# Patient Record
Sex: Female | Born: 1935
Health system: Southern US, Community
[De-identification: ages and names within clinical notes are randomized; demographics above are authoritative.]

## PROBLEM LIST (undated history)

## (undated) DIAGNOSIS — T8859XA Other complications of anesthesia, initial encounter: Secondary | ICD-10-CM

## (undated) DIAGNOSIS — Z9889 Other specified postprocedural states: Secondary | ICD-10-CM

## (undated) DIAGNOSIS — R112 Nausea with vomiting, unspecified: Secondary | ICD-10-CM

## (undated) DIAGNOSIS — M1990 Unspecified osteoarthritis, unspecified site: Secondary | ICD-10-CM

## (undated) DIAGNOSIS — N390 Urinary tract infection, site not specified: Secondary | ICD-10-CM

## (undated) DIAGNOSIS — K219 Gastro-esophageal reflux disease without esophagitis: Secondary | ICD-10-CM

## (undated) DIAGNOSIS — I10 Essential (primary) hypertension: Secondary | ICD-10-CM

## (undated) DIAGNOSIS — T4145XA Adverse effect of unspecified anesthetic, initial encounter: Secondary | ICD-10-CM

## (undated) DIAGNOSIS — C801 Malignant (primary) neoplasm, unspecified: Secondary | ICD-10-CM

## (undated) DIAGNOSIS — F419 Anxiety disorder, unspecified: Secondary | ICD-10-CM

## (undated) HISTORY — PX: APPENDECTOMY: SHX54

## (undated) HISTORY — PX: FOOT SURGERY: SHX648

## (undated) HISTORY — PX: ANKLE FRACTURE SURGERY: SHX122

---
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
## 1998-03-21 ENCOUNTER — Other Ambulatory Visit: Admission: RE | Admit: 1998-03-21 | Discharge: 1998-03-21 | Payer: Self-pay | Admitting: Gynecology

## 1998-08-13 HISTORY — PX: KNEE ARTHROSCOPY: SUR90

## 1999-03-28 ENCOUNTER — Other Ambulatory Visit: Admission: RE | Admit: 1999-03-28 | Discharge: 1999-03-28 | Payer: Self-pay | Admitting: Gynecology

## 2000-03-14 ENCOUNTER — Other Ambulatory Visit: Admission: RE | Admit: 2000-03-14 | Discharge: 2000-03-14 | Payer: Self-pay | Admitting: Gynecology

## 2000-04-09 ENCOUNTER — Encounter: Admission: RE | Admit: 2000-04-09 | Discharge: 2000-04-09 | Payer: Self-pay | Admitting: Gynecology

## 2000-04-09 ENCOUNTER — Encounter: Payer: Self-pay | Admitting: Gynecology

## 2001-04-10 ENCOUNTER — Other Ambulatory Visit: Admission: RE | Admit: 2001-04-10 | Discharge: 2001-04-10 | Payer: Self-pay | Admitting: Gynecology

## 2001-04-10 ENCOUNTER — Encounter: Admission: RE | Admit: 2001-04-10 | Discharge: 2001-04-10 | Payer: Self-pay | Admitting: Gynecology

## 2001-04-10 ENCOUNTER — Encounter: Payer: Self-pay | Admitting: Gynecology

## 2002-04-22 ENCOUNTER — Other Ambulatory Visit: Admission: RE | Admit: 2002-04-22 | Discharge: 2002-04-22 | Payer: Self-pay | Admitting: Gynecology

## 2002-04-22 ENCOUNTER — Encounter: Admission: RE | Admit: 2002-04-22 | Discharge: 2002-04-22 | Payer: Self-pay | Admitting: Gynecology

## 2002-04-22 ENCOUNTER — Encounter: Payer: Self-pay | Admitting: Gynecology

## 2002-12-06 ENCOUNTER — Inpatient Hospital Stay (HOSPITAL_COMMUNITY): Admission: EM | Admit: 2002-12-06 | Discharge: 2002-12-11 | Payer: Self-pay | Admitting: Emergency Medicine

## 2002-12-06 ENCOUNTER — Encounter: Payer: Self-pay | Admitting: Emergency Medicine

## 2002-12-07 ENCOUNTER — Encounter: Payer: Self-pay | Admitting: Thoracic Surgery (Cardiothoracic Vascular Surgery)

## 2002-12-07 ENCOUNTER — Encounter: Payer: Self-pay | Admitting: Surgery

## 2002-12-08 ENCOUNTER — Encounter: Payer: Self-pay | Admitting: Thoracic Surgery (Cardiothoracic Vascular Surgery)

## 2002-12-08 ENCOUNTER — Encounter: Payer: Self-pay | Admitting: Surgery

## 2002-12-09 ENCOUNTER — Encounter: Payer: Self-pay | Admitting: Surgery

## 2002-12-10 ENCOUNTER — Encounter: Payer: Self-pay | Admitting: Surgery

## 2002-12-29 ENCOUNTER — Encounter: Payer: Self-pay | Admitting: Surgery

## 2002-12-29 ENCOUNTER — Encounter: Admission: RE | Admit: 2002-12-29 | Discharge: 2002-12-29 | Payer: Self-pay | Admitting: Surgery

## 2003-04-26 ENCOUNTER — Encounter: Payer: Self-pay | Admitting: Gynecology

## 2003-04-26 ENCOUNTER — Encounter: Admission: RE | Admit: 2003-04-26 | Discharge: 2003-04-26 | Payer: Self-pay | Admitting: Gynecology

## 2004-04-26 ENCOUNTER — Encounter: Admission: RE | Admit: 2004-04-26 | Discharge: 2004-04-26 | Payer: Self-pay | Admitting: Gynecology

## 2004-05-18 ENCOUNTER — Other Ambulatory Visit: Admission: RE | Admit: 2004-05-18 | Discharge: 2004-05-18 | Payer: Self-pay | Admitting: Gynecology

## 2005-06-07 ENCOUNTER — Encounter: Admission: RE | Admit: 2005-06-07 | Discharge: 2005-06-07 | Payer: Self-pay | Admitting: Gynecology

## 2006-06-11 ENCOUNTER — Encounter: Admission: RE | Admit: 2006-06-11 | Discharge: 2006-06-11 | Payer: Self-pay | Admitting: Gynecology

## 2006-12-17 ENCOUNTER — Emergency Department (HOSPITAL_COMMUNITY): Admission: EM | Admit: 2006-12-17 | Discharge: 2006-12-17 | Payer: Self-pay | Admitting: Emergency Medicine

## 2007-06-16 ENCOUNTER — Encounter: Admission: RE | Admit: 2007-06-16 | Discharge: 2007-06-16 | Payer: Self-pay | Admitting: Gynecology

## 2007-06-23 ENCOUNTER — Other Ambulatory Visit: Admission: RE | Admit: 2007-06-23 | Discharge: 2007-06-23 | Payer: Self-pay | Admitting: Gynecology

## 2007-12-05 ENCOUNTER — Encounter: Admission: RE | Admit: 2007-12-05 | Discharge: 2007-12-05 | Payer: Self-pay | Admitting: Orthopedic Surgery

## 2007-12-09 ENCOUNTER — Ambulatory Visit (HOSPITAL_BASED_OUTPATIENT_CLINIC_OR_DEPARTMENT_OTHER): Admission: RE | Admit: 2007-12-09 | Discharge: 2007-12-09 | Payer: Self-pay | Admitting: Orthopedic Surgery

## 2008-10-20 ENCOUNTER — Encounter: Admission: RE | Admit: 2008-10-20 | Discharge: 2008-10-20 | Payer: Self-pay | Admitting: Gynecology

## 2008-12-13 ENCOUNTER — Encounter: Admission: RE | Admit: 2008-12-13 | Discharge: 2008-12-13 | Payer: Self-pay | Admitting: Orthopedic Surgery

## 2009-08-13 HISTORY — PX: BREAST SURGERY: SHX581

## 2009-10-28 ENCOUNTER — Encounter: Admission: RE | Admit: 2009-10-28 | Discharge: 2009-10-28 | Payer: Self-pay | Admitting: Internal Medicine

## 2009-11-02 ENCOUNTER — Encounter: Admission: RE | Admit: 2009-11-02 | Discharge: 2009-11-02 | Payer: Self-pay | Admitting: Internal Medicine

## 2010-04-20 ENCOUNTER — Encounter: Admission: RE | Admit: 2010-04-20 | Discharge: 2010-04-20 | Payer: Self-pay | Admitting: Internal Medicine

## 2010-05-11 ENCOUNTER — Encounter: Admission: RE | Admit: 2010-05-11 | Discharge: 2010-05-11 | Payer: Self-pay | Admitting: General Surgery

## 2010-05-15 ENCOUNTER — Ambulatory Visit (HOSPITAL_BASED_OUTPATIENT_CLINIC_OR_DEPARTMENT_OTHER): Admission: RE | Admit: 2010-05-15 | Discharge: 2010-05-15 | Payer: Self-pay | Admitting: General Surgery

## 2010-05-15 ENCOUNTER — Encounter: Admission: RE | Admit: 2010-05-15 | Discharge: 2010-05-15 | Payer: Self-pay | Admitting: General Surgery

## 2010-06-03 ENCOUNTER — Emergency Department (HOSPITAL_COMMUNITY): Admission: EM | Admit: 2010-06-03 | Discharge: 2010-06-03 | Payer: Self-pay | Admitting: Emergency Medicine

## 2010-10-25 LAB — URINALYSIS, ROUTINE W REFLEX MICROSCOPIC
Bilirubin Urine: NEGATIVE
Ketones, ur: NEGATIVE mg/dL
Nitrite: NEGATIVE
Urobilinogen, UA: 0.2 mg/dL (ref 0.0–1.0)

## 2010-10-25 LAB — CBC
HCT: 39 % (ref 36.0–46.0)
MCHC: 33.8 g/dL (ref 30.0–36.0)
MCV: 90.1 fL (ref 78.0–100.0)
RDW: 12.8 % (ref 11.5–15.5)

## 2010-10-25 LAB — POCT I-STAT, CHEM 8
BUN: 13 mg/dL (ref 6–23)
Calcium, Ion: 1.15 mmol/L (ref 1.12–1.32)
Chloride: 100 mEq/L (ref 96–112)
Glucose, Bld: 115 mg/dL — ABNORMAL HIGH (ref 70–99)

## 2010-10-25 LAB — DIFFERENTIAL
Basophils Absolute: 0 10*3/uL (ref 0.0–0.1)
Basophils Relative: 0 % (ref 0–1)
Eosinophils Relative: 1 % (ref 0–5)
Monocytes Absolute: 0.7 10*3/uL (ref 0.1–1.0)

## 2010-10-25 LAB — URINE MICROSCOPIC-ADD ON

## 2010-10-26 LAB — COMPREHENSIVE METABOLIC PANEL
ALT: 16 U/L (ref 0–35)
Alkaline Phosphatase: 38 U/L — ABNORMAL LOW (ref 39–117)
CO2: 25 mEq/L (ref 19–32)
Chloride: 108 mEq/L (ref 96–112)
GFR calc non Af Amer: 45 mL/min — ABNORMAL LOW (ref >60)
Glucose, Bld: 76 mg/dL (ref 70–99)
Potassium: 4.1 mEq/L (ref 3.5–5.1)
Sodium: 139 mEq/L (ref 135–145)
Total Bilirubin: 0.8 mg/dL (ref 0.3–1.2)

## 2010-10-26 LAB — URINALYSIS, ROUTINE W REFLEX MICROSCOPIC
Glucose, UA: NEGATIVE mg/dL
Ketones, ur: NEGATIVE mg/dL
pH: 6 (ref 5.0–8.0)

## 2010-10-26 LAB — CBC
HCT: 37.2 % (ref 36.0–46.0)
Hemoglobin: 12.6 g/dL (ref 12.0–15.0)
MCHC: 33.9 g/dL (ref 30.0–36.0)
RBC: 4.06 MIL/uL (ref 3.87–5.11)

## 2010-10-26 LAB — DIFFERENTIAL
Basophils Relative: 1 % (ref 0–1)
Eosinophils Absolute: 0.2 10*3/uL (ref 0.0–0.7)
Eosinophils Relative: 3 % (ref 0–5)
Neutrophils Relative %: 60 % (ref 43–77)

## 2010-11-06 ENCOUNTER — Other Ambulatory Visit: Payer: Self-pay | Admitting: Internal Medicine

## 2010-11-06 DIAGNOSIS — Z9889 Other specified postprocedural states: Secondary | ICD-10-CM

## 2010-11-06 DIAGNOSIS — Z1231 Encounter for screening mammogram for malignant neoplasm of breast: Secondary | ICD-10-CM

## 2010-11-21 ENCOUNTER — Ambulatory Visit
Admission: RE | Admit: 2010-11-21 | Discharge: 2010-11-21 | Disposition: A | Discharge status: Home / Self Care | Payer: Medicare Other | Source: Ambulatory Visit | Attending: Internal Medicine | Admitting: Internal Medicine

## 2010-11-21 DIAGNOSIS — Z1231 Encounter for screening mammogram for malignant neoplasm of breast: Secondary | ICD-10-CM

## 2010-12-26 NOTE — Op Note (Signed)
NAMESELENE, PELTZER                 ACCOUNT NO.:  [id number]   MEDICAL RECORD NO.:  [id number]          PATIENT TYPE:  AMB   LOCATION:  DSC                          FACILITY:  [facility]   PHYSICIAN:  Leonides Grills, M.D.     DATE OF BIRTH:  1933-03-13   DATE OF PROCEDURE:  12/09/2007  DATE OF DISCHARGE:                               OPERATIVE REPORT   PREOPERATIVE DIAGNOSIS:  Right hallux valgus.   POSTOPERATIVE DIAGNOSIS:  Right hallux valgus.   OPERATION:  1. Right Chevron bunionectomy.  2. Right great toe digital nerve neurolysis.  3. Stress x-rays, right foot.   ANESTHESIA:  General.   SURGEON:  Leonides Grills, MD   ASSISTANT:  Richardean Canal, PA-C   ESTIMATED BLOOD LOSS:  Minimal.   TOURNIQUET TIME:  Approximately half hour.   COMPLICATIONS:  None.   DISPOSITION:  Stable to PR.   INDICATIONS:  This is a 77 year old female who has had longstanding  right bunion pain that was interfering with her life to the point that  she cannot do what she wants to do.  She was discussed the above  procedure.  All risks of infection, neurovascular injury, nonunion,  malunion, hardware reduction or failure, stiffness, arthritis,  persistent pain, worsening pain, prolonged recovery, recurrence of  hallux valgus, and development of hallux varus were all explained.  Questions were encouraged and answered.   OPERATION:  The patient was brought to the operating room and placed in  the supine position.  After adequate general endotracheal tube  anesthesia was administered as well as Ancef 1 g IV piggyback.  Right  lower extremity was prepped and draped in sterile manner.  Over  proximally placed a thigh tourniquet, limbus gravity was exsanguinated,  it was elevated to 90 mmHg.  A longitudinal incision was made over the  medial aspect midline over the right great toe and MTP joint was then  made.  Dissection was carried down through the skin.  Hemostasis was  obtained.  The dorsomedial digital  nerve was carefully dissected out and  a formal digital nerve neurolysis was then performed.  Once this was  mobilized out of harm's way, a L-shaped capsulotomy was then made.  A  simple bunionectomy was then performed with a sagittal saw.  __________  Johnson's ridge was then rounded off with a rongeur.  Lateral capsule  was then released with curved Beaver blade.  This had excellent release  of tight capsule.  Soft tissue was then elevated and protected both  superiorly and inferiorly maintaining the dorsal blood supply to the  metatarsal head.  The center of the head was then identified and a  Chevron osteotomy was created with sagittal saw.  The head was then  translated approximately 3 mm laterally and fixed with a 2.0-mm fully  threaded cortical set screw and a 1.5-mm hole respectively.  This had  excellent purchase and maintenance of the correction.  This was  countersunk.  The redundant bone medially was then trimmed off with the  sagittal saw.  The joint area was copiously irrigated with normal  saline.  Capsule was reconstructed and advanced both superiorly and  proximally using 2-0 Vicryl stitch.  This had an Conservation officer, historic buildings.  The area was copiously irrigated with normal saline.  Tourniquet was  inflated.  Final stress x-ray obtained in AP and lateral planes showed  no gross motion, fixation of proper position with excellent alignment as  well.  Sesamoid was located and alignment was excellent.  The area was  copiously irrigated with normal saline.  Skin was closed with 4-0 nylon  stitch.  Sterile dressing was applied.  Roger-Mann dressing was applied.  Hard sole shoe was applied.  The patient was stable to the PR.      Leonides Grills, M.D.  Electronically Signed     PB/MEDQ  D:  12/09/2007  T:  12/09/2007  Job:  [id number]

## 2011-01-01 IMAGING — CR DG CHEST 2V
2 series · 2 of 2 positions shown · non-contrast
Comparison: 12/05/2007

CLINICAL DATA: Left breast mass, preop.  Hypertension.

CHEST - 2 VIEW

[view not recorded (1 of 2)]
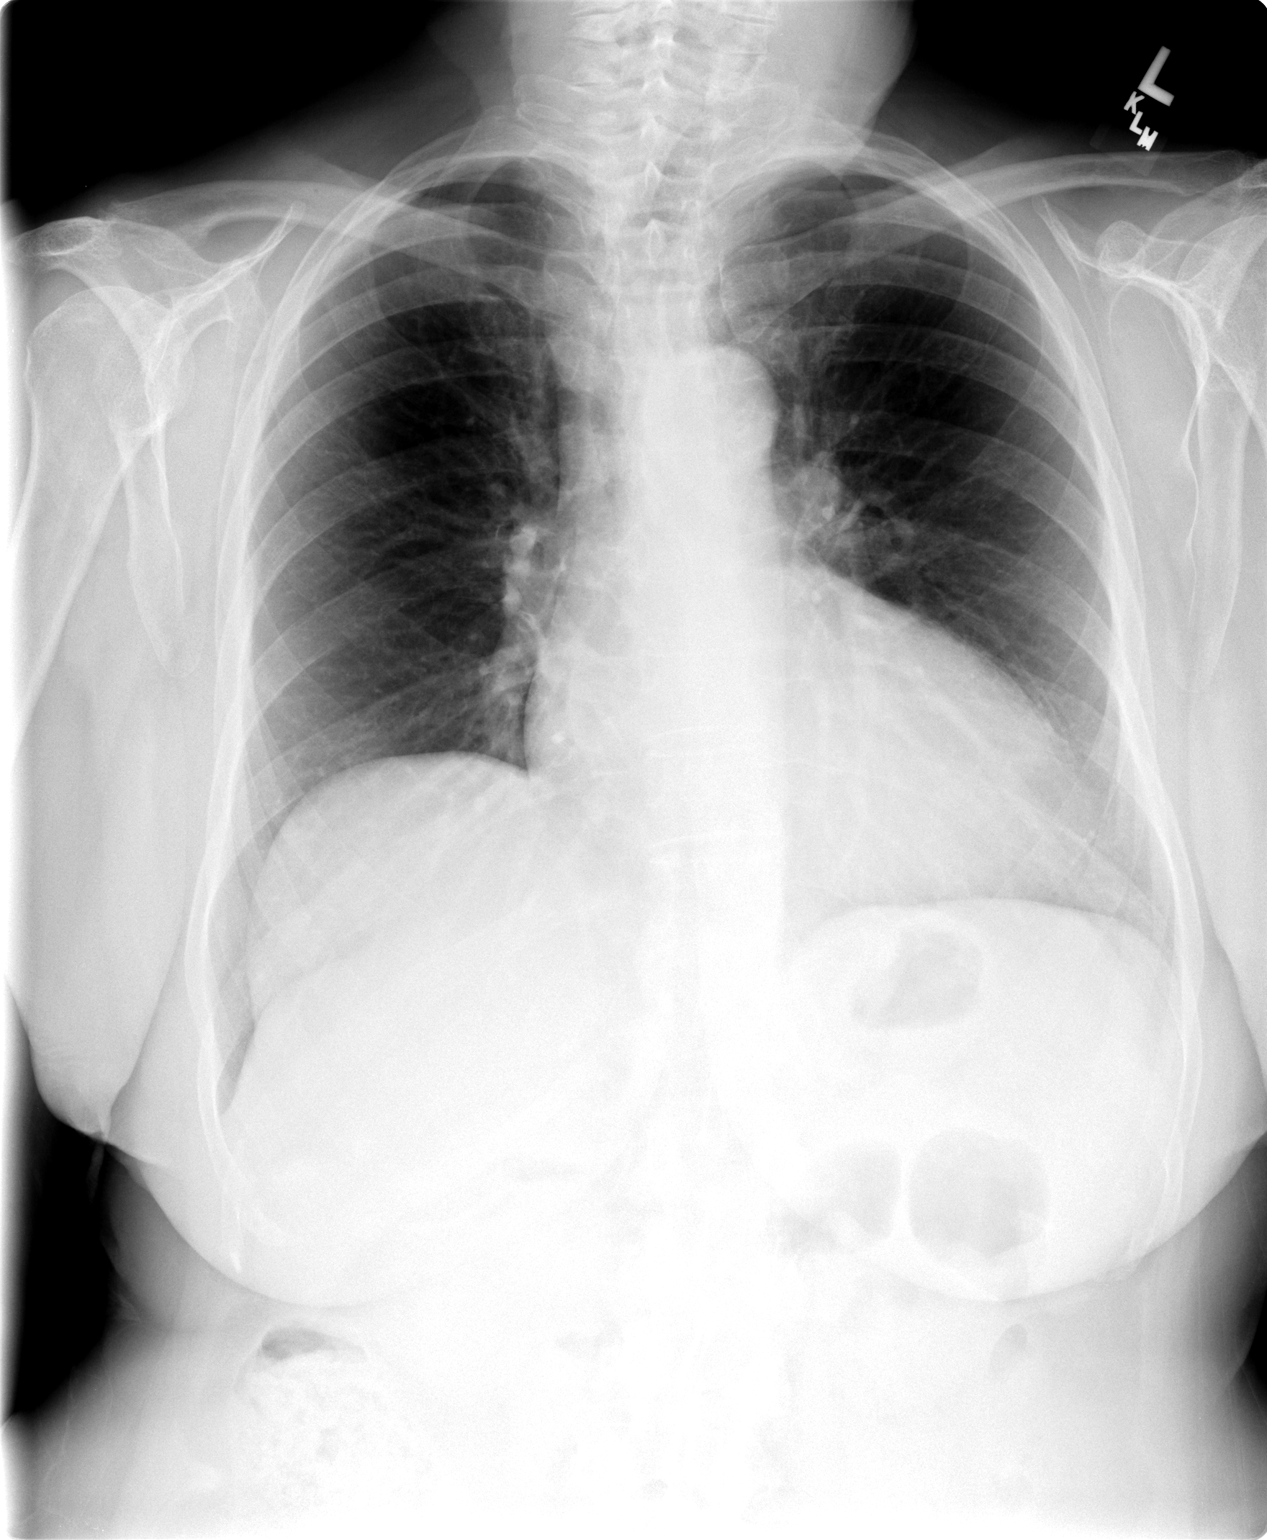

[view not recorded (2 of 2)]
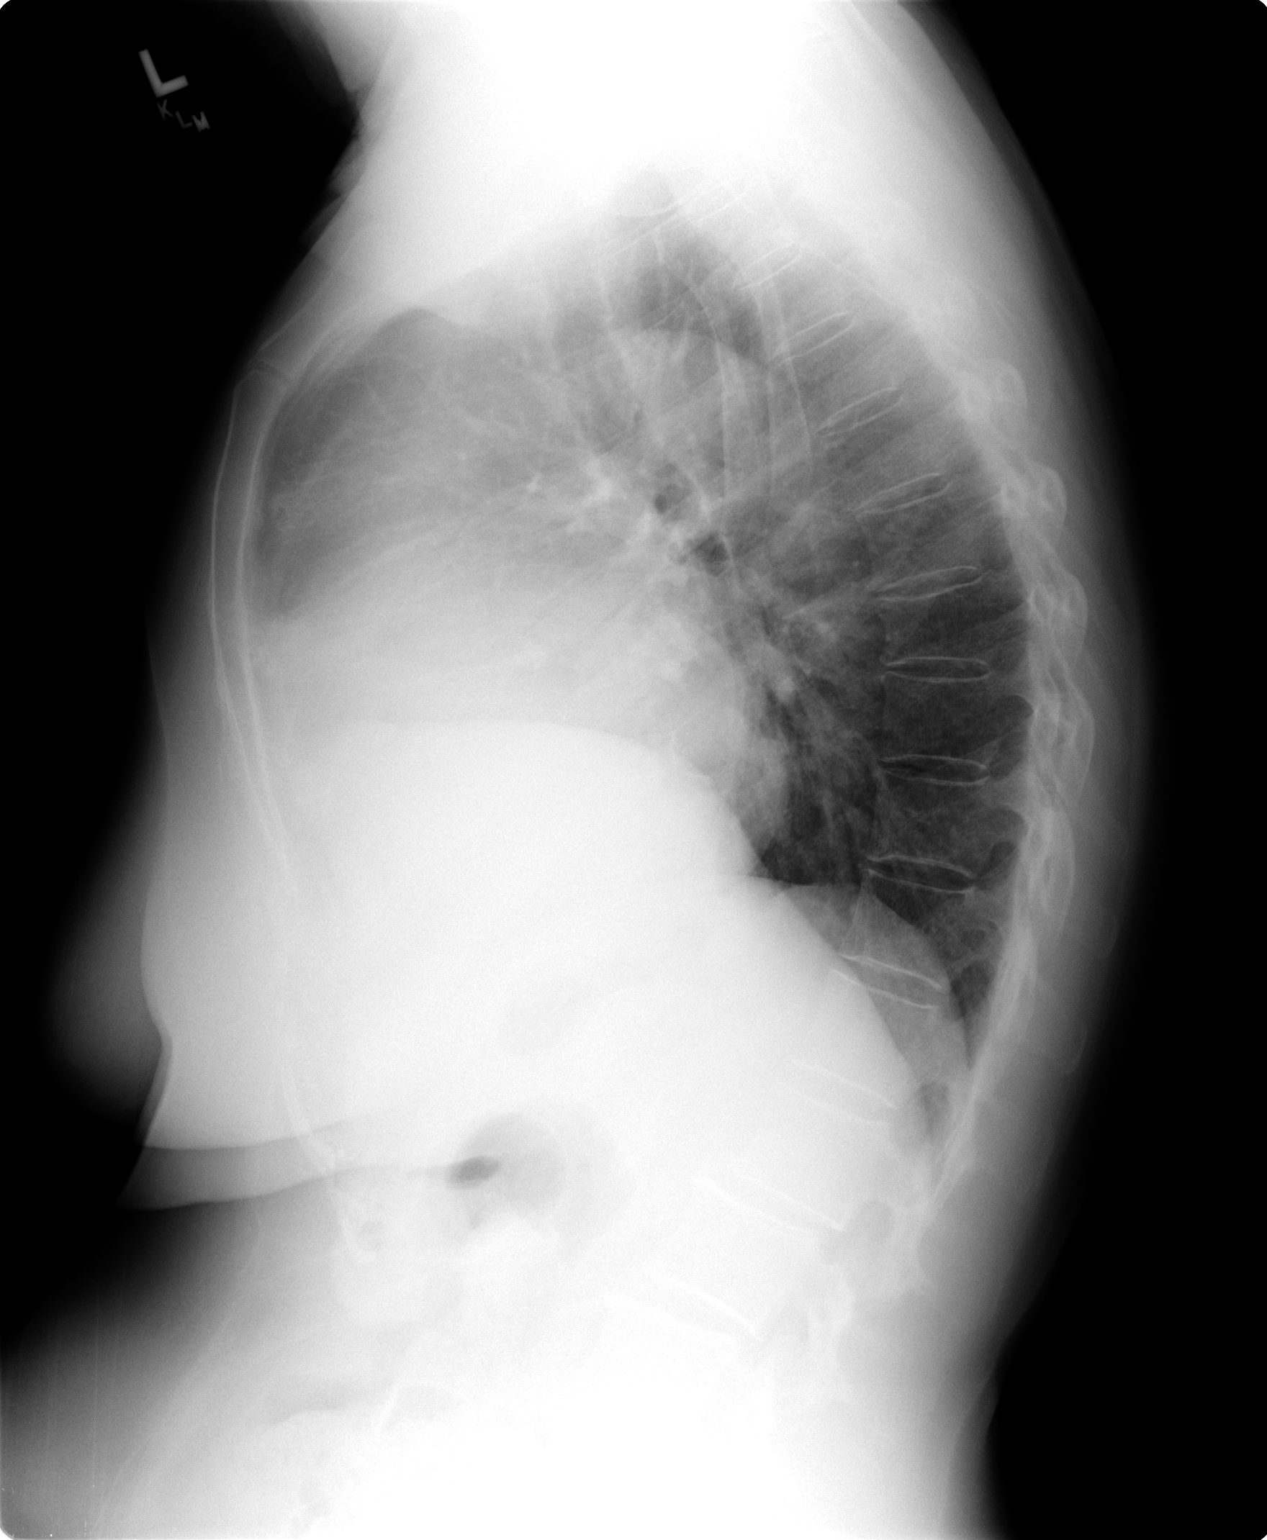

[2 of 2 positions shown; findings below may reference images not displayed]

FINDINGS: There is mild cardiomegaly.  Stable elevation of the
right hemidiaphragm.  Lungs are clear.  No effusions or acute bony
abnormality.
IMPRESSION: No acute cardiopulmonary disease.  Mild cardiomegaly.

## 2011-05-08 LAB — POCT I-STAT, CHEM 8
Calcium, Ion: 1 — ABNORMAL LOW
Chloride: 108
HCT: 41
Hemoglobin: 13.9

## 2011-05-08 LAB — POCT HEMOGLOBIN-HEMACUE: Hemoglobin: 13.6

## 2012-01-18 ENCOUNTER — Other Ambulatory Visit: Payer: Self-pay | Admitting: Internal Medicine

## 2012-01-18 DIAGNOSIS — Z1231 Encounter for screening mammogram for malignant neoplasm of breast: Secondary | ICD-10-CM

## 2012-01-30 ENCOUNTER — Ambulatory Visit
Admission: RE | Admit: 2012-01-30 | Discharge: 2012-01-30 | Disposition: A | Discharge status: Home / Self Care | Payer: Medicare Other | Source: Ambulatory Visit | Attending: Internal Medicine | Admitting: Internal Medicine

## 2012-01-30 DIAGNOSIS — Z1231 Encounter for screening mammogram for malignant neoplasm of breast: Secondary | ICD-10-CM

## 2013-01-14 ENCOUNTER — Other Ambulatory Visit: Payer: Self-pay

## 2013-01-14 DIAGNOSIS — Z1231 Encounter for screening mammogram for malignant neoplasm of breast: Secondary | ICD-10-CM

## 2013-02-12 ENCOUNTER — Ambulatory Visit
Admission: RE | Admit: 2013-02-12 | Discharge: 2013-02-12 | Disposition: A | Discharge status: Home / Self Care | Payer: Medicare Other | Source: Ambulatory Visit

## 2013-02-12 DIAGNOSIS — Z1231 Encounter for screening mammogram for malignant neoplasm of breast: Secondary | ICD-10-CM

## 2013-02-17 ENCOUNTER — Other Ambulatory Visit: Payer: Self-pay | Admitting: *Deleted

## 2013-02-17 ENCOUNTER — Other Ambulatory Visit: Payer: Self-pay | Admitting: Internal Medicine

## 2013-02-17 DIAGNOSIS — R928 Other abnormal and inconclusive findings on diagnostic imaging of breast: Secondary | ICD-10-CM

## 2013-02-25 ENCOUNTER — Ambulatory Visit
Admission: RE | Admit: 2013-02-25 | Discharge: 2013-02-25 | Disposition: A | Discharge status: Home / Self Care | Payer: Medicare Other | Source: Ambulatory Visit | Attending: Internal Medicine | Admitting: Internal Medicine

## 2013-02-25 DIAGNOSIS — R928 Other abnormal and inconclusive findings on diagnostic imaging of breast: Secondary | ICD-10-CM

## 2013-04-21 DIAGNOSIS — I10 Essential (primary) hypertension: Secondary | ICD-10-CM | POA: Insufficient documentation

## 2013-04-21 HISTORY — DX: Essential (primary) hypertension: I10

## 2013-05-18 ENCOUNTER — Other Ambulatory Visit: Payer: Self-pay | Admitting: Neurological Surgery

## 2013-05-25 ENCOUNTER — Encounter (HOSPITAL_COMMUNITY): Payer: Self-pay | Admitting: Pharmacy Technician

## 2013-05-27 ENCOUNTER — Ambulatory Visit (HOSPITAL_COMMUNITY)
Admission: RE | Admit: 2013-05-27 | Discharge: 2013-05-27 | Disposition: A | Discharge status: Home / Self Care | Payer: Medicare Other | Source: Ambulatory Visit | Attending: Neurological Surgery | Admitting: Neurological Surgery

## 2013-05-27 ENCOUNTER — Encounter (HOSPITAL_COMMUNITY)
Admission: RE | Admit: 2013-05-27 | Discharge: 2013-05-27 | Disposition: A | Discharge status: Home / Self Care | Payer: Medicare Other | Source: Ambulatory Visit | Attending: Neurological Surgery | Admitting: Neurological Surgery

## 2013-05-27 ENCOUNTER — Ambulatory Visit (HOSPITAL_COMMUNITY): Admission: RE | Admit: 2013-05-27 | Payer: Medicare Other | Source: Ambulatory Visit

## 2013-05-27 ENCOUNTER — Encounter (HOSPITAL_COMMUNITY): Payer: Self-pay

## 2013-05-27 DIAGNOSIS — I10 Essential (primary) hypertension: Secondary | ICD-10-CM | POA: Insufficient documentation

## 2013-05-27 DIAGNOSIS — Z01812 Encounter for preprocedural laboratory examination: Secondary | ICD-10-CM | POA: Insufficient documentation

## 2013-05-27 DIAGNOSIS — Z01818 Encounter for other preprocedural examination: Secondary | ICD-10-CM | POA: Insufficient documentation

## 2013-05-27 HISTORY — DX: Anxiety disorder, unspecified: F41.9

## 2013-05-27 HISTORY — DX: Essential (primary) hypertension: I10

## 2013-05-27 HISTORY — DX: Unspecified osteoarthritis, unspecified site: M19.90

## 2013-05-27 HISTORY — DX: Urinary tract infection, site not specified: N39.0

## 2013-05-27 HISTORY — DX: Malignant (primary) neoplasm, unspecified: C80.1

## 2013-05-27 LAB — CBC WITH DIFFERENTIAL/PLATELET
Basophils Absolute: 0 10*3/uL (ref 0.0–0.1)
Basophils Relative: 0 % (ref 0–1)
Hemoglobin: 13.2 g/dL (ref 12.0–15.0)
MCHC: 36 g/dL (ref 30.0–36.0)
Monocytes Relative: 8 % (ref 3–12)
Neutro Abs: 4.3 10*3/uL (ref 1.7–7.7)
Neutrophils Relative %: 58 % (ref 43–77)
Platelets: 171 10*3/uL (ref 150–400)
RDW: 12.2 % (ref 11.5–15.5)

## 2013-05-27 LAB — COMPREHENSIVE METABOLIC PANEL
ALT: 18 U/L (ref 0–35)
AST: 28 U/L (ref 0–37)
Albumin: 3.7 g/dL (ref 3.5–5.2)
Alkaline Phosphatase: 51 U/L (ref 39–117)
Chloride: 102 mEq/L (ref 96–112)
Potassium: 4.1 mEq/L (ref 3.5–5.1)
Sodium: 140 mEq/L (ref 135–145)
Total Bilirubin: 0.7 mg/dL (ref 0.3–1.2)
Total Protein: 7 g/dL (ref 6.0–8.3)

## 2013-05-27 NOTE — Pre-Procedure Instructions (Addendum)
TYLAH MANCILLAS  05/27/2013   Your procedure is scheduled on:  06/04/13  Report to [place] short stay admitting at530 AM.  Call this number if you have problems the morning of surgery: [phone]   Remember:   Do not eat food or drink liquids after midnight.   Take these medicines the morning of surgery with A SIP OF WATER:atenolol, pain med , xanax if needed                 STOP celebrex 05/30/13   Do not wear jewelry, make-up or nail polish.  Do not wear lotions, powders, or perfumes. You may wear deodorant.  Do not shave 48 hours prior to surgery. Men may shave face and neck.  Do not bring valuables to the hospital.  [facility] is not responsible                  for any belongings or valuables.               Contacts, dentures or bridgework may not be worn into surgery.  Leave suitcase in the car. After surgery it may be brought to your room.  For patients admitted to the hospital, discharge time is determined by your                treatment team.               Patients discharged the day of surgery will not be allowed to drive  home.  Name and phone number of your driver:   Special Instructions: Incentive Spirometry - Practice and bring it with you on the day of surgery. Shower using CHG 2 nights before surgery and the night before surgery.  If you shower the day of surgery use CHG.  Use special wash - you have one bottle of CHG for all showers.  You should use approximately 1/3 of the bottle for each shower.   Please read over the following fact sheets that you were given: Pain Booklet, Coughing and Deep Breathing, Blood Transfusion Information, MRSA Information and Surgical Site Infection Prevention

## 2013-05-28 NOTE — Progress Notes (Signed)
Anesthesia Chart Review:  Patient is a 79 year old female scheduled for L4-5 laminectomy on 06/04/13 by Dr. Yetta Barre.  History includes non-smoker, HTN, anxiety, arthritis, skin cancer, UTI. PCP is listed as Dr. Lindwood Qua.  EKG on 05/27/13 showed NSR, LAD, right BBB.  Right BBB is new since 05/11/10. No CV symptoms were documented at her PAT visit. She has no known MI, CHF, or DM history.  Preoperative CXR and labs noted.  She will be evaluated by her assigned anesthesiologist on the day of surgery, but if she remains asymptomatic from a CV standpoint then I would anticipate that she could proceed as planned.  Velna Ochs [facility] Short Stay Center/Anesthesiology Phone [phone] 05/28/2013 10:35 AM

## 2013-06-03 MED ORDER — VANCOMYCIN HCL IN DEXTROSE 1-5 GM/200ML-% IV SOLN
1000.0000 mg | INTRAVENOUS | Status: AC
  Administered 2013-06-04: 1000 mg via INTRAVENOUS

## 2013-06-03 MED ORDER — DEXAMETHASONE SODIUM PHOSPHATE 10 MG/ML IJ SOLN
10.0000 mg | INTRAMUSCULAR | Status: AC
  Administered 2013-06-04: 10 mg via INTRAVENOUS
  Filled 2013-06-03: qty 1

## 2013-06-04 ENCOUNTER — Inpatient Hospital Stay (HOSPITAL_COMMUNITY): Payer: Medicare Other

## 2013-06-04 ENCOUNTER — Inpatient Hospital Stay (HOSPITAL_COMMUNITY)
Admission: RE | Admit: 2013-06-04 | Discharge: 2013-06-05 | DRG: 520 | Disposition: A | Discharge status: Home / Self Care | Payer: Medicare Other | Source: Ambulatory Visit | Attending: Neurological Surgery | Admitting: Neurological Surgery

## 2013-06-04 ENCOUNTER — Encounter (HOSPITAL_COMMUNITY): Payer: Medicare Other | Admitting: Vascular Surgery

## 2013-06-04 ENCOUNTER — Encounter (HOSPITAL_COMMUNITY)
Admission: RE | Disposition: A | Discharge status: Home / Self Care | Payer: Self-pay | Source: Ambulatory Visit | Attending: Neurological Surgery

## 2013-06-04 ENCOUNTER — Inpatient Hospital Stay (HOSPITAL_COMMUNITY): Payer: Medicare Other | Admitting: Certified Registered Nurse Anesthetist

## 2013-06-04 ENCOUNTER — Encounter (HOSPITAL_COMMUNITY): Payer: Self-pay | Admitting: *Deleted

## 2013-06-04 DIAGNOSIS — I10 Essential (primary) hypertension: Secondary | ICD-10-CM | POA: Diagnosis present

## 2013-06-04 DIAGNOSIS — F411 Generalized anxiety disorder: Secondary | ICD-10-CM | POA: Diagnosis present

## 2013-06-04 DIAGNOSIS — I440 Atrioventricular block, first degree: Secondary | ICD-10-CM | POA: Diagnosis present

## 2013-06-04 DIAGNOSIS — Z9889 Other specified postprocedural states: Secondary | ICD-10-CM

## 2013-06-04 DIAGNOSIS — Z87440 Personal history of urinary (tract) infections: Secondary | ICD-10-CM

## 2013-06-04 DIAGNOSIS — Z880 Allergy status to penicillin: Secondary | ICD-10-CM

## 2013-06-04 DIAGNOSIS — G8929 Other chronic pain: Secondary | ICD-10-CM | POA: Diagnosis present

## 2013-06-04 DIAGNOSIS — Z79899 Other long term (current) drug therapy: Secondary | ICD-10-CM

## 2013-06-04 DIAGNOSIS — Z9089 Acquired absence of other organs: Secondary | ICD-10-CM

## 2013-06-04 DIAGNOSIS — M48061 Spinal stenosis, lumbar region without neurogenic claudication: Principal | ICD-10-CM | POA: Diagnosis present

## 2013-06-04 HISTORY — PX: LUMBAR LAMINECTOMY/DECOMPRESSION MICRODISCECTOMY: SHX5026

## 2013-06-04 SURGERY — LUMBAR LAMINECTOMY/DECOMPRESSION MICRODISCECTOMY 1 LEVEL
Anesthesia: General | Site: Back | Wound class: Clean

## 2013-06-04 MED ORDER — LACTATED RINGERS IV SOLN
INTRAVENOUS | Status: DC | PRN
  Administered 2013-06-04 (×2): via INTRAVENOUS

## 2013-06-04 MED ORDER — OXYCODONE HCL 5 MG PO TABS
5.0000 mg | ORAL_TABLET | Freq: Once | ORAL | Status: AC | PRN
  Administered 2013-06-04: 5 mg via ORAL

## 2013-06-04 MED ORDER — SCOPOLAMINE 1 MG/3DAYS TD PT72
MEDICATED_PATCH | TRANSDERMAL | Status: DC | PRN
  Administered 2013-06-04: 1 via TRANSDERMAL

## 2013-06-04 MED ORDER — DEXAMETHASONE SODIUM PHOSPHATE 4 MG/ML IJ SOLN
4.0000 mg | Freq: Four times a day (QID) | INTRAMUSCULAR | Status: DC
  Filled 2013-06-04 (×4): qty 1

## 2013-06-04 MED ORDER — PHENYLEPHRINE HCL 10 MG/ML IJ SOLN
INTRAMUSCULAR | Status: DC | PRN
  Administered 2013-06-04 (×2): 80 ug via INTRAVENOUS
  Administered 2013-06-04: 120 ug via INTRAVENOUS
  Administered 2013-06-04: 80 ug via INTRAVENOUS

## 2013-06-04 MED ORDER — MENTHOL 3 MG MT LOZG
1.0000 | LOZENGE | OROMUCOSAL | Status: DC | PRN
  Administered 2013-06-04: 3 mg via ORAL
  Filled 2013-06-04 (×2): qty 9

## 2013-06-04 MED ORDER — PHENYLEPHRINE HCL 10 MG/ML IJ SOLN
10.0000 mg | INTRAVENOUS | Status: DC | PRN
  Administered 2013-06-04: 10 ug/min via INTRAVENOUS

## 2013-06-04 MED ORDER — SODIUM CHLORIDE 0.9 % IV SOLN
INTRAVENOUS | Status: DC | PRN
  Administered 2013-06-04: 08:00:00 via INTRAVENOUS

## 2013-06-04 MED ORDER — ARTIFICIAL TEARS OP OINT
TOPICAL_OINTMENT | OPHTHALMIC | Status: DC | PRN
  Administered 2013-06-04: 1 via OPHTHALMIC

## 2013-06-04 MED ORDER — DEXAMETHASONE 4 MG PO TABS
4.0000 mg | ORAL_TABLET | Freq: Four times a day (QID) | ORAL | Status: DC
  Administered 2013-06-04 – 2013-06-05 (×4): 4 mg via ORAL
  Filled 2013-06-04 (×6): qty 1

## 2013-06-04 MED ORDER — OXYCODONE HCL 5 MG PO TABS
ORAL_TABLET | ORAL | Status: AC
  Filled 2013-06-04: qty 1

## 2013-06-04 MED ORDER — HYDROXYZINE HCL 10 MG PO TABS
10.0000 mg | ORAL_TABLET | Freq: Every day | ORAL | Status: DC | PRN
  Filled 2013-06-04: qty 1

## 2013-06-04 MED ORDER — HYDROMORPHONE HCL PF 1 MG/ML IJ SOLN
0.2500 mg | INTRAMUSCULAR | Status: DC | PRN
  Administered 2013-06-04 (×2): 0.5 mg via INTRAVENOUS

## 2013-06-04 MED ORDER — POTASSIUM CHLORIDE IN NACL 20-0.9 MEQ/L-% IV SOLN
INTRAVENOUS | Status: DC
  Filled 2013-06-04 (×3): qty 1000

## 2013-06-04 MED ORDER — NEOSTIGMINE METHYLSULFATE 1 MG/ML IJ SOLN
INTRAMUSCULAR | Status: DC | PRN
  Administered 2013-06-04: 3 mg via INTRAVENOUS

## 2013-06-04 MED ORDER — PROMETHAZINE HCL 25 MG/ML IJ SOLN
INTRAMUSCULAR | Status: AC
  Filled 2013-06-04: qty 1

## 2013-06-04 MED ORDER — MIDAZOLAM HCL 5 MG/5ML IJ SOLN
INTRAMUSCULAR | Status: DC | PRN
  Administered 2013-06-04: 2 mg via INTRAVENOUS

## 2013-06-04 MED ORDER — ALPRAZOLAM 0.25 MG PO TABS: 0.2500 mg | ORAL_TABLET | Freq: Every day | ORAL | Status: DC | PRN

## 2013-06-04 MED ORDER — PROMETHAZINE HCL 25 MG/ML IJ SOLN
6.2500 mg | INTRAMUSCULAR | Status: DC | PRN
  Administered 2013-06-04: 6.25 mg via INTRAVENOUS

## 2013-06-04 MED ORDER — HYDROCODONE-ACETAMINOPHEN 5-325 MG PO TABS
1.0000 | ORAL_TABLET | ORAL | Status: DC | PRN
  Administered 2013-06-04 – 2013-06-05 (×4): 1 via ORAL
  Filled 2013-06-04 (×4): qty 1

## 2013-06-04 MED ORDER — ACETAMINOPHEN 325 MG PO TABS: 650.0000 mg | ORAL_TABLET | ORAL | Status: DC | PRN

## 2013-06-04 MED ORDER — SODIUM CHLORIDE 0.9 % IJ SOLN: 3.0000 mL | INTRAMUSCULAR | Status: DC | PRN

## 2013-06-04 MED ORDER — PHENOL 1.4 % MT LIQD: 1.0000 | OROMUCOSAL | Status: DC | PRN

## 2013-06-04 MED ORDER — ONDANSETRON HCL 4 MG/2ML IJ SOLN: 4.0000 mg | INTRAMUSCULAR | Status: DC | PRN

## 2013-06-04 MED ORDER — ONDANSETRON HCL 4 MG/2ML IJ SOLN
INTRAMUSCULAR | Status: DC | PRN
  Administered 2013-06-04: 4 mg via INTRAVENOUS

## 2013-06-04 MED ORDER — ACETAMINOPHEN 650 MG RE SUPP: 650.0000 mg | RECTAL | Status: DC | PRN

## 2013-06-04 MED ORDER — SODIUM CHLORIDE 0.9 % IJ SOLN
3.0000 mL | Freq: Two times a day (BID) | INTRAMUSCULAR | Status: DC
  Administered 2013-06-04 (×2): 3 mL via INTRAVENOUS

## 2013-06-04 MED ORDER — GLYCOPYRROLATE 0.2 MG/ML IJ SOLN
INTRAMUSCULAR | Status: DC | PRN
  Administered 2013-06-04: .6 mg via INTRAVENOUS

## 2013-06-04 MED ORDER — HEMOSTATIC AGENTS (NO CHARGE) OPTIME
TOPICAL | Status: DC | PRN
  Administered 2013-06-04: 1 via TOPICAL

## 2013-06-04 MED ORDER — CELECOXIB 200 MG PO CAPS
200.0000 mg | ORAL_CAPSULE | Freq: Every day | ORAL | Status: DC
  Administered 2013-06-05: 200 mg via ORAL
  Filled 2013-06-04: qty 1

## 2013-06-04 MED ORDER — SENNA 8.6 MG PO TABS
1.0000 | ORAL_TABLET | Freq: Two times a day (BID) | ORAL | Status: DC
  Administered 2013-06-04 – 2013-06-05 (×2): 8.6 mg via ORAL
  Filled 2013-06-04 (×3): qty 1

## 2013-06-04 MED ORDER — TRIAMTERENE-HCTZ 37.5-25 MG PO TABS
1.0000 | ORAL_TABLET | Freq: Every day | ORAL | Status: DC
  Administered 2013-06-05: 1 via ORAL
  Filled 2013-06-04: qty 1

## 2013-06-04 MED ORDER — LIDOCAINE HCL (CARDIAC) 20 MG/ML IV SOLN
INTRAVENOUS | Status: DC | PRN
  Administered 2013-06-04: 40 mg via INTRAVENOUS

## 2013-06-04 MED ORDER — MIDAZOLAM HCL 2 MG/2ML IJ SOLN: 0.5000 mg | Freq: Once | INTRAMUSCULAR | Status: DC | PRN

## 2013-06-04 MED ORDER — SODIUM CHLORIDE 0.9 % IR SOLN
Status: DC | PRN
  Administered 2013-06-04: 08:00:00

## 2013-06-04 MED ORDER — ROCURONIUM BROMIDE 100 MG/10ML IV SOLN
INTRAVENOUS | Status: DC | PRN
  Administered 2013-06-04: 50 mg via INTRAVENOUS

## 2013-06-04 MED ORDER — MEPERIDINE HCL 25 MG/ML IJ SOLN: 6.2500 mg | INTRAMUSCULAR | Status: DC | PRN

## 2013-06-04 MED ORDER — ATENOLOL 25 MG PO TABS
25.0000 mg | ORAL_TABLET | Freq: Every day | ORAL | Status: DC
  Administered 2013-06-05: 25 mg via ORAL
  Filled 2013-06-04: qty 1

## 2013-06-04 MED ORDER — FENTANYL CITRATE 0.05 MG/ML IJ SOLN
INTRAMUSCULAR | Status: DC | PRN
  Administered 2013-06-04: 250 ug via INTRAVENOUS

## 2013-06-04 MED ORDER — THROMBIN 5000 UNITS EX SOLR
CUTANEOUS | Status: DC | PRN
  Administered 2013-06-04 (×2): 5000 [IU] via TOPICAL

## 2013-06-04 MED ORDER — 0.9 % SODIUM CHLORIDE (POUR BTL) OPTIME
TOPICAL | Status: DC | PRN
  Administered 2013-06-04: 1000 mL

## 2013-06-04 MED ORDER — PROPOFOL 10 MG/ML IV BOLUS
INTRAVENOUS | Status: DC | PRN
  Administered 2013-06-04: 10 mg via INTRAVENOUS
  Administered 2013-06-04: 5 mg via INTRAVENOUS

## 2013-06-04 MED ORDER — BUPIVACAINE HCL (PF) 0.25 % IJ SOLN
INTRAMUSCULAR | Status: DC | PRN
  Administered 2013-06-04: 4 mL

## 2013-06-04 MED ORDER — OXYCODONE HCL 5 MG/5ML PO SOLN: 5.0000 mg | Freq: Once | ORAL | Status: AC | PRN

## 2013-06-04 MED ORDER — HYDROMORPHONE HCL PF 1 MG/ML IJ SOLN
INTRAMUSCULAR | Status: AC
  Filled 2013-06-04: qty 1

## 2013-06-04 MED ORDER — SCOPOLAMINE 1 MG/3DAYS TD PT72
MEDICATED_PATCH | TRANSDERMAL | Status: AC
  Filled 2013-06-04: qty 1

## 2013-06-04 SURGICAL SUPPLY — 46 items
APL SKNCLS STERI-STRIP NONHPOA (GAUZE/BANDAGES/DRESSINGS) ×1
BAG DECANTER FOR FLEXI CONT (MISCELLANEOUS) ×2 IMPLANT
BENZOIN TINCTURE PRP APPL 2/3 (GAUZE/BANDAGES/DRESSINGS) ×2 IMPLANT
BUR MATCHSTICK NEURO 3.0 LAGG (BURR) ×2 IMPLANT
CANISTER SUCT 3000ML (MISCELLANEOUS) ×2 IMPLANT
CONT SPEC 4OZ CLIKSEAL STRL BL (MISCELLANEOUS) ×2 IMPLANT
DRAPE LAPAROTOMY 100X72X124 (DRAPES) ×2 IMPLANT
DRAPE MICROSCOPE ZEISS OPMI (DRAPES) ×2 IMPLANT
DRAPE POUCH INSTRU U-SHP 10X18 (DRAPES) ×2 IMPLANT
DRAPE SURG 17X23 STRL (DRAPES) ×2 IMPLANT
DRESSING TELFA 8X3 (GAUZE/BANDAGES/DRESSINGS) ×2 IMPLANT
DRSG OPSITE 4X5.5 SM (GAUZE/BANDAGES/DRESSINGS) ×2 IMPLANT
DRSG OPSITE POSTOP 4X6 (GAUZE/BANDAGES/DRESSINGS) ×1 IMPLANT
DURAPREP 26ML APPLICATOR (WOUND CARE) ×2 IMPLANT
ELECT REM PT RETURN 9FT ADLT (ELECTROSURGICAL) ×2
ELECTRODE REM PT RTRN 9FT ADLT (ELECTROSURGICAL) ×1 IMPLANT
GAUZE SPONGE 4X4 16PLY XRAY LF (GAUZE/BANDAGES/DRESSINGS) IMPLANT
GLOVE BIO SURGEON STRL SZ8 (GLOVE) ×2 IMPLANT
GLOVE BIOGEL PI IND STRL 7.5 (GLOVE) IMPLANT
GLOVE BIOGEL PI INDICATOR 7.5 (GLOVE) ×4
GLOVE SURG SS PI 7.0 STRL IVOR (GLOVE) ×4 IMPLANT
GOWN BRE IMP SLV AUR LG STRL (GOWN DISPOSABLE) IMPLANT
GOWN BRE IMP SLV AUR XL STRL (GOWN DISPOSABLE) ×4 IMPLANT
GOWN STRL REIN 2XL LVL4 (GOWN DISPOSABLE) IMPLANT
HEMOSTAT POWDER KIT SURGIFOAM (HEMOSTASIS) IMPLANT
KIT BASIN OR (CUSTOM PROCEDURE TRAY) ×2 IMPLANT
KIT ROOM TURNOVER OR (KITS) ×2 IMPLANT
NDL HYPO 25X1 1.5 SAFETY (NEEDLE) ×1 IMPLANT
NDL SPNL 20GX3.5 QUINCKE YW (NEEDLE) IMPLANT
NEEDLE HYPO 25X1 1.5 SAFETY (NEEDLE) ×2 IMPLANT
NEEDLE SPNL 20GX3.5 QUINCKE YW (NEEDLE) IMPLANT
NS IRRIG 1000ML POUR BTL (IV SOLUTION) ×2 IMPLANT
PACK LAMINECTOMY NEURO (CUSTOM PROCEDURE TRAY) ×2 IMPLANT
PAD ARMBOARD 7.5X6 YLW CONV (MISCELLANEOUS) ×6 IMPLANT
RUBBERBAND STERILE (MISCELLANEOUS) ×4 IMPLANT
SPONGE SURGIFOAM ABS GEL SZ50 (HEMOSTASIS) ×2 IMPLANT
STRIP CLOSURE SKIN 1/2X4 (GAUZE/BANDAGES/DRESSINGS) ×2 IMPLANT
SUT VIC AB 0 CT1 18XCR BRD8 (SUTURE) ×1 IMPLANT
SUT VIC AB 0 CT1 8-18 (SUTURE) ×2
SUT VIC AB 2-0 CP2 18 (SUTURE) ×2 IMPLANT
SUT VIC AB 3-0 SH 8-18 (SUTURE) ×2 IMPLANT
SYR 20ML ECCENTRIC (SYRINGE) ×2 IMPLANT
TAPE STRIPS DRAPE STRL (GAUZE/BANDAGES/DRESSINGS) ×1 IMPLANT
TOWEL OR 17X24 6PK STRL BLUE (TOWEL DISPOSABLE) ×2 IMPLANT
TOWEL OR 17X26 10 PK STRL BLUE (TOWEL DISPOSABLE) ×2 IMPLANT
WATER STERILE IRR 1000ML POUR (IV SOLUTION) ×2 IMPLANT

## 2013-06-04 NOTE — Anesthesia Postprocedure Evaluation (Signed)
  Anesthesia Post-op Note  Patient: Colleen Watkins  Procedure(s) Performed: Procedure(s) with comments: LUMBAR LAMINECTOMY/DECOMPRESSION MICRODISCECTOMY 1 LEVEL (N/A) - LUMBAR LAMINECTOMY/DECOMPRESSION MICRODISCECTOMY 1 LEVEL  Patient Location: PACU  Anesthesia Type:General  Level of Consciousness: oriented, sedated and responds to stimulation and voice  Airway and Oxygen Therapy: Patient Spontanous Breathing and Patient connected to nasal cannula oxygen  Post-op Pain: none  Post-op Assessment: Post-op Vital signs reviewed, Patient's Cardiovascular Status Stable, Respiratory Function Stable, Patent Airway and Pain level controlled, nausea improved  Post-op Vital Signs: Reviewed and stable  Complications: No apparent anesthesia complications

## 2013-06-04 NOTE — Progress Notes (Signed)
ANTIBIOTIC CONSULT NOTE - INITIAL  Pharmacy Consult for Vancomycin Indication: Post-op px x 1 dose  Allergies  Allergen Reactions  . Penicillins     Lip/ face tingling  . Codeine Rash    Patient Measurements: Height: 5\' 5"  (165.1 cm) Weight: 160 lb 2 oz (72.632 kg) IBW/kg (Calculated) : 57 Adjusted Body Weight:   Vital Signs: Temp: 97.6 F (36.4 C) (10/23 1250) Temp src: Oral (10/23 0605) BP: 145/78 mmHg (10/23 1250) Pulse Rate: 55 (10/23 1250) Intake/Output from previous day:   Intake/Output from this shift: Total I/O In: 1890 [P.O.:240; I.V.:1650] Out: 265 [Urine:250; Blood:15]  Labs: No results found for this basename: WBC, HGB, PLT, LABCREA, CREATININE,  in the last 72 hours Estimated Creatinine Clearance: 54.4 ml/min (by C-G formula based on Cr of 0.85). No results found for this basename: VANCOTROUGH, Leodis Binet, VANCORANDOM, GENTTROUGH, GENTPEAK, GENTRANDOM, TOBRATROUGH, TOBRAPEAK, TOBRARND, AMIKACINPEAK, AMIKACINTROU, AMIKACIN,  in the last 72 hours   Microbiology: Recent Results (from the past 720 hour(s))  SURGICAL PCR SCREEN     Status: None   Collection Time    05/27/13  2:46 PM      Result Value Range Status   MRSA, PCR NEGATIVE  NEGATIVE Final   Staphylococcus aureus NEGATIVE  NEGATIVE Final   Comment:            The Xpert SA Assay (FDA     approved for NASAL specimens     in patients over 77 years of age),     is one component of     a comprehensive surveillance     program.  Test performance has     been validated by [employer] for patients greater     than or equal to 29 year old.     It is not intended     to diagnose infection nor to     guide or monitor treatment.    Medical History: Past Medical History  Diagnosis Date  . Hypertension   . Anxiety   . UTI (lower urinary tract infection)   . Cancer     skin  . Arthritis     Medications:  Scheduled:  . [START ON 06/05/2013] atenolol  25 mg Oral Daily  . [START ON  06/05/2013] celecoxib  200 mg Oral Daily  . dexamethasone  4 mg Intravenous Q6H   Or  . dexamethasone  4 mg Oral Q6H  . HYDROmorphone      . oxyCODONE      . promethazine      . scopolamine      . senna  1 tablet Oral BID  . sodium chloride  3 mL Intravenous Q12H  . [START ON 06/05/2013] triamterene-hydrochlorothiazide  1 tablet Oral Daily   Assessment: 79yo female s/p Admit Complaint: S/P Decompressive lumbar laminectomy, medial facetectomy, and foraminotomies L4-5 bilaterally, to receive 1 dose of Vancomycin post-op.  She received Vancomycin 1000mg  IV x 1 this AM at 0745.  Given her CrCl ~81ml/min, this dose should cover her as she would not require another dose until the AM.    1.  No further Vancomycin dosing required  Marisue Humble, PharmD Clinical Pharmacist [facility]

## 2013-06-04 NOTE — Plan of Care (Signed)
Problem: Consults Goal: Diagnosis - Spinal Surgery Outcome: Completed/Met Date Met:  06/04/13 Lumbar Laminectomy (Complex)     

## 2013-06-04 NOTE — Op Note (Signed)
06/04/2013  8:50 AM  PATIENT:  Colleen Watkins  79 y.o. female  PRE-OPERATIVE DIAGNOSIS: Lumbar spinal stenosis L4-5  POST-OPERATIVE DIAGNOSIS:  Same  PROCEDURE:  Decompressive lumbar laminectomy, medial facetectomy, and foraminotomies L4-5 bilaterally  SURGEON:  Marikay Alar, MD  ASSISTANTS: none  ANESTHESIA:   General  EBL: Less than 25 ml  Total I/O In: 1200 [I.V.:1200] Out: 15 [Blood:15]  BLOOD ADMINISTERED:none  DRAINS: None   SPECIMEN:  No Specimen  INDICATION FOR PROCEDURE: Patient presented with a long history of back and bilateral leg pain. She had an MRI which showed moderate spinal stenosis at L4-5 left greater than right. He tried medical management without relief. Recommended a simple decompressive laminectomy at L4-5. Patient understood the risks, benefits, and alternatives and potential outcomes and wished to proceed.  PROCEDURE DETAILS: The patient was taken to the operating room and after induction of adequate generalized endotracheal anesthesia, the patient was rolled into the prone position on the Wilson frame and all pressure points were padded. The lumbar region was cleaned and then prepped with DuraPrep and draped in the usual sterile fashion. 5 cc of local anesthesia was injected and then a dorsal midline incision was made and carried down to the lumbo sacral fascia. The fascia was opened and the paraspinous musculature was taken down in a subperiosteal fashion to expose L4-5 on the left. Intraoperative x-ray confirmed my level, and then I used a combination of the high-speed drill and the Kerrison punches to perform a hemilaminectomy, medial facetectomy, and foraminotomy at L4-5 on the left. The underlying yellow ligament was opened and removed in a piecemeal fashion to expose the underlying dura and exiting nerve root. I undercut the lateral recess and dissected down until I was medial to and distal to the pedicle. The nerve root was well decompressed. I drilled  up under the spinous process and performed a sublaminar decompression to decompress the right lateral recess. I could visualize both L5 nerve root easily.  I then palpated with a coronary dilator along the nerve root bilaterally and into the foramen to assure adequate decompression. I felt no more compression of the nerve root on either side. I irrigated with saline solution containing bacitracin. Achieved hemostasis with bipolar cautery, lined the dura with Gelfoam, and then closed the fascia with 0 Vicryl. I closed the subcutaneous tissues with 2-0 Vicryl and the subcuticular tissues with 3-0 Vicryl. The skin was then closed with benzoin and Steri-Strips. The drapes were removed, a sterile dressing was applied. The patient was awakened from general anesthesia and transferred to the recovery room in stable condition. At the end of the procedure all sponge, needle and instrument counts were correct.   PLAN OF CARE: Admit to inpatient   PATIENT DISPOSITION:  PACU - hemodynamically stable.   Delay start of Pharmacological VTE agent (>24hrs) due to surgical blood loss or risk of bleeding:  yes

## 2013-06-04 NOTE — Preoperative (Signed)
Beta Blockers  Tenormin 25mg s taken @ 4:10 06-04-13

## 2013-06-04 NOTE — Anesthesia Preprocedure Evaluation (Addendum)
Anesthesia Evaluation  Patient identified by MRN, date of birth, ID band Patient awake    Reviewed: Allergy & Precautions, H&P , NPO status , Patient's Chart, lab work & pertinent test results  History of Anesthesia Complications (+) PONV and history of anesthetic complications  Airway Mallampati: II TM Distance: >3 FB Neck ROM: Full    Dental  (+) Teeth Intact and Dental Advisory Given   Pulmonary neg pulmonary ROS,  05-27-13 Chest x-ray  FINDINGS: The heart size and mediastinal contours are within normal limits. Both lungs are clear. The visualized skeletal structures are unremarkable breath sounds clear to auscultation  Pulmonary exam normal       Cardiovascular hypertension, Pt. on medications and Pt. on home beta blockers - anginaRhythm:Regular Rate:Normal  27-May-2013 14:53:12 [facility] ROUTINE RECORD Sinus rhythm with 1st degree A-V block Left axis deviation Right bundle branch block Abnormal ECG RBBB new since 05/11/10   Neuro/Psych Anxiety Chronic back pain: narcotics    GI/Hepatic negative GI ROS, Neg liver ROS,   Endo/Other  negative endocrine ROS  Renal/GU negative Renal ROS     Musculoskeletal   Abdominal   Peds  Hematology negative hematology ROS (+)   Anesthesia Other Findings   Reproductive/Obstetrics                      Anesthesia Physical Anesthesia Plan  ASA: II  Anesthesia Plan: General   Post-op Pain Management:    Induction: Intravenous  Airway Management Planned: Oral ETT  Additional Equipment:   Intra-op Plan:   Post-operative Plan: Extubation in OR  Informed Consent: I have reviewed the patients History and Physical, chart, labs and discussed the procedure including the risks, benefits and alternatives for the proposed anesthesia with the patient or authorized representative who has indicated his/her understanding and acceptance.    Dental advisory given  Plan Discussed with: CRNA and Surgeon  Anesthesia Plan Comments: (Plan routine monitors, GETA)        Anesthesia Quick Evaluation

## 2013-06-04 NOTE — Anesthesia Procedure Notes (Signed)
Procedure Name: Intubation Date/Time: 06/04/2013 7:57 AM Performed by: Tyrone Nine Pre-anesthesia Checklist: Suction available, Patient being monitored, Emergency Drugs available, Patient identified and Timeout performed Patient Re-evaluated:Patient Re-evaluated prior to inductionOxygen Delivery Method: Circle system utilized Preoxygenation: Pre-oxygenation with 100% oxygen Intubation Type: IV induction Ventilation: Mask ventilation without difficulty and Oral airway inserted - appropriate to patient size Laryngoscope Size: Mac and 3 Grade View: Grade III Tube type: Oral Tube size: 7.5 mm Number of attempts: 2 Airway Equipment and Method: Stylet Placement Confirmation: ETT inserted through vocal cords under direct vision,  breath sounds checked- equal and bilateral,  positive ETCO2 and CO2 detector Secured at: 22 cm Tube secured with: Tape Dental Injury: Teeth and Oropharynx as per pre-operative assessment  Difficulty Due To: Difficult Airway- due to anterior larynx and Difficult Airway- due to limited oral opening

## 2013-06-04 NOTE — H&P (Signed)
Subjective: Patient is a 79 y.o. female admitted for LL L4-5. Onset of symptoms was several months ago, gradually worsening since that time.  The pain is rated severe, and is located at the across the lower back and radiates to legs R>L. The pain is described as aching and occurs intermittently. The symptoms have been progressive. Symptoms are exacerbated by exercise. MRI or CT showed stenosis L4-5.   Past Medical History  Diagnosis Date  . Hypertension   . Anxiety   . UTI (lower urinary tract infection)   . Cancer     skin  . Arthritis     Past Surgical History  Procedure Laterality Date  . Laparotomy  50    intestinal tortion  . Ankle fracture surgery Right     25 yrs ago  . Foot surgery      bunion 20 yrs ago  . Knee arthroscopy Left 2000  . Appendectomy    . Breast surgery Left 2011    removed nodule-benign    Prior to Admission medications   Medication Sig Start Date End Date Taking? Authorizing Provider  ALPRAZolam Prudy Feeler) 0.5 MG tablet Take 0.25 mg by mouth daily as needed for anxiety.   Yes Historical Provider, MD  atenolol (TENORMIN) 25 MG tablet Take 25 mg by mouth daily.   Yes Historical Provider, MD  bisacodyl (DULCOLAX) 5 MG EC tablet Take 5 mg by mouth daily as needed for constipation.   Yes Historical Provider, MD  celecoxib (CELEBREX) 200 MG capsule Take 200 mg by mouth daily.   Yes Historical Provider, MD  Cholecalciferol (VITAMIN D PO) Take 723 mg by mouth 3 (three) times a week. Monday, Wednesday, and friday   Yes Historical Provider, MD  HYDROcodone-acetaminophen (NORCO/VICODIN) 5-325 MG per tablet Take 0.5 tablets by mouth every 6 (six) hours as needed for pain.   Yes Historical Provider, MD  hydrocortisone (ANUSOL-HC) 25 MG suppository Place 25 mg rectally 2 (two) times daily as needed for hemorrhoids.   Yes Historical Provider, MD  triamterene-hydrochlorothiazide (MAXZIDE-25) 37.5-25 MG per tablet Take 1 tablet by mouth daily.   Yes Historical Provider, MD   hydrOXYzine (ATARAX/VISTARIL) 10 MG tablet Take 10 mg by mouth daily as needed for itching.    Historical Provider, MD  promethazine (PHENERGAN) 25 MG tablet Take 25 mg by mouth every 6 (six) hours as needed for nausea.    Historical Provider, MD   Allergies  Allergen Reactions  . Penicillins     Lip/ face tingling  . Codeine Rash    History  Substance Use Topics  . Smoking status: Never Smoker   . Smokeless tobacco: Not on file  . Alcohol Use: No    History reviewed. No pertinent family history.   Review of Systems  Positive ROS: neg  All other systems have been reviewed and were otherwise negative with the exception of those mentioned in the HPI and as above.  Objective: Vital signs in last 24 hours: Temp:  [98.2 F (36.8 C)] 98.2 F (36.8 C) (10/23 0605) Pulse Rate:  [64] 64 (10/23 0605) Resp:  [16] 16 (10/23 0605) BP: (193)/(71) 193/71 mmHg (10/23 0605) SpO2:  [98 %] 98 % (10/23 0605)  General Appearance: Alert, cooperative, no distress, appears stated age Head: Normocephalic, without obvious abnormality, atraumatic Eyes: PERRL, conjunctiva/corneas clear, EOM's intact    Neck: Supple, symmetrical, trachea midline Back: Symmetric, no curvature, ROM normal, no CVA tenderness Lungs:  respirations unlabored Heart: Regular rate and rhythm Abdomen: Soft, non-tender Extremities:  Extremities normal, atraumatic, no cyanosis or edema Pulses: 2+ and symmetric all extremities Skin: Skin color, texture, turgor normal, no rashes or lesions  NEUROLOGIC:   Mental status: Alert and oriented x4,  no aphasia, good attention span, fund of knowledge, and memory Motor Exam - grossly normal Sensory Exam - grossly normal Reflexes: 1+ Coordination - grossly normal Gait - grossly normal Balance - grossly normal Cranial Nerves: I: smell Not tested  II: visual acuity  OS: nl    OD: nl  II: visual fields Full to confrontation  II: pupils Equal, round, reactive to light  III,VII:  ptosis None  III,IV,VI: extraocular muscles  Full ROM  V: mastication Normal  V: facial light touch sensation  Normal  V,VII: corneal reflex  Present  VII: facial muscle function - upper  Normal  VII: facial muscle function - lower Normal  VIII: hearing Not tested  IX: soft palate elevation  Normal  IX,X: gag reflex Present  XI: trapezius strength  5/5  XI: sternocleidomastoid strength 5/5  XI: neck flexion strength  5/5  XII: tongue strength  Normal    Data Review Lab Results  Component Value Date   WBC 7.5 05/27/2013   HGB 13.2 05/27/2013   HCT 36.7 05/27/2013   MCV 91.8 05/27/2013   PLT 171 05/27/2013   Lab Results  Component Value Date   NA 140 05/27/2013   K 4.1 05/27/2013   CL 102 05/27/2013   CO2 29 05/27/2013   BUN 20 05/27/2013   CREATININE 0.85 05/27/2013   GLUCOSE 90 05/27/2013   No results found for this basename: INR, PROTIME    Assessment/Plan: Patient admitted for LL L4-5 for stenosis. Patient has failed a reasonable attempt at conservative therapy.  I explained the condition and procedure to the patient and answered any questions.  Patient wishes to proceed with procedure as planned. Understands risks/ benefits and typical outcomes of procedure.   Koda Routon S 06/04/2013 7:40 AM

## 2013-06-04 NOTE — OR Nursing (Signed)
Following end of procedure, in and out catheter placed due to urinary retention. Approximately 250 cc clear, straw colored urine drained. Catheterization performed by Jeralyn Ruths RN.

## 2013-06-04 NOTE — Transfer of Care (Signed)
Immediate Anesthesia Transfer of Care Note  Patient: Colleen Watkins  Procedure(s) Performed: Procedure(s) with comments: LUMBAR LAMINECTOMY/DECOMPRESSION MICRODISCECTOMY 1 LEVEL (N/A) - LUMBAR LAMINECTOMY/DECOMPRESSION MICRODISCECTOMY 1 LEVEL  Patient Location: PACU  Anesthesia Type:General  Level of Consciousness: awake, alert , oriented and patient cooperative  Airway & Oxygen Therapy: Patient Spontanous Breathing and Patient connected to nasal cannula oxygen  Post-op Assessment: Report given to PACU RN and Post -op Vital signs reviewed and stable  Post vital signs: Reviewed and stable  Complications: No apparent anesthesia complications

## 2013-06-05 ENCOUNTER — Encounter (HOSPITAL_COMMUNITY): Payer: Self-pay | Admitting: Neurological Surgery

## 2013-06-05 MED ORDER — HYDROCODONE-ACETAMINOPHEN 5-325 MG PO TABS: 0.5000 | ORAL_TABLET | ORAL | Status: DC | PRN

## 2013-06-05 MED ORDER — VANCOMYCIN HCL IN DEXTROSE 1-5 GM/200ML-% IV SOLN
1000.0000 mg | Freq: Once | INTRAVENOUS | Status: DC
  Filled 2013-06-05: qty 200

## 2013-06-05 NOTE — Progress Notes (Signed)
Pt. Alert and oriented,follows simple instructions, denies pain. Incision area without swelling, redness or S/S of infection. Voiding adequate clear yellow urine. Moving all extremities well and vitals stable and documented. Patient discharged home with family.  Lumbar surgery notes instructions given to patient and family member for home safety and precautions. Pt. and family stated understanding of instructions given 

## 2013-06-05 NOTE — Discharge Summary (Signed)
Physician Discharge Summary  Patient ID: Colleen Watkins MRN: [id number] DOB/AGE: 1933/04/08 79 y.o.  Admit date: 06/04/2013 Discharge date: 06/05/2013  Admission Diagnoses: lumbar stenosis    Discharge Diagnoses: same   Discharged Condition: good  Hospital Course: The patient was admitted on 06/04/2013 and taken to the operating room where the patient underwent lumbar lam L4-5. The patient tolerated the procedure well and was taken to the recovery room and then to the floor in stable condition. The hospital course was routine. There were no complications. The wound remained clean dry and intact. Pt had appropriate back soreness. No complaints of leg pain or new N/T/W. The patient remained afebrile with stable vital signs, and tolerated a regular diet. The patient continued to increase activities, and pain was well controlled with oral pain medications.   Consults: None  Significant Diagnostic Studies:  Results for orders placed during the hospital encounter of 05/27/13  SURGICAL PCR SCREEN      Result Value Range   MRSA, PCR NEGATIVE  NEGATIVE   Staphylococcus aureus NEGATIVE  NEGATIVE  CBC WITH DIFFERENTIAL      Result Value Range   WBC 7.5  4.0 - 10.5 K/uL   RBC 4.00  3.87 - 5.11 MIL/uL   Hemoglobin 13.2  12.0 - 15.0 g/dL   HCT 40.9  81.1 - 91.4 %   MCV 91.8  78.0 - 100.0 fL   MCH 33.0  26.0 - 34.0 pg   MCHC 36.0  30.0 - 36.0 g/dL   RDW 78.2  95.6 - 21.3 %   Platelets 171  150 - 400 K/uL   Neutrophils Relative % 58  43 - 77 %   Neutro Abs 4.3  1.7 - 7.7 K/uL   Lymphocytes Relative 30  12 - 46 %   Lymphs Abs 2.3  0.7 - 4.0 K/uL   Monocytes Relative 8  3 - 12 %   Monocytes Absolute 0.6  0.1 - 1.0 K/uL   Eosinophils Relative 3  0 - 5 %   Eosinophils Absolute 0.3  0.0 - 0.7 K/uL   Basophils Relative 0  0 - 1 %   Basophils Absolute 0.0  0.0 - 0.1 K/uL  COMPREHENSIVE METABOLIC PANEL      Result Value Range   Sodium 140  135 - 145 mEq/L   Potassium 4.1  3.5 - 5.1 mEq/L    Chloride 102  96 - 112 mEq/L   CO2 29  19 - 32 mEq/L   Glucose, Bld 90  70 - 99 mg/dL   BUN 20  6 - 23 mg/dL   Creatinine, Ser 0.86  0.50 - 1.10 mg/dL   Calcium 9.9  8.4 - 57.8 mg/dL   Total Protein 7.0  6.0 - 8.3 g/dL   Albumin 3.7  3.5 - 5.2 g/dL   AST 28  0 - 37 U/L   ALT 18  0 - 35 U/L   Alkaline Phosphatase 51  39 - 117 U/L   Total Bilirubin 0.7  0.3 - 1.2 mg/dL   GFR calc non Af Amer 64 (*) >90 mL/min   GFR calc Af Amer 74 (*) >90 mL/min    Dg Chest 2 View  05/27/2013   CLINICAL DATA:  Hypertension.  EXAM: CHEST  2 VIEW  COMPARISON:  May 11, 2010.  FINDINGS: The heart size and mediastinal contours are within normal limits. Both lungs are clear. The visualized skeletal structures are unremarkable.  IMPRESSION: No active cardiopulmonary disease.   Electronically  Signed   By: Roque Lias M.D.   On: 05/27/2013 15:25   Dg Lumbar Spine 1 View  06/04/2013   CLINICAL DATA:  Surgical localization portable lateral lumbar spine view  EXAM: LUMBAR SPINE - 1 VIEW  COMPARISON:  05/18/2013  FINDINGS: A surgical probe has been inserted between skin retractors. The tip lies posterior to the upper endplate of L4.  IMPRESSION: Surgical localization image. Surgical probe tip lies posterior to the upper endplate of L4.   Electronically Signed   By: Amie Portland M.D.   On: 06/04/2013 10:51    Antibiotics:  Anti-infectives   Start     Dose/Rate Route Frequency Ordered Stop   06/05/13 0230  vancomycin (VANCOCIN) IVPB 1000 mg/200 mL premix  Status:  Discontinued     1,000 mg 200 mL/hr over 60 Minutes Intravenous  Once 06/05/13 0225 06/05/13 0226   06/04/13 0821  bacitracin 50,000 Units in sodium chloride irrigation 0.9 % 500 mL irrigation  Status:  Discontinued       As needed 06/04/13 0821 06/04/13 0911   06/04/13 0600  vancomycin (VANCOCIN) IVPB 1000 mg/200 mL premix     1,000 mg 200 mL/hr over 60 Minutes Intravenous 60 min pre-op 06/03/13 1433 06/04/13 0745      Discharge  Exam: Blood pressure 137/67, pulse 62, temperature 98.6 F (37 C), temperature source Oral, resp. rate 18, height 5\' 5"  (1.651 m), weight 72.632 kg (160 lb 2 oz), SpO2 92.00%. Neurologic: Grossly normal Incision CDI  Discharge Medications:     Medication List         ALPRAZolam 0.5 MG tablet  Commonly known as:  XANAX  Take 0.25 mg by mouth daily as needed for anxiety.     atenolol 25 MG tablet  Commonly known as:  TENORMIN  Take 25 mg by mouth daily.     bisacodyl 5 MG EC tablet  Commonly known as:  DULCOLAX  Take 5 mg by mouth daily as needed for constipation.     celecoxib 200 MG capsule  Commonly known as:  CELEBREX  Take 200 mg by mouth daily.     HYDROcodone-acetaminophen 5-325 MG per tablet  Commonly known as:  NORCO/VICODIN  Take 0.5 tablets by mouth every 4 (four) hours as needed for pain.     hydrocortisone 25 MG suppository  Commonly known as:  ANUSOL-HC  Place 25 mg rectally 2 (two) times daily as needed for hemorrhoids.     hydrOXYzine 10 MG tablet  Commonly known as:  ATARAX/VISTARIL  Take 10 mg by mouth daily as needed for itching.     promethazine 25 MG tablet  Commonly known as:  PHENERGAN  Take 25 mg by mouth every 6 (six) hours as needed for nausea.     triamterene-hydrochlorothiazide 37.5-25 MG per tablet  Commonly known as:  MAXZIDE-25  Take 1 tablet by mouth daily.     VITAMIN D PO  Take 723 mg by mouth 3 (three) times a week. Monday, Wednesday, and friday        Disposition: home   Final Dx: LL L4-5      Discharge Orders   Future Orders Complete By Expires   Call MD for:  difficulty breathing, headache or visual disturbances  As directed    Call MD for:  persistant nausea and vomiting  As directed    Call MD for:  redness, tenderness, or signs of infection (pain, swelling, redness, odor or green/yellow discharge around incision site)  As directed  Call MD for:  severe uncontrolled pain  As directed    Call MD for:  temperature  >100.4  As directed    Diet - low sodium heart healthy  As directed    Discharge instructions  As directed    Comments:     No bending or twisting, no heavy lifting   Increase activity slowly  As directed    Remove dressing in 48 hours  As directed       Follow-up Information   Follow up with Sheryl Towell S, MD. Schedule an appointment as soon as possible for a visit in 2 weeks.   Specialty:  Neurosurgery   Contact information:   [address] [place] [address] [phone]        Signed: Tia Alert 06/05/2013, 9:45 AM

## 2013-10-22 ENCOUNTER — Ambulatory Visit: Payer: Self-pay

## 2013-11-05 ENCOUNTER — Ambulatory Visit: Payer: Self-pay

## 2014-01-26 ENCOUNTER — Other Ambulatory Visit: Payer: Self-pay

## 2014-01-26 DIAGNOSIS — Z1231 Encounter for screening mammogram for malignant neoplasm of breast: Secondary | ICD-10-CM

## 2014-02-22 ENCOUNTER — Ambulatory Visit
Admission: RE | Admit: 2014-02-22 | Discharge: 2014-02-22 | Disposition: A | Discharge status: Home / Self Care | Payer: 59 | Source: Ambulatory Visit

## 2014-02-22 ENCOUNTER — Encounter (INDEPENDENT_AMBULATORY_CARE_PROVIDER_SITE_OTHER): Payer: Self-pay

## 2014-02-22 DIAGNOSIS — Z1231 Encounter for screening mammogram for malignant neoplasm of breast: Secondary | ICD-10-CM

## 2015-01-14 ENCOUNTER — Encounter (HOSPITAL_COMMUNITY): Payer: Self-pay | Admitting: *Deleted

## 2015-01-14 ENCOUNTER — Emergency Department (HOSPITAL_COMMUNITY)
Admission: EM | Admit: 2015-01-14 | Discharge: 2015-01-14 | Disposition: A | Discharge status: Home / Self Care | Payer: Medicare Other | Attending: Emergency Medicine | Admitting: Emergency Medicine

## 2015-01-14 ENCOUNTER — Emergency Department (HOSPITAL_COMMUNITY): Payer: Medicare Other

## 2015-01-14 DIAGNOSIS — Z87440 Personal history of urinary (tract) infections: Secondary | ICD-10-CM | POA: Insufficient documentation

## 2015-01-14 DIAGNOSIS — M1990 Unspecified osteoarthritis, unspecified site: Secondary | ICD-10-CM | POA: Insufficient documentation

## 2015-01-14 DIAGNOSIS — Z79899 Other long term (current) drug therapy: Secondary | ICD-10-CM | POA: Insufficient documentation

## 2015-01-14 DIAGNOSIS — M25552 Pain in left hip: Secondary | ICD-10-CM | POA: Insufficient documentation

## 2015-01-14 DIAGNOSIS — G8929 Other chronic pain: Secondary | ICD-10-CM | POA: Insufficient documentation

## 2015-01-14 DIAGNOSIS — F419 Anxiety disorder, unspecified: Secondary | ICD-10-CM | POA: Diagnosis not present

## 2015-01-14 DIAGNOSIS — Z880 Allergy status to penicillin: Secondary | ICD-10-CM | POA: Insufficient documentation

## 2015-01-14 DIAGNOSIS — M5136 Other intervertebral disc degeneration, lumbar region: Secondary | ICD-10-CM | POA: Insufficient documentation

## 2015-01-14 DIAGNOSIS — M5442 Lumbago with sciatica, left side: Secondary | ICD-10-CM

## 2015-01-14 DIAGNOSIS — I10 Essential (primary) hypertension: Secondary | ICD-10-CM | POA: Insufficient documentation

## 2015-01-14 DIAGNOSIS — M545 Low back pain: Secondary | ICD-10-CM | POA: Diagnosis present

## 2015-01-14 DIAGNOSIS — Z85828 Personal history of other malignant neoplasm of skin: Secondary | ICD-10-CM | POA: Diagnosis not present

## 2015-01-14 LAB — URINALYSIS, ROUTINE W REFLEX MICROSCOPIC
Bilirubin Urine: NEGATIVE
Glucose, UA: NEGATIVE mg/dL
Hgb urine dipstick: NEGATIVE
Ketones, ur: NEGATIVE mg/dL
Nitrite: NEGATIVE
Protein, ur: NEGATIVE mg/dL
Specific Gravity, Urine: 1.019 (ref 1.005–1.030)
Urobilinogen, UA: 0.2 mg/dL (ref 0.0–1.0)
pH: 6 (ref 5.0–8.0)

## 2015-01-14 LAB — URINE MICROSCOPIC-ADD ON

## 2015-01-14 MED ORDER — CEPHALEXIN 500 MG PO CAPS: 500.0000 mg | ORAL_CAPSULE | Freq: Two times a day (BID) | ORAL | Status: DC

## 2015-01-14 MED ORDER — HYDROCODONE-ACETAMINOPHEN 5-325 MG PO TABS: 1.0000 | ORAL_TABLET | Freq: Four times a day (QID) | ORAL | Status: DC | PRN

## 2015-01-14 MED ORDER — CEPHALEXIN 250 MG PO CAPS
500.0000 mg | ORAL_CAPSULE | Freq: Once | ORAL | Status: AC
  Administered 2015-01-14: 500 mg via ORAL
  Filled 2015-01-14: qty 2

## 2015-01-14 NOTE — ED Notes (Signed)
Pt states that she has chronic back pain that extends to her left left with some numbness and tingling in left foot. Pt states that she tried to see her doctor but was not able to get worked in until the end of the month. Pt denies bowel or bladder issues.

## 2015-01-14 NOTE — ED Notes (Signed)
MD at bedside. 

## 2015-01-14 NOTE — ED Provider Notes (Signed)
CSN: [id number]     Arrival date & time 01/14/15  0814 History   First MD Initiated Contact with Patient 01/14/15 [phone]     Chief Complaint  Patient presents with  . Back Pain     (Consider location/radiation/quality/duration/timing/severity/associated sxs/prior Treatment) HPI Pt is a 81yo female with hx of HTN, anxiety, UTI, and arthritis c/o lower back pain that radiates into Left hip and Left leg. Pt is accompanied by her daughter.  Pt states pain has gradually worsened over the last 2 weeks but states yesterday and today have been the worst.  Pain is aching and sore, 8/10, worse with ambulation. She does not use a walker or can at home.  Pt had a lumbar laminectomy/decompression microdiscectomy in 06/04/13 by Dr. Sherley Bounds.  Pt states since then, she has been getting steroid injections which do provide relief for several weeks.  Last injection was 11/11/14.  Pt cannot f/u with Dr. Ronnald Ramp until the end of this month. She denies any recent falls or injuries. She takes Aleve as well as tramadol for pain but states she rarely takes the tramadol because it makes her fall asleep.  Denies fever, n/v/d. Pt does state it is hard to pee at times. Pt states it feels like she has to go but then not much comes out but she will have to pee a lot at night. Pt believes this is due to fluid from her legs. She does have lasix but states she does not take it often as it makes her pee a lot.  Last dose was 1 week ago.  Daughter reports she also does not tender to take her BP medication every day as pt will be feeling well and not take her medications.   Past Medical History  Diagnosis Date  . Hypertension   . Anxiety   . UTI (lower urinary tract infection)   . Cancer     skin  . Arthritis    Past Surgical History  Procedure Laterality Date  . Laparotomy  50    intestinal tortion  . Ankle fracture surgery Right     25 yrs ago  . Foot surgery      bunion 20 yrs ago  . Knee arthroscopy Left 2000  .  Appendectomy    . Breast surgery Left 2011    removed nodule-benign  . Lumbar laminectomy/decompression microdiscectomy N/A 06/04/2013    Procedure: LUMBAR LAMINECTOMY/DECOMPRESSION MICRODISCECTOMY 1 LEVEL;  Surgeon: Eustace Moore, MD;  Location: [facility];  Service: Neurosurgery;  Laterality: N/A;  LUMBAR LAMINECTOMY/DECOMPRESSION MICRODISCECTOMY 1 LEVEL   No family history on file. History  Substance Use Topics  . Smoking status: Never Smoker   . Smokeless tobacco: Not on file  . Alcohol Use: No   OB History    No data available     Review of Systems  Constitutional: Negative for fever, chills, appetite change and fatigue.  Respiratory: Negative for cough and shortness of breath.   Cardiovascular: Positive for leg swelling ( bilaterally (chronic)). Negative for chest pain and palpitations.  Gastrointestinal: Negative for nausea, vomiting, abdominal pain and diarrhea.  Genitourinary: Positive for decreased urine volume. Negative for dysuria, urgency, frequency and hematuria.  Musculoskeletal: Positive for back pain, joint swelling (bilateral feet/ankles (chronic)), arthralgias and gait problem (due to pain). Negative for myalgias, neck pain and neck stiffness.  Skin: Negative for rash and wound.  Neurological: Positive for numbness. Negative for weakness.  All other systems reviewed and are negative.  Allergies  Hydrocortisone; Oxycodone; Penicillins; and Codeine  Home Medications   Prior to Admission medications   Medication Sig Start Date End Date Taking? Authorizing Provider  bisacodyl (DULCOLAX) 5 MG EC tablet Take 5 mg by mouth daily as needed for constipation.   Yes Historical Provider, MD  Cholecalciferol (VITAMIN D PO) Take 723 mg by mouth 3 (three) times a week. Monday, Wednesday, and friday   Yes Historical Provider, MD  furosemide (LASIX) 20 MG tablet Take 20 mg by mouth daily as needed for fluid.   Yes Historical Provider, MD  labetalol (NORMODYNE) 200 MG  tablet Take 200 mg by mouth 2 (two) times daily.   Yes Historical Provider, MD  naproxen sodium (ANAPROX) 220 MG tablet Take 440 mg by mouth 2 (two) times daily with a meal.   Yes Historical Provider, MD  Probiotic Product (ALIGN) 4 MG CAPS Take 4 mg by mouth daily.   Yes Historical Provider, MD  promethazine (PHENERGAN) 25 MG tablet Take 25 mg by mouth every 6 (six) hours as needed for nausea.   Yes Historical Provider, MD  traMADol (ULTRAM) 50 MG tablet Take 50-100 mg by mouth every 6 (six) hours as needed for moderate pain.   Yes Historical Provider, MD  cephALEXin (KEFLEX) 500 MG capsule Take 1 capsule (500 mg total) by mouth 2 (two) times daily. For 10 days 01/14/15   Noland Fordyce, PA-C  HYDROcodone-acetaminophen (NORCO/VICODIN) 5-325 MG per tablet Take 1 tablet by mouth every 6 (six) hours as needed. 01/14/15   Noland Fordyce, PA-C   BP 179/65 mmHg  Pulse 71  Temp(Src) 98.1 F (36.7 C) (Oral)  Resp 16  Ht 5\' 1"  (1.549 m)  Wt 160 lb (72.576 kg)  BMI 30.25 kg/m2  SpO2 96% Physical Exam  Constitutional: She appears well-developed and well-nourished. No distress.  HENT:  Head: Normocephalic and atraumatic.  Eyes: Conjunctivae are normal. No scleral icterus.  Neck: Normal range of motion.  Cardiovascular: Normal rate, regular rhythm and normal heart sounds.   Pulses:      Dorsalis pedis pulses are 2+ on the right side, and 2+ on the left side.       Posterior tibial pulses are 2+ on the right side, and 2+ on the left side.  Pulmonary/Chest: Effort normal and breath sounds normal. No respiratory distress. She has no wheezes. She has no rales. She exhibits no tenderness.  Abdominal: Soft. Bowel sounds are normal. She exhibits no distension and no mass. There is no tenderness. There is no rebound and no guarding.  Genitourinary: Rectum normal. Rectal exam shows no tenderness and anal tone normal.  Chaperoned exam. DRE: normal sphincter tone. Soft Kluesner stool on exam glove.  Musculoskeletal:  Normal range of motion. She exhibits edema.  Well healed surgical scars over lumbar spine. Tenderness over Lumbar spine, Left lumbar muscles, Left buttock, and Left hip w/o crepitus. No spinal step-offs. Bilateral ankle swelling, calves soft, non-tender. Increased pain with Left straight leg raise. 4/5 strength with Left straight leg raise compared to Right due to pain. 5/5 plantar flexion bilaterally.   Neurological: She is alert.  Sensation in tact and symmetric in bilateral legs.  Skin: Skin is warm and dry. No rash noted. She is not diaphoretic. No erythema.  Nursing note and vitals reviewed.   ED Course  Procedures (including critical care time) Labs Review Labs Reviewed  URINALYSIS, ROUTINE W REFLEX MICROSCOPIC (NOT AT [facility]) - Abnormal; Notable for the following:    APPearance CLOUDY (*)  Leukocytes, UA LARGE (*)    All other components within normal limits  URINE MICROSCOPIC-ADD ON - Abnormal; Notable for the following:    Squamous Epithelial / LPF FEW (*)    Bacteria, UA FEW (*)    All other components within normal limits  URINE CULTURE    Imaging Review Dg Lumbar Spine Complete  01/14/2015   CLINICAL DATA:  Left-sided low back pain and left hip pain radiating to the left leg. Duration of symptoms 2 years, but worsening.  EXAM: LUMBAR SPINE - COMPLETE 4+ VIEW  COMPARISON:  CT abdomen 11/03/2013. Previous lumbar radiographs 05/18/2013. MRI lumbar spine 01/01/2013.  FINDINGS: There is thoracolumbar curvature convex to the left with the apex at L2. At L1-2, there is retrolisthesis of 3 mm. There is disc space narrowing. At L2-3, there has been considerable worsening of degenerative disc disease. There is retrolisthesis of 5 mm. There is near complete loss of disc space height with bony endplate changes, more severe on the right. L3-4 shows moderate disc space narrowing. L4-5 shows disc space narrowing on the left. L5-S1 disc height is normal. There is facet arthropathy bilaterally at  L3-4, L4-5 and L5-S1 but no slippage at those levels.  IMPRESSION: Thoracolumbar curvature convex to the left.  Marked progression of degenerative disc disease at L2-3 when compared to the study of 2014, more severe on the right.  Chronic lower lumbar degenerative disc disease and degenerative facet disease which could be symptomatic.   Electronically Signed   By: Nelson Chimes M.D.   On: 01/14/2015 09:07   Dg Hip Unilat With Pelvis 2-3 Views Left  01/14/2015   CLINICAL DATA:  Initial encounter for 2 year history of left lower lumbar pain radiating into the left hip.  EXAM: LEFT HIP (WITH PELVIS) 2-3 VIEWS  COMPARISON:  None.  FINDINGS: Frontal pelvis shows bony demineralization without fracture. SI joints and symphysis pubis are unremarkable. Hypertrophic spurring is seen in the roof of the left acetabulum and along the left femoral head. No worrisome lytic or sclerotic osseous abnormality.  IMPRESSION: Degenerative changes are seen in the left hip.   Electronically Signed   By: Misty Stanley M.D.   On: 01/14/2015 09:07     EKG Interpretation None      MDM   Final diagnoses:  Left hip pain  Left-sided low back pain with left-sided sciatica  DDD (degenerative disc disease), lumbar    Pt is a 81yo female with hx of chronic lower back pain, managed by Dr. Ronnald Ramp, neurosurgery, with cortisone injections. Last injection was 11/11/14.  Pt cannot f/u with Dr. Ronnald Ramp until end of the month. Denies new falls or injuries. Pain gradually worsening.  Pt does report decreased urine during the day but gets up often to urinate at night. Pt thinks this is due to fluid in her legs.  Takes lasix "as needed" last dose 1 week ago. Denies CP or SOB.  Pt does have bilateral leg swelling but states she had normal venous U/S 6 months ago for same swelling.   On exam, pt is tender along lumbar spine, Left lumbar muscles and Left hip. Legs are both neurovascularly in tact. Increased pain with Left straight leg raise.  Plain  films significant for thoracolumbar curvature convex to the left. Marked progression of DDD at L2-L3 compared to studies from 2014. Chronic lower lumbar DDD and degenerative facet disease which could be symptomatic.    UA: evidence of UTI with large leukocytes, WBC-TNTC, but few bacteria  and negative nitrites.  Pt is symptomatic. Will start pt on keflex and send urine culture.  First dose given in ED.  Advised to f/u with PCP next week for recheck of urine and back pain. Also advised to f/u with neurology for continued back pain and worsening DDD. Return precautions provided. Pt and family verbalized understanding and agreement with tx plan.   Noland Fordyce, PA-C 01/14/15 [place], MD 01/14/15 2119

## 2015-01-14 NOTE — ED Notes (Signed)
MD states that pt can take her home tramadol as prescribed. Pt will take 1 tramadol.

## 2015-01-14 NOTE — ED Notes (Signed)
PA at bedside.

## 2015-01-14 NOTE — ED Notes (Signed)
Pt back from X-ray.  

## 2015-01-16 LAB — URINE CULTURE: Colony Count: >=100000

## 2015-01-17 ENCOUNTER — Telehealth (HOSPITAL_BASED_OUTPATIENT_CLINIC_OR_DEPARTMENT_OTHER): Payer: Self-pay | Admitting: Emergency Medicine

## 2015-01-17 NOTE — Telephone Encounter (Signed)
Post ED Visit - Positive Culture Follow-up  Culture report reviewed by antimicrobial stewardship pharmacist: []  Wes [place], Pharm.D., BCPS []  Heide Guile, Pharm.D., BCPS []  Alycia Rossetti, Pharm.D., BCPS []  [place], Pharm.D., BCPS, AAHIVP [x]  Legrand Como, Pharm.D., BCPS, AAHIVP []  Isac Sarna, Pharm.D., BCPS  Positive urine  Culture Klebsiella Treated with cephalexin, organism sensitive to the same and no further patient follow-up is required at this time.  Hazle Nordmann 01/17/2015, 11:17 AM

## 2015-02-07 ENCOUNTER — Other Ambulatory Visit: Payer: Self-pay | Admitting: Neurological Surgery

## 2015-02-11 ENCOUNTER — Encounter (HOSPITAL_COMMUNITY)
Admission: RE | Admit: 2015-02-11 | Discharge: 2015-02-11 | Disposition: A | Discharge status: Home / Self Care | Payer: Medicare Other | Source: Ambulatory Visit | Attending: Neurological Surgery | Admitting: Neurological Surgery

## 2015-02-11 ENCOUNTER — Encounter (HOSPITAL_COMMUNITY): Payer: Self-pay

## 2015-02-11 DIAGNOSIS — Z01812 Encounter for preprocedural laboratory examination: Secondary | ICD-10-CM | POA: Diagnosis not present

## 2015-02-11 DIAGNOSIS — M5126 Other intervertebral disc displacement, lumbar region: Secondary | ICD-10-CM | POA: Insufficient documentation

## 2015-02-11 DIAGNOSIS — IMO0002 Reserved for concepts with insufficient information to code with codable children: Secondary | ICD-10-CM

## 2015-02-11 HISTORY — DX: Other specified postprocedural states: R11.2

## 2015-02-11 HISTORY — DX: Adverse effect of unspecified anesthetic, initial encounter: T41.45XA

## 2015-02-11 HISTORY — DX: Nausea with vomiting, unspecified: Z98.890

## 2015-02-11 HISTORY — DX: Other complications of anesthesia, initial encounter: T88.59XA

## 2015-02-11 HISTORY — DX: Gastro-esophageal reflux disease without esophagitis: K21.9

## 2015-02-11 LAB — CBC WITH DIFFERENTIAL/PLATELET
BASOS PCT: 0 % (ref 0–1)
Basophils Absolute: 0 10*3/uL (ref 0.0–0.1)
Eosinophils Absolute: 0.3 10*3/uL (ref 0.0–0.7)
Eosinophils Relative: 3 % (ref 0–5)
HCT: 40.4 % (ref 36.0–46.0)
HEMOGLOBIN: 13.1 g/dL (ref 12.0–15.0)
Lymphocytes Relative: 11 % — ABNORMAL LOW (ref 12–46)
Lymphs Abs: 1 10*3/uL (ref 0.7–4.0)
MCH: 30.8 pg (ref 26.0–34.0)
MCHC: 32.4 g/dL (ref 30.0–36.0)
MCV: 95.1 fL (ref 78.0–100.0)
MONO ABS: 0.9 10*3/uL (ref 0.1–1.0)
MONOS PCT: 10 % (ref 3–12)
NEUTROS PCT: 76 % (ref 43–77)
Neutro Abs: 6.7 10*3/uL (ref 1.7–7.7)
Platelets: 148 10*3/uL — ABNORMAL LOW (ref 150–400)
RBC: 4.25 MIL/uL (ref 3.87–5.11)
RDW: 13.3 % (ref 11.5–15.5)
WBC: 9 10*3/uL (ref 4.0–10.5)

## 2015-02-11 LAB — COMPREHENSIVE METABOLIC PANEL
ALBUMIN: 3 g/dL — AB (ref 3.5–5.0)
ALT: 30 U/L (ref 14–54)
ANION GAP: 8 (ref 5–15)
AST: 34 U/L (ref 15–41)
Alkaline Phosphatase: 44 U/L (ref 38–126)
BILIRUBIN TOTAL: 0.9 mg/dL (ref 0.3–1.2)
BUN: 29 mg/dL — ABNORMAL HIGH (ref 6–20)
CHLORIDE: 100 mmol/L — AB (ref 101–111)
CO2: 30 mmol/L (ref 22–32)
Calcium: 9.3 mg/dL (ref 8.9–10.3)
Creatinine, Ser: 0.87 mg/dL (ref 0.44–1.00)
Glucose, Bld: 122 mg/dL — ABNORMAL HIGH (ref 65–99)
POTASSIUM: 4.8 mmol/L (ref 3.5–5.1)
SODIUM: 138 mmol/L (ref 135–145)
Total Protein: 5.9 g/dL — ABNORMAL LOW (ref 6.5–8.1)

## 2015-02-11 LAB — SURGICAL PCR SCREEN
MRSA, PCR: NEGATIVE
STAPHYLOCOCCUS AUREUS: NEGATIVE

## 2015-02-11 NOTE — Progress Notes (Signed)
req'd ekg, cxr from  hosp from 2 weeks ago

## 2015-02-11 NOTE — Pre-Procedure Instructions (Addendum)
Colleen Watkins  02/11/2015      [facility] - [place], [place] - [address] [place] [address] Phone: [phone] Fax: [place], [place] - [place]. [place] [address] Phone: [phone] Fax: [phone]    Your procedure is scheduled on 02/17/15  Report to [facility] cone short stay admitting at 200 P.M.  Call this number if you have problems the morning of surgery:  [phone]   Remember:  Do not eat food or drink liquids after midnight.  Take these medicines the morning of surgery with A SIP OF WATER labetalol. Pain med if needed    STOP all herbel meds, nsaids (aleve,naproxen,advil,ibuprofen) 5 days prior to surgery starting 02/12/15 including probiotic   Do not wear jewelry, make-up or nail polish.  Do not wear lotions, powders, or perfumes.  You may wear deodorant.  Do not shave 48 hours prior to surgery.  Men may shave face and neck.  Do not bring valuables to the hospital.  [facility] is not responsible for any belongings or valuables.  Contacts, dentures or bridgework may not be worn into surgery.  Leave your suitcase in the car.  After surgery it may be brought to your room.  For patients admitted to the hospital, discharge time will be determined by your treatment team.  Patients discharged the day of surgery will not be allowed to drive home.   Name and phone number of your driver:    Special instructions:   Special Instructions: [place] - Preparing for Surgery  Before surgery, you can play an important role.  Because skin is not sterile, your skin needs to be as free of germs as possible.  You can reduce the number of germs on you skin by washing with CHG (chlorahexidine gluconate) soap before surgery.  CHG is an antiseptic cleaner which kills germs and bonds with the skin to continue killing germs even after washing.  Please DO NOT use if you have an allergy to CHG or  antibacterial soaps.  If your skin becomes reddened/irritated stop using the CHG and inform your nurse when you arrive at Short Stay.  Do not shave (including legs and underarms) for at least 48 hours prior to the first CHG shower.  You may shave your face.  Please follow these instructions carefully:   1.  Shower with CHG Soap the night before surgery and the morning of Surgery.  2.  If you choose to wash your hair, wash your hair first as usual with your normal shampoo.  3.  After you shampoo, rinse your hair and body thoroughly to remove the Shampoo.  4.  Use CHG as you would any other liquid soap.  You can apply chg directly  to the skin and wash gently with scrungie or a clean washcloth.  5.  Apply the CHG Soap to your body ONLY FROM THE NECK DOWN.  Do not use on open wounds or open sores.  Avoid contact with your eyes ears, mouth and genitals (private parts).  Wash genitals (private parts)       with your normal soap.  6.  Wash thoroughly, paying special attention to the area where your surgery will be performed.  7.  Thoroughly rinse your body with warm water from the neck down.  8.  DO NOT shower/wash with your normal soap after using and rinsing off the CHG Soap.  9.  Pat yourself dry with a clean towel.            10.  Wear clean pajamas.            11.  Place clean sheets on your bed the night of your first shower and do not sleep with pets.  Day of Surgery  Do not apply any lotions/deodorants the morning of surgery.  Please wear clean clothes to the hospital/surgery center.  Please read over the following fact sheets that you were given. Pain Booklet, Coughing and Deep Breathing, MRSA Information and Surgical Site Infection Prevention

## 2015-02-16 MED ORDER — VANCOMYCIN HCL 10 G IV SOLR
1500.0000 mg | INTRAVENOUS | Status: AC
  Administered 2015-02-17: 1500 mg via INTRAVENOUS
  Filled 2015-02-16: qty 1500

## 2015-02-16 NOTE — Anesthesia Preprocedure Evaluation (Addendum)
Anesthesia Evaluation  Patient identified by MRN, date of birth, ID band Patient awake    Reviewed: Allergy & Precautions, NPO status , Patient's Chart, lab work & pertinent test results, reviewed documented beta blocker date and time   History of Anesthesia Complications (+) PONV  Airway Mallampati: II   Neck ROM: Full    Dental  (+) Teeth Intact, Dental Advisory Given   Pulmonary neg pulmonary ROS,  breath sounds clear to auscultation        Cardiovascular hypertension, Pt. on medications Rhythm:Regular     Neuro/Psych Anxiety    GI/Hepatic GERD-  ,  Endo/Other    Renal/GU      Musculoskeletal   Abdominal (+)  Abdomen: soft.    Peds  Hematology 13/40   Anesthesia Other Findings   Reproductive/Obstetrics                            Anesthesia Physical Anesthesia Plan  ASA: III  Anesthesia Plan: General   Post-op Pain Management:    Induction: Intravenous  Airway Management Planned: Oral ETT  Additional Equipment:   Intra-op Plan:   Post-operative Plan: Extubation in OR  Informed Consent: I have reviewed the patients History and Physical, chart, labs and discussed the procedure including the risks, benefits and alternatives for the proposed anesthesia with the patient or authorized representative who has indicated his/her understanding and acceptance.     Plan Discussed with:   Anesthesia Plan Comments: (Check am EKG)        Anesthesia Quick Evaluation

## 2015-02-16 NOTE — Progress Notes (Signed)
Pt called with time change, verbalized understanding to arrive at 1300 for 1505 surgery.

## 2015-02-17 ENCOUNTER — Ambulatory Visit (HOSPITAL_COMMUNITY)
Admission: RE | Admit: 2015-02-17 | Discharge: 2015-02-18 | Disposition: A | Discharge status: Skilled Nursing Facility | Payer: Medicare Other | Source: Ambulatory Visit | Attending: Neurological Surgery | Admitting: Neurological Surgery

## 2015-02-17 ENCOUNTER — Ambulatory Visit (HOSPITAL_COMMUNITY): Payer: Medicare Other | Admitting: Anesthesiology

## 2015-02-17 ENCOUNTER — Encounter (HOSPITAL_COMMUNITY)
Admission: RE | Disposition: A | Discharge status: Skilled Nursing Facility | Payer: Self-pay | Source: Ambulatory Visit | Attending: Neurological Surgery

## 2015-02-17 ENCOUNTER — Encounter (HOSPITAL_COMMUNITY): Payer: Self-pay | Admitting: Neurological Surgery

## 2015-02-17 ENCOUNTER — Ambulatory Visit (HOSPITAL_COMMUNITY): Payer: Medicare Other

## 2015-02-17 DIAGNOSIS — Z85820 Personal history of malignant melanoma of skin: Secondary | ICD-10-CM | POA: Insufficient documentation

## 2015-02-17 DIAGNOSIS — M5136 Other intervertebral disc degeneration, lumbar region: Secondary | ICD-10-CM | POA: Diagnosis not present

## 2015-02-17 DIAGNOSIS — Z419 Encounter for procedure for purposes other than remedying health state, unspecified: Secondary | ICD-10-CM

## 2015-02-17 DIAGNOSIS — Z9889 Other specified postprocedural states: Secondary | ICD-10-CM

## 2015-02-17 DIAGNOSIS — Z880 Allergy status to penicillin: Secondary | ICD-10-CM | POA: Diagnosis not present

## 2015-02-17 DIAGNOSIS — F419 Anxiety disorder, unspecified: Secondary | ICD-10-CM | POA: Insufficient documentation

## 2015-02-17 DIAGNOSIS — Z885 Allergy status to narcotic agent status: Secondary | ICD-10-CM | POA: Insufficient documentation

## 2015-02-17 DIAGNOSIS — Z888 Allergy status to other drugs, medicaments and biological substances status: Secondary | ICD-10-CM | POA: Insufficient documentation

## 2015-02-17 DIAGNOSIS — Z882 Allergy status to sulfonamides status: Secondary | ICD-10-CM | POA: Insufficient documentation

## 2015-02-17 DIAGNOSIS — M5126 Other intervertebral disc displacement, lumbar region: Secondary | ICD-10-CM | POA: Diagnosis present

## 2015-02-17 DIAGNOSIS — I10 Essential (primary) hypertension: Secondary | ICD-10-CM | POA: Insufficient documentation

## 2015-02-17 DIAGNOSIS — K219 Gastro-esophageal reflux disease without esophagitis: Secondary | ICD-10-CM | POA: Insufficient documentation

## 2015-02-17 DIAGNOSIS — IMO0002 Reserved for concepts with insufficient information to code with codable children: Secondary | ICD-10-CM

## 2015-02-17 HISTORY — PX: LUMBAR LAMINECTOMY/DECOMPRESSION MICRODISCECTOMY: SHX5026

## 2015-02-17 HISTORY — DX: Other specified postprocedural states: Z98.890

## 2015-02-17 SURGERY — LUMBAR LAMINECTOMY/DECOMPRESSION MICRODISCECTOMY 1 LEVEL
Anesthesia: General | Site: Back | Laterality: Left

## 2015-02-17 MED ORDER — MENTHOL 3 MG MT LOZG
1.0000 | LOZENGE | OROMUCOSAL | Status: DC | PRN
  Filled 2015-02-17: qty 9

## 2015-02-17 MED ORDER — SCOPOLAMINE 1 MG/3DAYS TD PT72
MEDICATED_PATCH | TRANSDERMAL | Status: AC
  Administered 2015-02-17: 1.5 mg
  Filled 2015-02-17: qty 1

## 2015-02-17 MED ORDER — THROMBIN 5000 UNITS EX SOLR
CUTANEOUS | Status: DC | PRN
  Administered 2015-02-17 (×2): 5000 [IU] via TOPICAL

## 2015-02-17 MED ORDER — PHENYLEPHRINE 40 MCG/ML (10ML) SYRINGE FOR IV PUSH (FOR BLOOD PRESSURE SUPPORT)
PREFILLED_SYRINGE | INTRAVENOUS | Status: AC
  Filled 2015-02-17: qty 10

## 2015-02-17 MED ORDER — PROMETHAZINE HCL 25 MG/ML IJ SOLN: 6.2500 mg | INTRAMUSCULAR | Status: DC | PRN

## 2015-02-17 MED ORDER — POTASSIUM CHLORIDE IN NACL 20-0.9 MEQ/L-% IV SOLN: INTRAVENOUS | Status: DC

## 2015-02-17 MED ORDER — ONDANSETRON HCL 4 MG/2ML IJ SOLN
INTRAMUSCULAR | Status: DC | PRN
  Administered 2015-02-17: 4 mg via INTRAVENOUS

## 2015-02-17 MED ORDER — ARTIFICIAL TEARS OP OINT
TOPICAL_OINTMENT | OPHTHALMIC | Status: DC | PRN
  Administered 2015-02-17: 1 via OPHTHALMIC

## 2015-02-17 MED ORDER — PROPOFOL 10 MG/ML IV BOLUS
INTRAVENOUS | Status: DC | PRN
  Administered 2015-02-17: 150 mg via INTRAVENOUS

## 2015-02-17 MED ORDER — EPHEDRINE SULFATE 50 MG/ML IJ SOLN
INTRAMUSCULAR | Status: AC
  Filled 2015-02-17: qty 1

## 2015-02-17 MED ORDER — LIDOCAINE HCL (CARDIAC) 20 MG/ML IV SOLN
INTRAVENOUS | Status: AC
  Filled 2015-02-17: qty 5

## 2015-02-17 MED ORDER — ROCURONIUM BROMIDE 100 MG/10ML IV SOLN
INTRAVENOUS | Status: DC | PRN
  Administered 2015-02-17: 40 mg via INTRAVENOUS

## 2015-02-17 MED ORDER — SODIUM CHLORIDE 0.9 % IJ SOLN
3.0000 mL | Freq: Two times a day (BID) | INTRAMUSCULAR | Status: DC
Start: 2015-02-17 — End: 2015-02-18

## 2015-02-17 MED ORDER — LACTATED RINGERS IV SOLN
INTRAVENOUS | Status: DC
  Administered 2015-02-17: 13:00:00 via INTRAVENOUS

## 2015-02-17 MED ORDER — FENTANYL CITRATE (PF) 100 MCG/2ML IJ SOLN
INTRAMUSCULAR | Status: AC
  Filled 2015-02-17: qty 2

## 2015-02-17 MED ORDER — NEOSTIGMINE METHYLSULFATE 10 MG/10ML IV SOLN
INTRAVENOUS | Status: DC | PRN
  Administered 2015-02-17: 5 mg via INTRAVENOUS

## 2015-02-17 MED ORDER — ONDANSETRON HCL 4 MG/2ML IJ SOLN: 4.0000 mg | INTRAMUSCULAR | Status: DC | PRN

## 2015-02-17 MED ORDER — FENTANYL CITRATE (PF) 100 MCG/2ML IJ SOLN
25.0000 ug | INTRAMUSCULAR | Status: DC | PRN
  Administered 2015-02-17: 25 ug via INTRAVENOUS
  Administered 2015-02-17: 50 ug via INTRAVENOUS
  Administered 2015-02-17 (×3): 25 ug via INTRAVENOUS

## 2015-02-17 MED ORDER — METOCLOPRAMIDE HCL 5 MG/ML IJ SOLN
INTRAMUSCULAR | Status: AC
  Filled 2015-02-17: qty 2

## 2015-02-17 MED ORDER — ROCURONIUM BROMIDE 50 MG/5ML IV SOLN
INTRAVENOUS | Status: AC
  Filled 2015-02-17: qty 2

## 2015-02-17 MED ORDER — MORPHINE SULFATE 2 MG/ML IJ SOLN
1.0000 mg | INTRAMUSCULAR | Status: DC | PRN
  Administered 2015-02-17: 2 mg via INTRAVENOUS
  Filled 2015-02-17: qty 1

## 2015-02-17 MED ORDER — ONDANSETRON HCL 4 MG/2ML IJ SOLN
INTRAMUSCULAR | Status: AC
  Filled 2015-02-17: qty 8

## 2015-02-17 MED ORDER — BUPIVACAINE HCL (PF) 0.25 % IJ SOLN
INTRAMUSCULAR | Status: DC | PRN
  Administered 2015-02-17: 6 mL

## 2015-02-17 MED ORDER — FENTANYL CITRATE (PF) 100 MCG/2ML IJ SOLN
INTRAMUSCULAR | Status: AC
  Administered 2015-02-17: 25 ug via INTRAVENOUS
  Filled 2015-02-17: qty 2

## 2015-02-17 MED ORDER — METOCLOPRAMIDE HCL 5 MG/ML IJ SOLN
INTRAMUSCULAR | Status: DC | PRN
  Administered 2015-02-17: 10 mg via INTRAVENOUS

## 2015-02-17 MED ORDER — KETOROLAC TROMETHAMINE 30 MG/ML IJ SOLN
INTRAMUSCULAR | Status: AC
  Filled 2015-02-17: qty 2

## 2015-02-17 MED ORDER — GLYCOPYRROLATE 0.2 MG/ML IJ SOLN
INTRAMUSCULAR | Status: AC
  Filled 2015-02-17: qty 2

## 2015-02-17 MED ORDER — PHENOL 1.4 % MT LIQD: 1.0000 | OROMUCOSAL | Status: DC | PRN

## 2015-02-17 MED ORDER — ACETAMINOPHEN 650 MG RE SUPP
650.0000 mg | RECTAL | Status: DC | PRN
Start: 2015-02-17 — End: 2015-02-18

## 2015-02-17 MED ORDER — FENTANYL CITRATE (PF) 100 MCG/2ML IJ SOLN
INTRAMUSCULAR | Status: DC | PRN
  Administered 2015-02-17: 100 ug via INTRAVENOUS

## 2015-02-17 MED ORDER — HYDROCODONE-ACETAMINOPHEN 5-325 MG PO TABS
1.0000 | ORAL_TABLET | Freq: Four times a day (QID) | ORAL | Status: DC | PRN
  Administered 2015-02-17: 1 via ORAL
  Administered 2015-02-18: 2 via ORAL
  Administered 2015-02-18: 1 via ORAL
  Filled 2015-02-17: qty 1
  Filled 2015-02-17: qty 2

## 2015-02-17 MED ORDER — GLYCOPYRROLATE 0.2 MG/ML IJ SOLN
INTRAMUSCULAR | Status: AC
  Filled 2015-02-17: qty 6

## 2015-02-17 MED ORDER — TRAMADOL HCL 50 MG PO TABS
50.0000 mg | ORAL_TABLET | Freq: Four times a day (QID) | ORAL | Status: DC | PRN
  Administered 2015-02-17 – 2015-02-18 (×2): 50 mg via ORAL
  Filled 2015-02-17: qty 2
  Filled 2015-02-17: qty 1

## 2015-02-17 MED ORDER — ACETAMINOPHEN 325 MG PO TABS: 650.0000 mg | ORAL_TABLET | ORAL | Status: DC | PRN

## 2015-02-17 MED ORDER — SODIUM CHLORIDE 0.9 % IV SOLN
INTRAVENOUS | Status: DC | PRN
  Administered 2015-02-17: 15:00:00 via INTRAVENOUS

## 2015-02-17 MED ORDER — SODIUM CHLORIDE 0.9 % IJ SOLN: 3.0000 mL | INTRAMUSCULAR | Status: DC | PRN

## 2015-02-17 MED ORDER — ROCURONIUM BROMIDE 50 MG/5ML IV SOLN
INTRAVENOUS | Status: AC
  Filled 2015-02-17: qty 1

## 2015-02-17 MED ORDER — SODIUM CHLORIDE 0.9 % IV SOLN: 250.0000 mL | INTRAVENOUS | Status: DC

## 2015-02-17 MED ORDER — THROMBIN 5000 UNITS EX SOLR
OROMUCOSAL | Status: DC | PRN
  Administered 2015-02-17: 15:00:00 via TOPICAL

## 2015-02-17 MED ORDER — NEOSTIGMINE METHYLSULFATE 10 MG/10ML IV SOLN
INTRAVENOUS | Status: AC
  Filled 2015-02-17: qty 1

## 2015-02-17 MED ORDER — GLYCOPYRROLATE 0.2 MG/ML IJ SOLN
INTRAMUSCULAR | Status: DC | PRN
  Administered 2015-02-17: 0.6 mg via INTRAVENOUS

## 2015-02-17 MED ORDER — SODIUM CHLORIDE 0.9 % IJ SOLN
INTRAMUSCULAR | Status: AC
  Filled 2015-02-17: qty 10

## 2015-02-17 MED ORDER — MEPERIDINE HCL 25 MG/ML IJ SOLN: 6.2500 mg | INTRAMUSCULAR | Status: DC | PRN

## 2015-02-17 MED ORDER — 0.9 % SODIUM CHLORIDE (POUR BTL) OPTIME
TOPICAL | Status: DC | PRN
  Administered 2015-02-17: 1000 mL

## 2015-02-17 MED ORDER — VANCOMYCIN HCL IN DEXTROSE 1-5 GM/200ML-% IV SOLN
1000.0000 mg | Freq: Once | INTRAVENOUS | Status: AC
  Administered 2015-02-18: 1000 mg via INTRAVENOUS
  Filled 2015-02-17: qty 200

## 2015-02-17 MED ORDER — PHENYLEPHRINE HCL 10 MG/ML IJ SOLN
INTRAMUSCULAR | Status: DC | PRN
  Administered 2015-02-17: 40 ug via INTRAVENOUS

## 2015-02-17 MED ORDER — LABETALOL HCL 200 MG PO TABS
200.0000 mg | ORAL_TABLET | Freq: Two times a day (BID) | ORAL | Status: DC
  Administered 2015-02-17 – 2015-02-18 (×2): 200 mg via ORAL
  Filled 2015-02-17 (×2): qty 1

## 2015-02-17 MED ORDER — FENTANYL CITRATE (PF) 250 MCG/5ML IJ SOLN
INTRAMUSCULAR | Status: AC
  Filled 2015-02-17: qty 5

## 2015-02-17 MED ORDER — HEMOSTATIC AGENTS (NO CHARGE) OPTIME
TOPICAL | Status: DC | PRN
  Administered 2015-02-17: 1 via TOPICAL

## 2015-02-17 MED ORDER — VECURONIUM BROMIDE 10 MG IV SOLR
INTRAVENOUS | Status: AC
  Filled 2015-02-17: qty 20

## 2015-02-17 MED ORDER — LACTATED RINGERS IV SOLN
INTRAVENOUS | Status: DC | PRN
  Administered 2015-02-17: 14:00:00 via INTRAVENOUS

## 2015-02-17 MED ORDER — DOCUSATE SODIUM 100 MG PO CAPS
100.0000 mg | ORAL_CAPSULE | Freq: Two times a day (BID) | ORAL | Status: DC
  Administered 2015-02-17 – 2015-02-18 (×2): 100 mg via ORAL
  Filled 2015-02-17 (×2): qty 1

## 2015-02-17 MED ORDER — HYDROCODONE-ACETAMINOPHEN 5-325 MG PO TABS
ORAL_TABLET | ORAL | Status: AC
  Filled 2015-02-17: qty 1

## 2015-02-17 MED ORDER — LIDOCAINE HCL (CARDIAC) 20 MG/ML IV SOLN
INTRAVENOUS | Status: DC | PRN
  Administered 2015-02-17: 100 mg via INTRAVENOUS

## 2015-02-17 MED ORDER — ARTIFICIAL TEARS OP OINT
TOPICAL_OINTMENT | OPHTHALMIC | Status: AC
  Filled 2015-02-17: qty 7

## 2015-02-17 MED ORDER — FUROSEMIDE 20 MG PO TABS
20.0000 mg | ORAL_TABLET | Freq: Every day | ORAL | Status: DC
  Filled 2015-02-17: qty 1

## 2015-02-17 MED ORDER — SODIUM CHLORIDE 0.9 % IR SOLN
Status: DC | PRN
  Administered 2015-02-17: 15:00:00

## 2015-02-17 SURGICAL SUPPLY — 47 items
APL SKNCLS STERI-STRIP NONHPOA (GAUZE/BANDAGES/DRESSINGS) ×1
BAG DECANTER FOR FLEXI CONT (MISCELLANEOUS) ×3 IMPLANT
BENZOIN TINCTURE PRP APPL 2/3 (GAUZE/BANDAGES/DRESSINGS) ×3 IMPLANT
BUR MATCHSTICK NEURO 3.0 LAGG (BURR) ×3 IMPLANT
CANISTER SUCT 3000ML PPV (MISCELLANEOUS) ×3 IMPLANT
CLOSURE WOUND 1/2 X4 (GAUZE/BANDAGES/DRESSINGS) ×1
CONT SPEC 4OZ CLIKSEAL STRL BL (MISCELLANEOUS) ×3 IMPLANT
DRAPE LAPAROTOMY 100X72X124 (DRAPES) ×3 IMPLANT
DRAPE MICROSCOPE LEICA (MISCELLANEOUS) ×3 IMPLANT
DRAPE POUCH INSTRU U-SHP 10X18 (DRAPES) ×3 IMPLANT
DRAPE SURG 17X23 STRL (DRAPES) ×3 IMPLANT
DRSG OPSITE 4X5.5 SM (GAUZE/BANDAGES/DRESSINGS) ×3 IMPLANT
DRSG OPSITE POSTOP 3X4 (GAUZE/BANDAGES/DRESSINGS) ×2 IMPLANT
DRSG TELFA 3X8 NADH (GAUZE/BANDAGES/DRESSINGS) ×3 IMPLANT
DURAPREP 26ML APPLICATOR (WOUND CARE) ×3 IMPLANT
ELECT REM PT RETURN 9FT ADLT (ELECTROSURGICAL) ×3
ELECTRODE REM PT RTRN 9FT ADLT (ELECTROSURGICAL) ×1 IMPLANT
GAUZE SPONGE 4X4 16PLY XRAY LF (GAUZE/BANDAGES/DRESSINGS) IMPLANT
GLOVE BIO SURGEON STRL SZ8 (GLOVE) ×3 IMPLANT
GLOVE ECLIPSE 7.5 STRL STRAW (GLOVE) ×4 IMPLANT
GOWN STRL REUS W/ TWL LRG LVL3 (GOWN DISPOSABLE) IMPLANT
GOWN STRL REUS W/ TWL XL LVL3 (GOWN DISPOSABLE) ×1 IMPLANT
GOWN STRL REUS W/TWL 2XL LVL3 (GOWN DISPOSABLE) IMPLANT
GOWN STRL REUS W/TWL LRG LVL3 (GOWN DISPOSABLE) ×3
GOWN STRL REUS W/TWL XL LVL3 (GOWN DISPOSABLE) ×6
HEMOSTAT POWDER KIT SURGIFOAM (HEMOSTASIS) IMPLANT
KIT BASIN OR (CUSTOM PROCEDURE TRAY) ×3 IMPLANT
KIT ROOM TURNOVER OR (KITS) ×3 IMPLANT
NDL HYPO 25X1 1.5 SAFETY (NEEDLE) ×1 IMPLANT
NDL SPNL 20GX3.5 QUINCKE YW (NEEDLE) IMPLANT
NEEDLE HYPO 25X1 1.5 SAFETY (NEEDLE) ×3 IMPLANT
NEEDLE SPNL 20GX3.5 QUINCKE YW (NEEDLE) IMPLANT
NS IRRIG 1000ML POUR BTL (IV SOLUTION) ×3 IMPLANT
PACK LAMINECTOMY NEURO (CUSTOM PROCEDURE TRAY) ×3 IMPLANT
PAD ARMBOARD 7.5X6 YLW CONV (MISCELLANEOUS) ×9 IMPLANT
PAD DRESSING TELFA 3X8 NADH (GAUZE/BANDAGES/DRESSINGS) ×1 IMPLANT
RUBBERBAND STERILE (MISCELLANEOUS) ×6 IMPLANT
SPONGE SURGIFOAM ABS GEL SZ50 (HEMOSTASIS) ×3 IMPLANT
STRIP CLOSURE SKIN 1/2X4 (GAUZE/BANDAGES/DRESSINGS) ×2 IMPLANT
SUT VIC AB 0 CT1 18XCR BRD8 (SUTURE) ×1 IMPLANT
SUT VIC AB 0 CT1 8-18 (SUTURE) ×3
SUT VIC AB 2-0 CP2 18 (SUTURE) ×3 IMPLANT
SUT VIC AB 3-0 SH 8-18 (SUTURE) ×3 IMPLANT
SYR 20ML ECCENTRIC (SYRINGE) ×3 IMPLANT
TOWEL OR 17X24 6PK STRL BLUE (TOWEL DISPOSABLE) ×3 IMPLANT
TOWEL OR 17X26 10 PK STRL BLUE (TOWEL DISPOSABLE) ×3 IMPLANT
WATER STERILE IRR 1000ML POUR (IV SOLUTION) ×3 IMPLANT

## 2015-02-17 NOTE — Progress Notes (Signed)
Pt arrived at 4N18. Alert and oriented. C/O of pain 5/10. Small drainage noted on lower back dressing. Safety measures in place. Bed in lowest position. Call light within reach.   Docia Barrier, RN

## 2015-02-17 NOTE — H&P (Signed)
Subjective:  Patient is a 81 y.o. female admitted for L leg pain. Onset of symptoms was a few weeks ago, rapidly worsening since that time.  The pain is rated intense, and is located at the across the lower back and radiates to L leg. The pain is described as aching and occurs all day. The symptoms have been progressive. Symptoms are exacerbated by exercise. MRI or CT showed HNP L3-4 left with inferior free fragment.   Past Medical History  Diagnosis Date  . Hypertension   . Anxiety   . UTI (lower urinary tract infection)     recent   rx  . Cancer     skin  . Arthritis   . Complication of anesthesia   . PONV (postoperative nausea and vomiting)   . UTI (lower urinary tract infection)   . GERD (gastroesophageal reflux disease)     occ    Past Surgical History  Procedure Laterality Date  . Laparotomy  50    intestinal tortion  . Ankle fracture surgery Right     25 yrs ago  . Foot surgery Right     bunion 20 yrs ago  . Knee arthroscopy Left 2000  . Appendectomy    . Breast surgery Left 2011    removed nodule-benign  . Lumbar laminectomy/decompression microdiscectomy N/A 06/04/2013    Procedure: LUMBAR LAMINECTOMY/DECOMPRESSION MICRODISCECTOMY 1 LEVEL;  Surgeon: Eustace Moore, MD;  Location: [facility];  Service: Neurosurgery;  Laterality: N/A;  LUMBAR LAMINECTOMY/DECOMPRESSION MICRODISCECTOMY 1 LEVEL    Prior to Admission medications   Medication Sig Start Date End Date Taking? Authorizing Provider  bisacodyl (DULCOLAX) 5 MG EC tablet Take 5 mg by mouth daily as needed for constipation.   Yes Historical Provider, MD  cholecalciferol (VITAMIN D) 1000 UNITS tablet Take 4,000 Units by mouth daily.   Yes Historical Provider, MD  furosemide (LASIX) 20 MG tablet Take by mouth daily as needed for fluid.    Yes Historical Provider, MD  HYDROcodone-acetaminophen (NORCO/VICODIN) 5-325 MG per tablet Take 1 tablet by mouth every 6 (six) hours as needed. 01/14/15  Yes Noland Fordyce, PA-C   labetalol (NORMODYNE) 200 MG tablet Take 200 mg by mouth 2 (two) times daily.   Yes Historical Provider, MD  naproxen sodium (ANAPROX) 220 MG tablet Take 440 mg by mouth 2 (two) times daily with a meal.   Yes Historical Provider, MD  Polyethyl Glycol-Propyl Glycol (SYSTANE OP) Apply to eye 1 day or 1 dose.   Yes Historical Provider, MD  Probiotic Product (ALIGN) 4 MG CAPS Take 4 mg by mouth daily.   Yes Historical Provider, MD  promethazine (PHENERGAN) 25 MG tablet Take 25 mg by mouth every 6 (six) hours as needed for nausea.   Yes Historical Provider, MD  traMADol (ULTRAM) 50 MG tablet Take 50-100 mg by mouth every 6 (six) hours as needed for moderate pain.   Yes Historical Provider, MD   Allergies  Allergen Reactions  . Oxycodone   . Penicillins     Lip/ face tingling  . Codeine Rash  . Hydrocortisone Nausea Only  . Sulfa Antibiotics Rash    History  Substance Use Topics  . Smoking status: Never Smoker   . Smokeless tobacco: Not on file  . Alcohol Use: No    History reviewed. No pertinent family history.   Review of Systems  Positive ROS: Negative  All other systems have been reviewed and were otherwise negative with the exception of those mentioned in the  HPI and as above.  Objective: Vital signs in last 24 hours: Temp:  [98.6 F (37 C)] 98.6 F (37 C) (07/07 1309) Pulse Rate:  [85] 85 (07/07 1309) Resp:  [20] 20 (07/07 1309) BP: (182)/(72) 182/72 mmHg (07/07 1309) SpO2:  [97 %] 97 % (07/07 1309) Weight:  [162 lb (73.483 kg)] 162 lb (73.483 kg) (07/07 1309)  General Appearance: Alert, cooperative, no distress, appears stated age Head: Normocephalic, without obvious abnormality, atraumatic Eyes: PERRL, conjunctiva/corneas clear, EOM's intact    Neck: Supple, symmetrical, trachea midline Back: Symmetric, no curvature, ROM normal, no CVA tenderness Lungs:  respirations unlabored Heart: Regular rate and rhythm Abdomen: Soft, non-tender Extremities: Extremities  normal, atraumatic, no cyanosis or edema Pulses: 2+ and symmetric all extremities Skin: Skin color, texture, turgor normal, no rashes or lesions  NEUROLOGIC:   Mental status: Alert and oriented x4,  no aphasia, good attention span, fund of knowledge, and memory Motor Exam - grossly normal Sensory Exam - grossly normal Reflexes: 1+ Coordination - grossly normal Gait - grossly normal Balance - grossly normal Cranial Nerves: I: smell Not tested  II: visual acuity  OS: nl    OD: nl  II: visual fields Full to confrontation  II: pupils Equal, round, reactive to light  III,VII: ptosis None  III,IV,VI: extraocular muscles  Full ROM  V: mastication Normal  V: facial light touch sensation  Normal  V,VII: corneal reflex  Present  VII: facial muscle function - upper  Normal  VII: facial muscle function - lower Normal  VIII: hearing Not tested  IX: soft palate elevation  Normal  IX,X: gag reflex Present  XI: trapezius strength  5/5  XI: sternocleidomastoid strength 5/5  XI: neck flexion strength  5/5  XII: tongue strength  Normal    Data Review Lab Results  Component Value Date   WBC 9.0 02/11/2015   HGB 13.1 02/11/2015   HCT 40.4 02/11/2015   MCV 95.1 02/11/2015   PLT 148* 02/11/2015   Lab Results  Component Value Date   NA 138 02/11/2015   K 4.8 02/11/2015   CL 100* 02/11/2015   CO2 30 02/11/2015   BUN 29* 02/11/2015   CREATININE 0.87 02/11/2015   GLUCOSE 122* 02/11/2015   No results found for: INR, PROTIME  Assessment/Plan: Patient admitted for L L3-4 microdiskectomy. Patient has failed a reasonable attempt at conservative therapy.  I explained the condition and procedure to the patient and answered any questions.  Patient wishes to proceed with procedure as planned. Understands risks/ benefits and typical outcomes of procedure.   Colleen Watkins S 02/17/2015 1:26 PM

## 2015-02-17 NOTE — Op Note (Signed)
02/17/2015  3:52 PM  PATIENT:  Colleen Watkins  81 y.o. female  PRE-OPERATIVE DIAGNOSIS:   Left L3-4  Herniated  Nucleus pulposus with inferior free fragment with left leg pain  POST-OPERATIVE DIAGNOSIS:   same  PROCEDURE:   Left L3-4 hemilaminectomy medial facetectomy and foraminotomy followed by discectomy L3-4 on the left  SURGEON:  Sherley Bounds, MD  ASSISTANTS:  Dr. Dillard Essex  ANESTHESIA:   General  EBL:  Less than 25 ml  Total I/O In: 1000 [I.V.:1000] Out: -   BLOOD ADMINISTERED:none  DRAINS:  none   SPECIMEN:  No Specimen  INDICATION FOR PROCEDURE:  This patient presented with severe left leg pain. She had an MRI which showed a herniated disc L3-4 left with a large inferior free fragment. She tried medical management without relief. Her pain was debilitating. I recommended a left L3-4 microdiscectomy. Patient understood the risks, benefits, and alternatives and potential outcomes and wished to proceed.  PROCEDURE DETAILS: The patient was taken to the operating room and after induction of adequate generalized endotracheal anesthesia, the patient was rolled into the prone position on the Wilson frame and all pressure points were padded. The lumbar region was cleaned and then prepped with DuraPrep and draped in the usual sterile fashion. 5 cc of local anesthesia was injected and then a dorsal midline incision was made and carried down to the lumbo sacral fascia. The fascia was opened and the paraspinous musculature was taken down in a subperiosteal fashion to expose  L3-4 on the  left. Intraoperative x-ray confirmed my level, and then I used a combination of the high-speed drill and the Kerrison punches to perform a hemilaminectomy, medial facetectomy, and foraminotomy at  L3-4 on the  left. The underlying yellow ligament was opened and removed in a piecemeal fashion to expose the underlying dura and exiting nerve root. I undercut the lateral recess and dissected down until I was medial to  and distal to the pedicle. The nerve root was well decompressed.  I found a large fragment in the axilla of the L4  Nerve root on the left. This was removed with a micropituitary rongeur. Several more fragments were then removed.We then gently retracted the nerve root medially with a retractor, coagulated the epidural venous vasculature, and  Found a large annular tear at L3-4 on the left with a fragment meeting through the tear.  This was removed and then Iperformed a thorough intradiscal discectomy with pituitary rongeurs, until I had a nice decompression of the nerve root and the midline. I then palpated with a coronary dilator along the nerve root and into the foramen to assure adequate decompression. I felt no more compression of the nerve root. I irrigated with saline solution containing bacitracin. Achieved hemostasis with bipolar cautery, lined the dura with Gelfoam, and then closed the fascia with 0 Vicryl. I closed the subcutaneous tissues with 2-0 Vicryl and the subcuticular tissues with 3-0 Vicryl. The skin was then closed with benzoin and Steri-Strips. The drapes were removed, a sterile dressing was applied. The patient was awakened from general anesthesia and transferred to the recovery room in stable condition. At the end of the procedure all sponge, needle and instrument counts were correct.   PLAN OF CARE: Admit for overnight observation  PATIENT DISPOSITION:  PACU - hemodynamically stable.   Delay start of Pharmacological VTE agent (>24hrs) due to surgical blood loss or risk of bleeding:  yes

## 2015-02-17 NOTE — Anesthesia Postprocedure Evaluation (Signed)
  Anesthesia Post-op Note  Patient: Colleen Watkins  Procedure(s) Performed: Procedure(s): LUMBAR LAMINECTOMY/DECOMPRESSION MICRODISCECTOMY LUMBAR THREE FOUR (Left)  Patient Location: PACU  Anesthesia Type:General  Level of Consciousness: awake  Airway and Oxygen Therapy: Patient Spontanous Breathing  Post-op Pain: mild  Post-op Assessment: Post-op Vital signs reviewed              Post-op Vital Signs: Reviewed and stable  Last Vitals:  Filed Vitals:   02/17/15 1309  BP: 182/72  Pulse: 85  Temp: 37 C  Resp: 20    Complications: No apparent anesthesia complications

## 2015-02-17 NOTE — Anesthesia Procedure Notes (Signed)
Procedure Name: Intubation Date/Time: 02/17/2015 2:48 PM Performed by: Jacquiline Doe A Pre-anesthesia Checklist: Patient identified, Timeout performed, Emergency Drugs available, Suction available and Patient being monitored Patient Re-evaluated:Patient Re-evaluated prior to inductionOxygen Delivery Method: Circle system utilized Preoxygenation: Pre-oxygenation with 100% oxygen Intubation Type: IV induction and Cricoid Pressure applied Ventilation: Mask ventilation without difficulty Laryngoscope Size: Mac and 3 Grade View: Grade I Tube type: Oral Tube size: 7.5 mm Number of attempts: 1 Airway Equipment and Method: Stylet Placement Confirmation: ETT inserted through vocal cords under direct vision,  breath sounds checked- equal and bilateral and positive ETCO2 Secured at: 22 cm Tube secured with: Tape Dental Injury: Teeth and Oropharynx as per pre-operative assessment

## 2015-02-17 NOTE — Transfer of Care (Signed)
Immediate Anesthesia Transfer of Care Note  Patient: Colleen Watkins  Procedure(s) Performed: Procedure(s): LUMBAR LAMINECTOMY/DECOMPRESSION MICRODISCECTOMY LUMBAR THREE FOUR (Left)  Patient Location: PACU  Anesthesia Type:General  Level of Consciousness: awake, oriented, sedated, patient cooperative and responds to stimulation  Airway & Oxygen Therapy: Patient Spontanous Breathing and Patient connected to nasal cannula oxygen  Post-op Assessment: Report given to RN, Post -op Vital signs reviewed and stable, Patient moving all extremities and Patient moving all extremities X 4  Post vital signs: Reviewed and stable  Last Vitals:  Filed Vitals:   02/17/15 1309  BP: 182/72  Pulse: 85  Temp: 37 C  Resp: 20    Complications: No apparent anesthesia complications

## 2015-02-18 ENCOUNTER — Encounter (HOSPITAL_COMMUNITY): Payer: Self-pay | Admitting: Neurological Surgery

## 2015-02-18 DIAGNOSIS — M5126 Other intervertebral disc displacement, lumbar region: Secondary | ICD-10-CM | POA: Diagnosis not present

## 2015-02-18 MED ORDER — HYDROCODONE-ACETAMINOPHEN 5-325 MG PO TABS: 1.0000 | ORAL_TABLET | ORAL | Status: DC | PRN

## 2015-02-18 NOTE — Evaluation (Addendum)
Physical Therapy Evaluation Patient Details Name: Colleen Watkins MRN: [id number] DOB: April 14, 1933 Today's Date: 02/18/2015   History of Present Illness  81 y.o. female with history of lumbar laminectomy 05/2013 admitted 02/17/15 for L3-L4 hemilaminectomy/discectomy.    Clinical Impression  Pt admitted with above diagnosis. Pt currently with functional limitations due to the deficits listed below (see PT Problem List). Mod assist for supine to sit. Pt walked 14' x 2 with RW and min A, distance limited by pain. SNF recommended since pt lives alone and is requiring assist for mobility.  Pt will benefit from skilled PT to increase their independence and safety with mobility to allow discharge to the venue listed below.       Follow Up Recommendations SNF;Supervision/Assistance - 24 hour    Equipment Recommendations  Rolling walker with 5" wheels    Recommendations for Other Services OT consult     Precautions / Restrictions Precautions Precautions: Fall;Back Precaution Comments: reviewed back precautions with pt and daughter, issued handout Restrictions Weight Bearing Restrictions: No      Mobility  Bed Mobility Overal bed mobility: Needs Assistance Bed Mobility: Rolling Rolling: Mod assist         General bed mobility comments: verbal cues for log roll technique, mod A to initiate roll and to raise trunk  Transfers Overall transfer level: Needs assistance Equipment used: Rolling walker (2 wheeled) Transfers: Sit to/from Stand Sit to Stand: Min assist         General transfer comment: verbal cues for hand placement, min A to rise, verbal cues for backing all the way up to [facility] prior to sitting, uncontrolled descent to bed after walking to bathroom  Ambulation/Gait Ambulation/Gait assistance: Min assist Ambulation Distance (Feet): 28 Feet (14' x 2  bed to bathroom to bed) Assistive device: Rolling walker (2 wheeled) Gait Pattern/deviations: Decreased step length -  right;Decreased step length - left;Trunk flexed;Antalgic   Gait velocity interpretation: Below normal speed for age/gender General Gait Details: distance limited by pain, min A for balance  Stairs            Wheelchair Mobility    Modified Rankin (Stroke Patients Only)       Balance Overall balance assessment: Needs assistance   Sitting balance-Leahy Scale: Good     Standing balance support: Bilateral upper extremity supported Standing balance-Leahy Scale: Poor Standing balance comment: requires BUE support                             Pertinent Vitals/Pain Pain Assessment: 0-10 Pain Score: 5  Pain Location: back and LLE down to foot Pain Descriptors / Indicators: Sore Pain Intervention(s): Limited activity within patient's tolerance;Monitored during session;Premedicated before session;Repositioned    Home Living Family/patient expects to be discharged to:: Skilled nursing facility Living Arrangements: Alone               Additional Comments: pt plans to DC to SNF    Prior Function Level of Independence: Independent         Comments: walked short distances without AD, limited by back and LLE pain PTA     Hand Dominance        Extremity/Trunk Assessment   Upper Extremity Assessment: Overall WFL for tasks assessed           Lower Extremity Assessment: LLE deficits/detail   LLE Deficits / Details: knee ext 3/5, ankle at least 3/5  Cervical / Trunk Assessment: Normal  Communication  Communication: No difficulties  Cognition Arousal/Alertness: Awake/alert Behavior During Therapy: WFL for tasks assessed/performed Overall Cognitive Status: Within Functional Limits for tasks assessed                      General Comments      Exercises        Assessment/Plan    PT Assessment Patient needs continued PT services  PT Diagnosis Difficulty walking;Acute pain   PT Problem List Decreased activity tolerance;Decreased  balance;Pain;Decreased knowledge of use of DME;Decreased mobility  PT Treatment Interventions DME instruction;Gait training;Functional mobility training;Therapeutic activities;Patient/family education;Therapeutic exercise   PT Goals (Current goals can be found in the Care Plan section) Acute Rehab PT Goals Patient Stated Goal: to decrease pain PT Goal Formulation: With patient Time For Goal Achievement: 02/25/15 Potential to Achieve Goals: Fair    Frequency Min 6X/week   Barriers to discharge        Co-evaluation               End of Session   Activity Tolerance: Patient limited by pain Patient left: in bed;with call bell/phone within reach;with family/visitor present Nurse Communication: Mobility status    Functional Assessment Tool Used: clinical judgemetn Functional Limitation: Mobility: Walking and moving around Mobility: Walking and Moving Around Current Status [phone]): At least 40 percent but less than 60 percent impaired, limited or restricted Mobility: Walking and Moving Around Goal Status [phone]): At least 1 percent but less than 20 percent impaired, limited or restricted    Time: 0842-0905 PT Time Calculation (min) (ACUTE ONLY): 23 min   Charges:   PT Evaluation $Initial PT Evaluation Tier I: 1 Procedure PT Treatments $Gait Training: 8-22 mins   PT G Codes:   PT G-Codes **NOT FOR INPATIENT CLASS** Functional Assessment Tool Used: clinical judgemetn Functional Limitation: Mobility: Walking and moving around Mobility: Walking and Moving Around Current Status ([id number]): At least 40 percent but less than 60 percent impaired, limited or restricted Mobility: Walking and Moving Around Goal Status [phone]): At least 1 percent but less than 20 percent impaired, limited or restricted    Colleen Watkins 02/18/2015, 9:16 AM [phone]

## 2015-02-18 NOTE — Progress Notes (Signed)
TRANSPORTED FROM FROM PER STRETCHER BY EMS IN NO ACUTE DISTRESS. ALERT ORIENTED PAIN CONTROLLED, DAUGHTER AT SIDE. REPORT CALLED TO [facility]

## 2015-02-18 NOTE — Clinical Social Work Note (Signed)
Clinical Social Work Assessment  Patient Details  Name: Colleen Watkins MRN: [id number] Date of Birth: Apr 30, 1933  Date of referral:  02/18/15               Reason for consult:  Facility Placement                Permission sought to share information with:  Family Supports Permission granted to share information::  Yes, Verbal Permission Granted  Name::     Carmac,Karen  Relationship::  daughter   Housing/Transportation Living arrangements for the past 2 months:  Single Family Home Source of Information:  Patient, Adult Children Patient Interpreter Needed:  None Criminal Activity/Legal Involvement Pertinent to Current Situation/Hospitalization:  No - Comment as needed Significant Relationships:  Adult Children Lives with:  Self Do you feel safe going back to the place where you live?  No Need for family participation in patient care:  Yes (Comment)  Care giving concerns:  N/A   Social Worker assessment / plan:  CSW met the pt and the pt's daughter Santiago Glad at bedside. CSW introduced self and purpose of the visit. CSW discussed SNF rehab. CSW explained the SNF process. CSW explained insurance and its relation to SNF placement. Santiago Glad reported that the have prearranged SNF placement at [facility]. Santiago Glad and CSW discussed discharge today. CSW answered all questions in which the pt/Karen inquired about. CSW will continue to follow this pt and assist with discharge as needed.   Employment status:  Retired Nurse, adult PT Recommendations:  [place] / Referral to community resources:  [place]  Patient/Family's Response to care: Pt reported that they staff cared for her will.   Patient/Family's Understanding of and Emotional Response to Diagnosis, Current Treatment, and Prognosis:  Pt repeorted that she has been in back pain prior to surgery. Pt reported that she thought that she would have immediate relieve from  her pain.    Emotional Assessment Appearance:  Appears stated age Attitude/Demeanor/Rapport:  Apprehensive Affect (typically observed):  Sad Orientation:  Oriented to Self, Oriented to Place, Oriented to  Time, Oriented to Situation Alcohol / Substance use:  Not Applicable Psych involvement (Current and /or in the community):  No (Comment)  Discharge Needs  Concerns to be addressed:  Denies Needs/Concerns at this time Readmission within the last 30 days:  No Current discharge risk:  None Barriers to Discharge:  No Barriers Identified   Prithvi Kooi, LCSW 02/18/2015, 11:52 AM

## 2015-02-18 NOTE — Discharge Summary (Signed)
Physician Discharge Summary  Patient ID: Colleen Watkins MRN: [id number] DOB/AGE: 79-Dec-1934 79 y.o.  Admit date: 02/17/2015 Discharge date: 02/18/2015  Admission Diagnoses: L L3-4 HNP    Discharge Diagnoses: same   Discharged Condition: stable  Hospital Course: The patient was admitted on 02/17/2015 and taken to the operating room where the patient underwent L L3-4 micrdiskectomy. The patient tolerated the procedure well and was taken to the recovery room and then to the floor in stable condition. The hospital course was routine. There were no complications. The wound remained clean dry and intact. Pt had appropriate back soreness. No complaints of leg pain or new N/T/W. The patient remained afebrile with stable vital signs, and tolerated a regular diet. The patient continued to increase activities, and pain was well controlled with oral pain medications.   Consults: None  Significant Diagnostic Studies:  Results for orders placed or performed during the hospital encounter of 02/11/15  Surgical pcr screen  Result Value Ref Range   MRSA, PCR NEGATIVE NEGATIVE   Staphylococcus aureus NEGATIVE NEGATIVE  CBC WITH DIFFERENTIAL  Result Value Ref Range   WBC 9.0 4.0 - 10.5 K/uL   RBC 4.25 3.87 - 5.11 MIL/uL   Hemoglobin 13.1 12.0 - 15.0 g/dL   HCT 40.4 36.0 - 46.0 %   MCV 95.1 78.0 - 100.0 fL   MCH 30.8 26.0 - 34.0 pg   MCHC 32.4 30.0 - 36.0 g/dL   RDW 13.3 11.5 - 15.5 %   Platelets 148 (L) 150 - 400 K/uL   Neutrophils Relative % 76 43 - 77 %   Neutro Abs 6.7 1.7 - 7.7 K/uL   Lymphocytes Relative 11 (L) 12 - 46 %   Lymphs Abs 1.0 0.7 - 4.0 K/uL   Monocytes Relative 10 3 - 12 %   Monocytes Absolute 0.9 0.1 - 1.0 K/uL   Eosinophils Relative 3 0 - 5 %   Eosinophils Absolute 0.3 0.0 - 0.7 K/uL   Basophils Relative 0 0 - 1 %   Basophils Absolute 0.0 0.0 - 0.1 K/uL  Comprehensive metabolic panel  Result Value Ref Range   Sodium 138 135 - 145 mmol/L   Potassium 4.8 3.5 - 5.1 mmol/L    Chloride 100 (L) 101 - 111 mmol/L   CO2 30 22 - 32 mmol/L   Glucose, Bld 122 (H) 65 - 99 mg/dL   BUN 29 (H) 6 - 20 mg/dL   Creatinine, Ser 0.87 0.44 - 1.00 mg/dL   Calcium 9.3 8.9 - 10.3 mg/dL   Total Protein 5.9 (L) 6.5 - 8.1 g/dL   Albumin 3.0 (L) 3.5 - 5.0 g/dL   AST 34 15 - 41 U/L   ALT 30 14 - 54 U/L   Alkaline Phosphatase 44 38 - 126 U/L   Total Bilirubin 0.9 0.3 - 1.2 mg/dL   GFR calc non Af Amer >60 >60 mL/min   GFR calc Af Amer >60 >60 mL/min   Anion gap 8 5 - 15    Dg Lumbar Spine 1 View  02/17/2015   CLINICAL DATA:  L3-4 micro diskectomy  EXAM: LUMBAR SPINE - 1 VIEW  COMPARISON:  None.  FINDINGS: Cross-table lateral lumbar spine image labeled #1. Metallic probe tip is posterior to the midportion of the L4 vertebral body. There is marked disc space narrowing at L2-3 with moderately severe narrowing at L3-4. No fracture or spondylolisthesis.  IMPRESSION: Metallic probe tip is posterior to the midportion of the L4 vertebral body. There is osteoarthritic  changes several levels, most marked at L2-3.   Electronically Signed   By: Lowella Grip III M.D.   On: 02/17/2015 15:49    Antibiotics:  Anti-infectives    Start     Dose/Rate Route Frequency Ordered Stop   02/18/15 0200  vancomycin (VANCOCIN) IVPB 1000 mg/200 mL premix     1,000 mg 200 mL/hr over 60 Minutes Intravenous  Once 02/17/15 1828 02/18/15 0242   02/17/15 1500  bacitracin 50,000 Units in sodium chloride irrigation 0.9 % 500 mL irrigation  Status:  Discontinued       As needed 02/17/15 1516 02/17/15 1603   02/17/15 0600  vancomycin (VANCOCIN) 1,500 mg in sodium chloride 0.9 % 500 mL IVPB     1,500 mg 250 mL/hr over 120 Minutes Intravenous On call to O.R. 02/16/15 1357 02/17/15 1530      Discharge Exam: Blood pressure 152/69, pulse 96, temperature 97.5 F (36.4 C), temperature source Oral, resp. rate 16, height 5\' 1"  (1.549 m), weight 162 lb (73.483 kg), SpO2 93 %. Neurologic: Grossly normal Dressing  dry  Discharge Medications:     Medication List    TAKE these medications        ALIGN 4 MG Caps  Take 4 mg by mouth daily.     bisacodyl 5 MG EC tablet  Commonly known as:  DULCOLAX  Take 5 mg by mouth daily as needed for constipation.     cholecalciferol 1000 UNITS tablet  Commonly known as:  VITAMIN D  Take 4,000 Units by mouth daily.     furosemide 20 MG tablet  Commonly known as:  LASIX  Take by mouth daily as needed for fluid.     HYDROcodone-acetaminophen 5-325 MG per tablet  Commonly known as:  NORCO/VICODIN  Take 1 tablet by mouth every 4 (four) hours as needed for moderate pain.     labetalol 200 MG tablet  Commonly known as:  NORMODYNE  Take 200 mg by mouth 2 (two) times daily.     naproxen sodium 220 MG tablet  Commonly known as:  ANAPROX  Take 440 mg by mouth 2 (two) times daily with a meal.     promethazine 25 MG tablet  Commonly known as:  PHENERGAN  Take 25 mg by mouth every 6 (six) hours as needed for nausea.     SYSTANE OP  Apply to eye 1 day or 1 dose.     traMADol 50 MG tablet  Commonly known as:  ULTRAM  Take 50-100 mg by mouth every 6 (six) hours as needed for moderate pain.        Disposition: SNF   Final Dx: L L3-4 diskectomy      Discharge Instructions     Remove dressing in 72 hours    Complete by:  As directed      Call MD for:  difficulty breathing, headache or visual disturbances    Complete by:  As directed      Call MD for:  persistant nausea and vomiting    Complete by:  As directed      Call MD for:  redness, tenderness, or signs of infection (pain, swelling, redness, odor or green/yellow discharge around incision site)    Complete by:  As directed      Call MD for:  severe uncontrolled pain    Complete by:  As directed      Call MD for:  temperature >100.4    Complete by:  As directed  Diet - low sodium heart healthy    Complete by:  As directed      Discharge instructions    Complete by:  As directed   No  driving, no heavy lifting, no strenuous activity     Increase activity slowly    Complete by:  As directed            Follow-up Information    Follow up with Mavryk Pino S, MD. Schedule an appointment as soon as possible for a visit in 3 weeks.   Specialty:  Neurosurgery   Contact information:   [address] [place] [address] [phone]        Signed: Eustace Moore 02/18/2015, 10:42 AM

## 2015-02-18 NOTE — Clinical Social Work Placement (Signed)
   CLINICAL SOCIAL WORK PLACEMENT  NOTE  Date:  02/18/2015  Patient Details  Name: Colleen Watkins MRN: [id number] Date of Birth: 14-Aug-1932  Clinical Social Work is seeking post-discharge placement for this patient at the [place] level of care (*CSW will initial, date and re-position this form in  chart as items are completed):  Yes   Patient/family provided with [facility] Work Department's list of facilities offering this level of care within the geographic area requested by the patient (or if unable, by the patient's family).  Yes   Patient/family informed of their freedom to choose among providers that offer the needed level of care, that participate in Medicare, Medicaid or managed care program needed by the patient, have an available bed and are willing to accept the patient.  Yes   Patient/family informed of [facility]'s ownership interest in [facility] and [facility], as well as of the fact that they are under no obligation to receive care at these facilities.  PASRR submitted to EDS on 02/18/15     PASRR number received on 02/18/15     Existing PASRR number confirmed on       FL2 transmitted to all facilities in geographic area requested by pt/family on 02/18/15     FL2 transmitted to all facilities within larger geographic area on       Patient informed that his/her managed care company has contracts with or will negotiate with certain facilities, including the following:        Yes   Patient/family informed of bed offers received.  Patient chooses bed at [facility]     Physician recommends and patient chooses bed at      Patient to be transferred to [facility] on 02/18/15.  Patient to be transferred to facility by [facility]      Patient family notified on 02/18/15 of transfer.  Name of family member notified:  Lanier Ensign, daughter      PHYSICIAN       Additional Comment:     _______________________________________________ Greta Doom, LCSW 02/18/2015, 11:57 AM

## 2015-05-04 DIAGNOSIS — I2510 Atherosclerotic heart disease of native coronary artery without angina pectoris: Secondary | ICD-10-CM

## 2015-05-04 DIAGNOSIS — I313 Pericardial effusion (noninflammatory): Secondary | ICD-10-CM | POA: Insufficient documentation

## 2015-05-04 DIAGNOSIS — I3139 Other pericardial effusion (noninflammatory): Secondary | ICD-10-CM

## 2015-05-04 DIAGNOSIS — Z8719 Personal history of other diseases of the digestive system: Secondary | ICD-10-CM

## 2015-05-04 HISTORY — DX: Pericardial effusion (noninflammatory): I31.3

## 2015-05-04 HISTORY — DX: Personal history of other diseases of the digestive system: Z87.19

## 2015-05-04 HISTORY — DX: Atherosclerotic heart disease of native coronary artery without angina pectoris: I25.10

## 2015-05-04 HISTORY — DX: Other pericardial effusion (noninflammatory): I31.39

## 2015-09-06 IMAGING — DX DG LUMBAR SPINE COMPLETE 4+V
5 series · 5 of 5 positions shown · non-contrast
Comparison: CT abdomen 11/03/2013. Previous lumbar radiographs
05/18/2013. MRI lumbar spine 01/01/2013.

CLINICAL DATA: Left-sided low back pain and left hip pain radiating
to the left leg. Duration of symptoms 2 years, but worsening.

EXAM:
LUMBAR SPINE - COMPLETE 4+ VIEW

[l-spine ap]
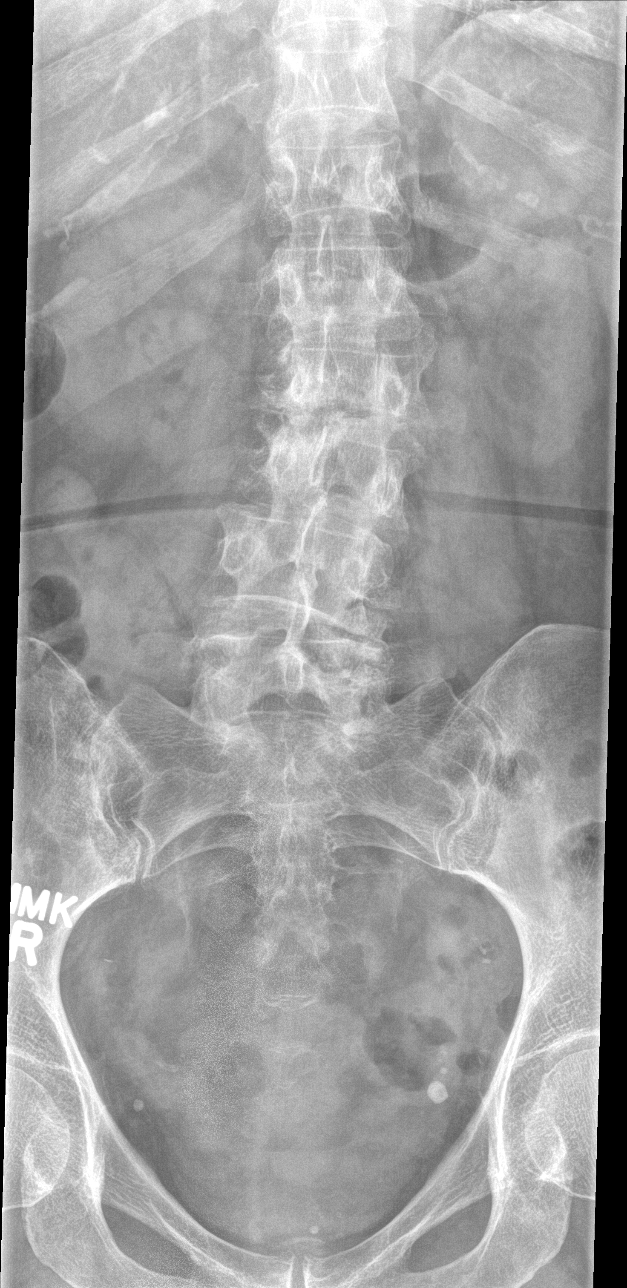

[l-spine obl (1 of 2)]
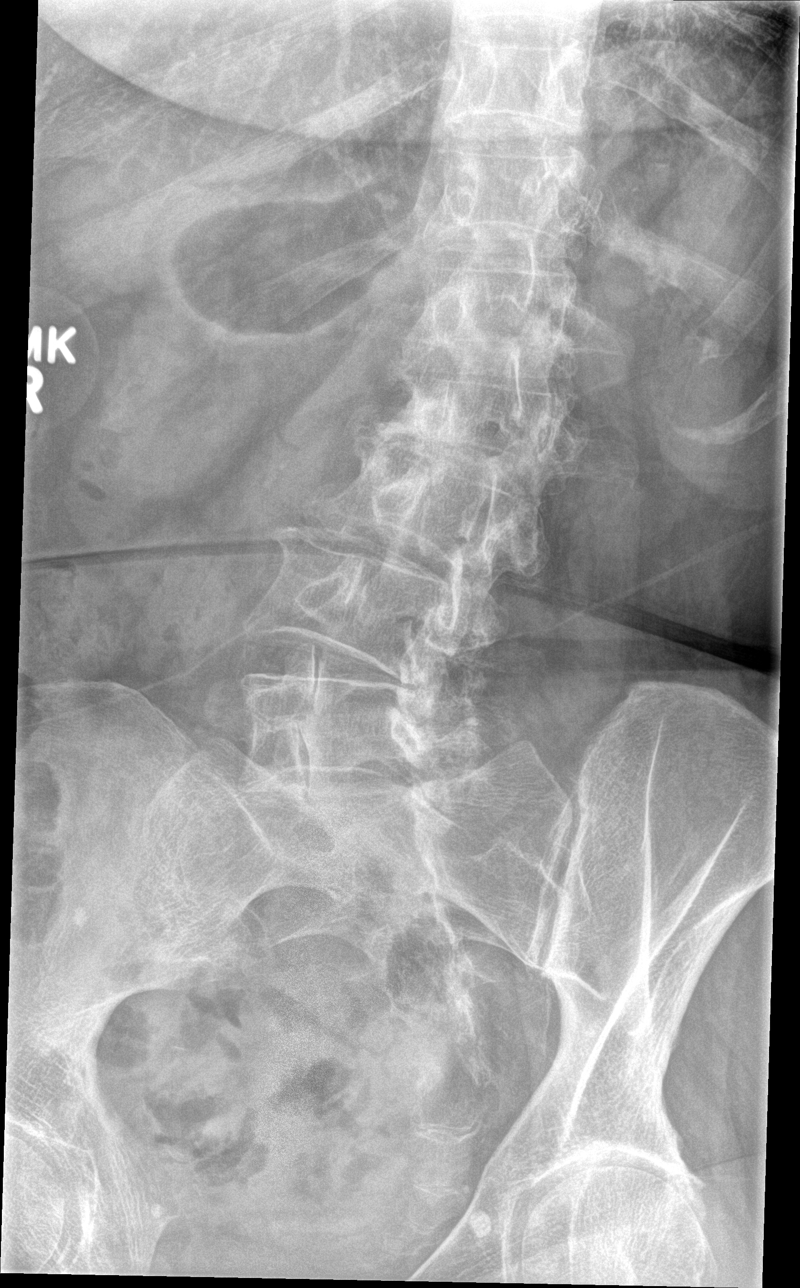

[l-spine obl (2 of 2)]
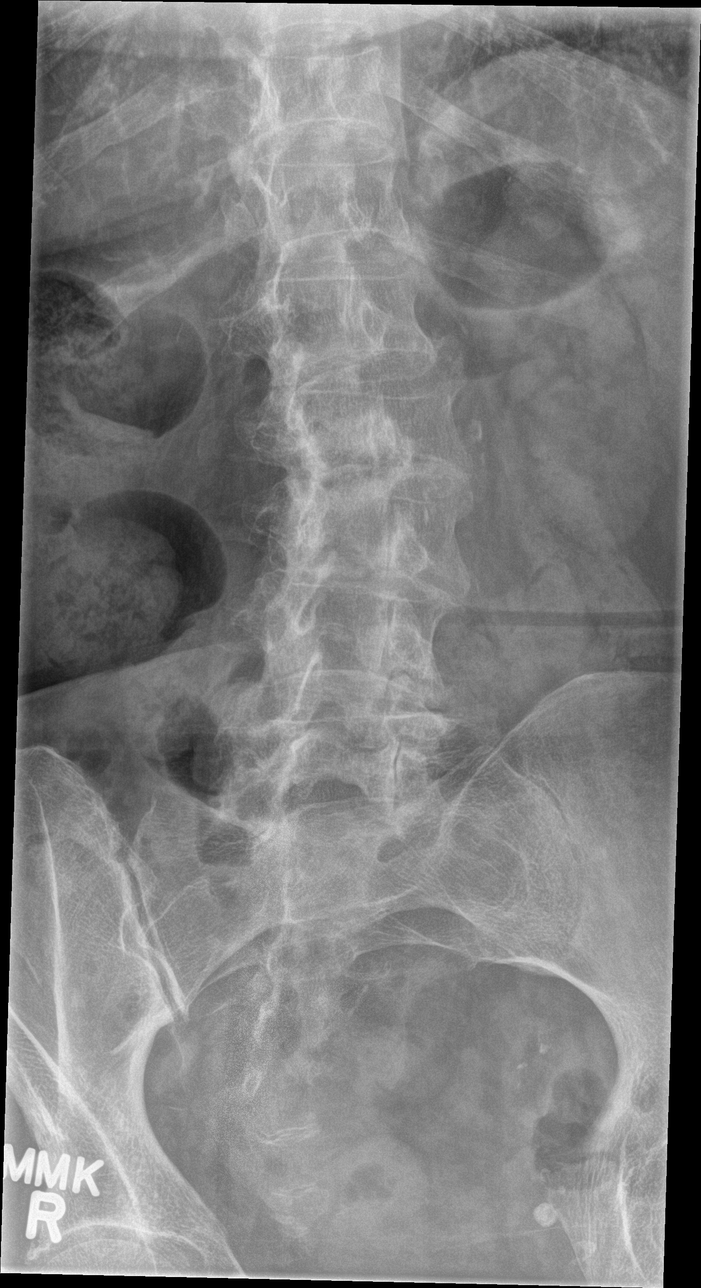

[l-spine lat]
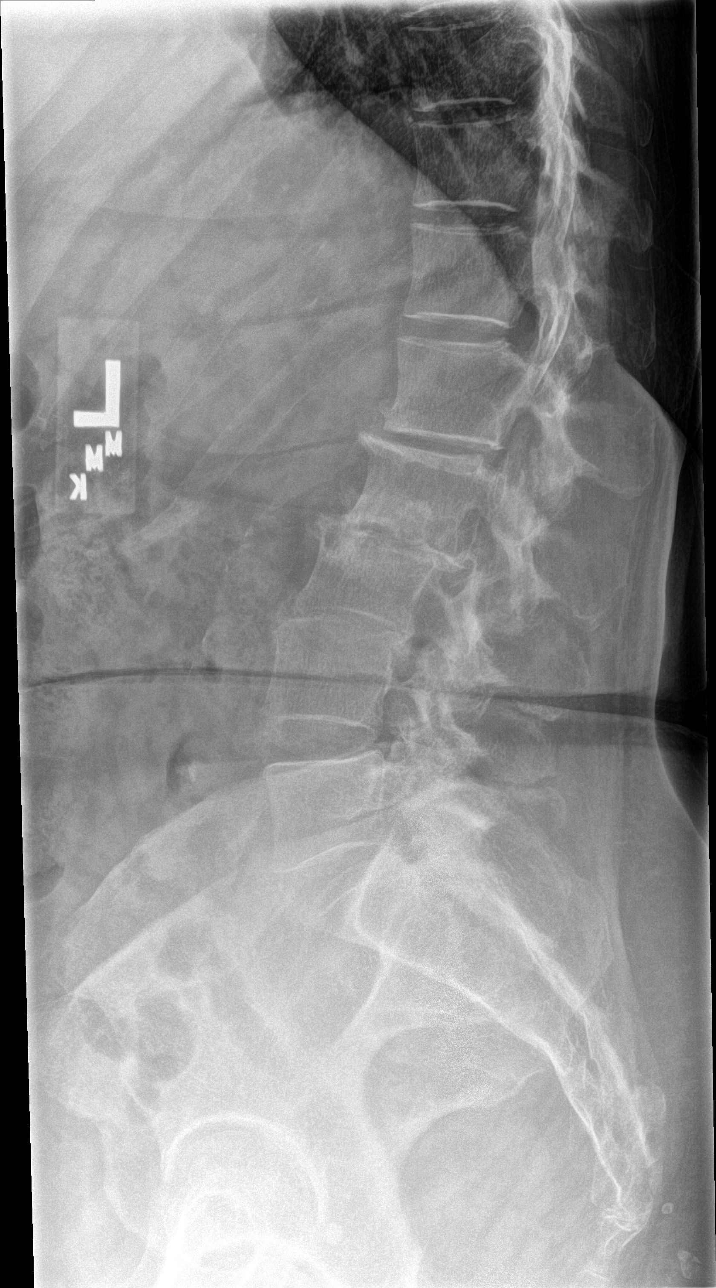

[l-spine spot]
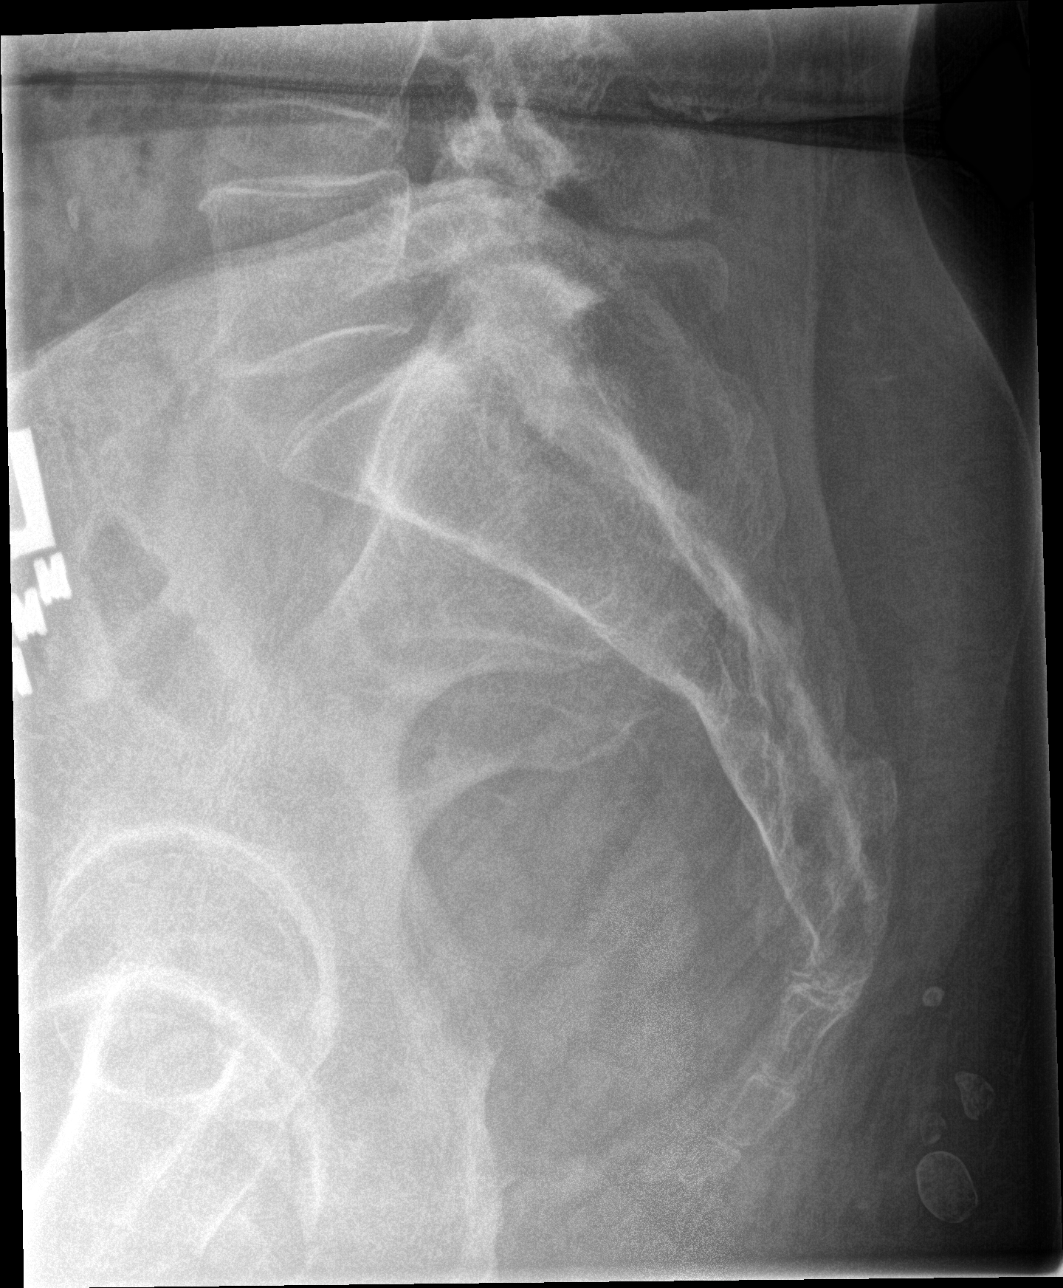

[5 of 5 positions shown; findings below may reference images not displayed]

FINDINGS: There is thoracolumbar curvature convex to the left with the apex at
L2. At L1-2, there is retrolisthesis of 3 mm. There is disc space
narrowing. At L2-3, there has been considerable worsening of
degenerative disc disease. There is retrolisthesis of 5 mm. There is
near complete loss of disc space height with bony endplate changes,
more severe on the right. L3-4 shows moderate disc space narrowing.
L4-5 shows disc space narrowing on the left. L5-S1 disc height is
normal. There is facet arthropathy bilaterally at L3-4, L4-5 and
L5-S1 but no slippage at those levels.
IMPRESSION: Thoracolumbar curvature convex to the left.

Marked progression of degenerative disc disease at L2-3 when
compared to the study of 3273, more severe on the right.

Chronic lower lumbar degenerative disc disease and degenerative
facet disease which could be symptomatic.

## 2015-10-10 IMAGING — CR DG LUMBAR SPINE 1V
1 series · 1 of 1 positions shown · non-contrast
Comparison: None.

CLINICAL DATA: L3-4 micro diskectomy

EXAM:
LUMBAR SPINE - 1 VIEW

[lat]
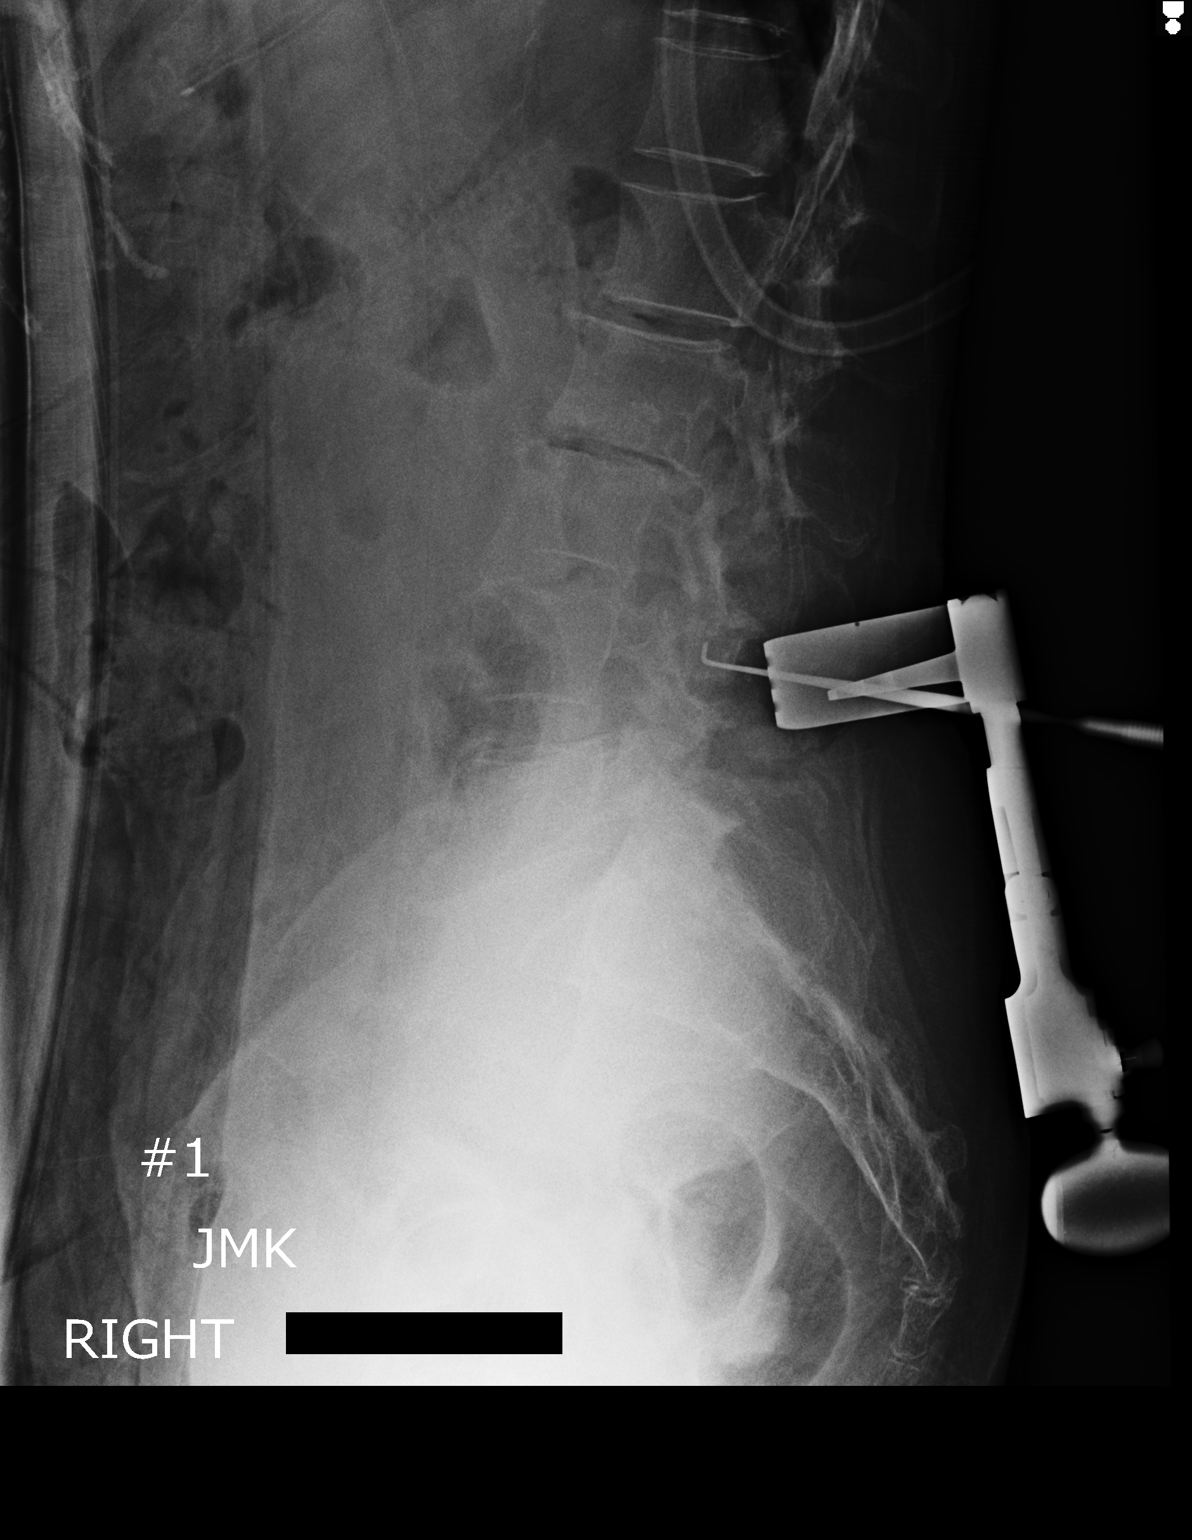

[1 of 1 positions shown; findings below may reference images not displayed]

FINDINGS: Cross-table lateral lumbar spine image labeled #1. Metallic probe
tip is posterior to the midportion of the L4 vertebral body. There
is marked disc space narrowing at L2-3 with moderately severe
narrowing at L3-4. No fracture or spondylolisthesis.
IMPRESSION: Metallic probe tip is posterior to the midportion of the L4
vertebral body. There is osteoarthritic changes several levels, most
marked at L2-3.

## 2017-01-21 DIAGNOSIS — G8929 Other chronic pain: Secondary | ICD-10-CM

## 2017-01-21 HISTORY — DX: Other chronic pain: G89.29

## 2018-12-05 ENCOUNTER — Other Ambulatory Visit: Payer: Self-pay

## 2018-12-05 NOTE — Patient Outreach (Signed)
[facility]) Care Management  12/05/2018  Colleen Watkins March 23, 1933 [id number]   Medication Adherence call to [name] spoke with patient she is due on Losartan 100 mg patient explain she is still taking 1 tablet daily and was waiting on pharmacy to have in stock patient will pick up today. [name] is showing past due under [facility].   [facility] Direct Dial [phone]  Fax [phone] [email or web address]

## 2019-01-28 ENCOUNTER — Ambulatory Visit (INDEPENDENT_AMBULATORY_CARE_PROVIDER_SITE_OTHER): Payer: Medicare Other | Admitting: Cardiology

## 2019-01-28 ENCOUNTER — Encounter: Payer: Self-pay | Admitting: Cardiology

## 2019-01-28 ENCOUNTER — Other Ambulatory Visit: Payer: Self-pay

## 2019-01-28 VITALS — BP 124/64 | HR 64 | Ht 61.0 in | Wt 139.4 lb

## 2019-01-28 DIAGNOSIS — R0609 Other forms of dyspnea: Secondary | ICD-10-CM

## 2019-01-28 DIAGNOSIS — I313 Pericardial effusion (noninflammatory): Secondary | ICD-10-CM | POA: Diagnosis not present

## 2019-01-28 DIAGNOSIS — M7989 Other specified soft tissue disorders: Secondary | ICD-10-CM

## 2019-01-28 DIAGNOSIS — R42 Dizziness and giddiness: Secondary | ICD-10-CM

## 2019-01-28 DIAGNOSIS — R0600 Dyspnea, unspecified: Secondary | ICD-10-CM

## 2019-01-28 DIAGNOSIS — I2510 Atherosclerotic heart disease of native coronary artery without angina pectoris: Secondary | ICD-10-CM

## 2019-01-28 DIAGNOSIS — I3139 Other pericardial effusion (noninflammatory): Secondary | ICD-10-CM

## 2019-01-28 HISTORY — DX: Other specified soft tissue disorders: M79.89

## 2019-01-28 HISTORY — DX: Other forms of dyspnea: R06.09

## 2019-01-28 HISTORY — DX: Dyspnea, unspecified: R06.00

## 2019-01-28 NOTE — Progress Notes (Signed)
Cardiology Consultation:    Date:  01/28/2019   ID:  Colleen Watkins, DOB [birth year], MRN [id number]  PCP:  Nicoletta Dress, MD  Cardiologist:  Jenne Campus, MD   Referring MD: Nicoletta Dress, MD   Chief Complaint  Patient presents with  . Follow-up  I have swollen ankles  History of Present Illness:    Colleen Watkins is a 85 y.o. female who is being seen today for the evaluation of swollen legs at the request of Nicoletta Dress, MD.  I have an honor of taking care of this lady few years ago at that time she was find to have pericardial effusion did not improve.  She disappeared from follow-up but now she would like to be seen because of swelling of lower extremities this is a gradual process that been going on for last few years.  Lately became worse to the point that she got difficulty putting shoes on.  She described also to have some dyspnea on exertion but no proximal nocturnal dyspnea.  She has to get up many times during the night to go to the restroom but not because of shortness of breath.  No palpitations no dizziness overall she said she is getting all but overall manage quite well she does have a dog named Dixie and she walks with her on the regular basis but very slow.  She describes to have some dizziness when she gets up to her head very quickly but also sometimes the dizziness happened at rest.  Never passed out  Past Medical History:  Diagnosis Date  . Anxiety   . Arthritis   . Cancer ([place])    skin  . Complication of anesthesia   . GERD (gastroesophageal reflux disease)    occ  . Hypertension   . PONV (postoperative nausea and vomiting)     Past Surgical History:  Procedure Laterality Date  . ANKLE FRACTURE SURGERY Right    25 yrs ago  . APPENDECTOMY    . BREAST SURGERY Left 2011   removed nodule-benign  . FOOT SURGERY Right    bunion 20 yrs ago  . KNEE ARTHROSCOPY Left 2000  . LAPAROTOMY  50   intestinal tortion  . LUMBAR  LAMINECTOMY/DECOMPRESSION MICRODISCECTOMY N/A 06/04/2013   Procedure: LUMBAR LAMINECTOMY/DECOMPRESSION MICRODISCECTOMY 1 LEVEL;  Surgeon: Eustace Moore, MD;  Location: [facility];  Service: Neurosurgery;  Laterality: N/A;  LUMBAR LAMINECTOMY/DECOMPRESSION MICRODISCECTOMY 1 LEVEL  . LUMBAR LAMINECTOMY/DECOMPRESSION MICRODISCECTOMY Left 02/17/2015   Procedure: LUMBAR LAMINECTOMY/DECOMPRESSION MICRODISCECTOMY LUMBAR THREE FOUR;  Surgeon: Eustace Moore, MD;  Location: [facility];  Service: Neurosurgery;  Laterality: Left;    Current Medications: Current Meds  Medication Sig  . ALPRAZolam (XANAX) 0.25 MG tablet Take 1 tablet by mouth 3 (three) times daily.  Marland Kitchen escitalopram (LEXAPRO) 10 MG tablet Take 1 tablet by mouth daily.  . furosemide (LASIX) 40 MG tablet Take by mouth daily as needed for fluid. 1 to 1 1/2 tablets daily  . labetalol (NORMODYNE) 300 MG tablet Take 1 tablet by mouth 2 (two) times daily.  Marland Kitchen losartan (COZAAR) 100 MG tablet Take 1 tablet by mouth daily.  . pantoprazole (PROTONIX) 40 MG tablet Take 1 tablet by mouth daily.  . traMADol (ULTRAM) 50 MG tablet Take 50 mg by mouth as needed.     Allergies:   Oxycodone, Penicillins, Codeine, Hydrocortisone, and Sulfa antibiotics   Social History   Socioeconomic History  . Marital status: Widowed    Spouse name:  Not on file  . Number of children: Not on file  . Years of education: Not on file  . Highest education level: Not on file  Occupational History  . Not on file  Social Needs  . Financial resource strain: Not on file  . Food insecurity    Worry: Not on file    Inability: Not on file  . Transportation needs    Medical: Not on file    Non-medical: Not on file  Tobacco Use  . Smoking status: Never Smoker  . Smokeless tobacco: Never Used  Substance and Sexual Activity  . Alcohol use: No  . Drug use: No  . Sexual activity: Not on file  Lifestyle  . Physical activity    Days per week: Not on file    Minutes per  session: Not on file  . Stress: Not on file  Relationships  . Social Herbalist on phone: Not on file    Gets together: Not on file    Attends religious service: Not on file    Active member of club or organization: Not on file    Attends meetings of clubs or organizations: Not on file    Relationship status: Not on file  Other Topics Concern  . Not on file  Social History Narrative  . Not on file     Family History: The patient's family history includes Breast cancer in her daughter; Diabetes in her sister; Ovarian cancer in her mother. ROS:   Please see the history of present illness.    All 14 point review of systems negative except as described per history of present illness.  EKGs/Labs/Other Studies Reviewed:    The following studies were reviewed today:   EKG:  EKG is  ordered today.  The ekg ordered today demonstrates EKG shows sinus rhythm with some APCs, right bundle branch block with left anterior fascicular block.  Possible left lateral infarct.  Recent Labs: No results found for requested labs within last 8760 hours.  Recent Lipid Panel No results found for: CHOL, TRIG, HDL, CHOLHDL, VLDL, LDLCALC, LDLDIRECT  Physical Exam:    VS:  BP 124/64   Pulse 64   Ht 5\' 1"  (1.549 m)   Wt 139 lb 6.4 oz (63.2 kg)   SpO2 98%   BMI 26.34 kg/m     Wt Readings from Last 3 Encounters:  01/28/19 139 lb 6.4 oz (63.2 kg)  02/17/15 162 lb (73.5 kg)  02/11/15 162 lb (73.5 kg)     GEN:  Well nourished, well developed in no acute distress HEENT: Normal NECK: No JVD; No carotid bruits LYMPHATICS: No lymphadenopathy CARDIAC: RRR, no murmurs, no rubs, no gallops RESPIRATORY:  Clear to auscultation without rales, wheezing or rhonchi  ABDOMEN: Soft, non-tender, non-distended MUSCULOSKELETAL:  No edema; No deformity  SKIN: Warm and dry NEUROLOGIC:  Alert and oriented x 3 PSYCHIATRIC:  Normal affect   ASSESSMENT:    1. Coronary artery disease involving native  coronary artery of native heart without angina pectoris   2. Dizziness   3. Pericardial effusion   4. Swelling of both lower extremities   5. Dyspnea on exertion    PLAN:    In order of problems listed above:  1. Coronary artery disease previously no recent events.  We will continue monitoring 2. Dizziness I will schedule her to have monitor for 7 days. 3. Pericardial effusion.  No sign of tamponade no JVD when laying flat I will ask  you to have echocardiogram done to check left ventricular ejection fraction prior to see if she got any systolic or diastolic congestive heart failure plans to check for Procardia fusion 4. Echocardiogram will be done to check left ventricular ejection fraction 5. Dyspnea on exertion we will do echocardiogram.   Medication Adjustments/Labs and Tests Ordered: Current medicines are reviewed at length with the patient today.  Concerns regarding medicines are outlined above.  Orders Placed This Encounter  Procedures  . LONG TERM MONITOR (3-14 DAYS)  . ECHOCARDIOGRAM COMPLETE   No orders of the defined types were placed in this encounter.   Signed, Park Liter, MD, [facility]. 01/28/2019 4:53 PM    [facility]

## 2019-01-28 NOTE — Patient Instructions (Signed)
Medication Instructions:  Your physician recommends that you continue on your current medications as directed. Please refer to the Current Medication list given to you today.  If you need a refill on your cardiac medications before your next appointment, please call your pharmacy.   Lab work: None.  If you have labs (blood work) drawn today and your tests are completely normal, you will receive your results only by: Marland Kitchen MyChart Message (if you have MyChart) OR . A paper copy in the mail If you have any lab test that is abnormal or we need to change your treatment, we will call you to review the results.  Testing/Procedures: Your physician has requested that you have an echocardiogram. Echocardiography is a painless test that uses sound waves to create images of your heart. It provides your doctor with information about the size and shape of your heart and how well your heart's chambers and valves are working. This procedure takes approximately one hour. There are no restrictions for this procedure.  Your physician has recommended that you wear a holter monitor. Holter monitors are medical devices that record the heart's electrical activity. Doctors most often use these monitors to diagnose arrhythmias. Arrhythmias are problems with the speed or rhythm of the heartbeat. The monitor is a small, portable device. You can wear one while you do your normal daily activities. This is usually used to diagnose what is causing palpitations/syncope (passing out). Wear for 7 days.     Follow-Up: At [facility], you and your health needs are our priority.  As part of our continuing mission to provide you with exceptional heart care, we have created designated Provider Care Teams.  These Care Teams include your primary Cardiologist (physician) and Advanced Practice Providers (APPs -  Physician Assistants and Nurse Practitioners) who all work together to provide you with the care you need, when you need it.  You will need a follow up appointment in 1 months.  Please call our office 2 months in advance to schedule this appointment.  You may see No primary care provider on file. or another member of our [employer] Provider Team in [place]: Shirlee More, MD . Jyl Heinz, MD  Any Other Special Instructions Will Be Listed Below (If Applicable).   Echocardiogram An echocardiogram is a procedure that uses painless sound waves (ultrasound) to produce an image of the heart. Images from an echocardiogram can provide important information about:  Signs of coronary artery disease (CAD).  Aneurysm detection. An aneurysm is a weak or damaged part of an artery wall that bulges out from the normal force of blood pumping through the body.  Heart size and shape. Changes in the size or shape of the heart can be associated with certain conditions, including heart failure, aneurysm, and CAD.  Heart muscle function.  Heart valve function.  Signs of a past heart attack.  Fluid buildup around the heart.  Thickening of the heart muscle.  A tumor or infectious growth around the heart valves. Tell a health care provider about:  Any allergies you have.  All medicines you are taking, including vitamins, herbs, eye drops, creams, and over-the-counter medicines.  Any blood disorders you have.  Any surgeries you have had.  Any medical conditions you have.  Whether you are pregnant or may be pregnant. What are the risks? Generally, this is a safe procedure. However, problems may occur, including:  Allergic reaction to dye (contrast) that may be used during the procedure. What happens before the procedure?  No specific preparation is needed. You may eat and drink normally. What happens during the procedure?   An IV tube may be inserted into one of your veins.  You may receive contrast through this tube. A contrast is an injection that improves the quality of the pictures from your heart.  A  gel will be applied to your chest.  A wand-like tool (transducer) will be moved over your chest. The gel will help to transmit the sound waves from the transducer.  The sound waves will harmlessly bounce off of your heart to allow the heart images to be captured in real-time motion. The images will be recorded on a computer. The procedure may vary among health care providers and hospitals. What happens after the procedure?  You may return to your normal, everyday life, including diet, activities, and medicines, unless your health care provider tells you not to do that. Summary  An echocardiogram is a procedure that uses painless sound waves (ultrasound) to produce an image of the heart.  Images from an echocardiogram can provide important information about the size and shape of your heart, heart muscle function, heart valve function, and fluid buildup around your heart.  You do not need to do anything to prepare before this procedure. You may eat and drink normally.  After the echocardiogram is completed, you may return to your normal, everyday life, unless your health care provider tells you not to do that. This information is not intended to replace advice given to you by your health care provider. Make sure you discuss any questions you have with your health care provider. Document Released: 07/27/2000 Document Revised: 09/01/2016 Document Reviewed: 09/01/2016 Elsevier Interactive Patient Education  2019 [employer].

## 2019-01-29 NOTE — Addendum Note (Signed)
Addended by: Ashok Norris on: 01/29/2019 08:26 AM   Modules accepted: Orders

## 2019-02-03 ENCOUNTER — Other Ambulatory Visit (INDEPENDENT_AMBULATORY_CARE_PROVIDER_SITE_OTHER): Payer: Medicare Other

## 2019-02-03 DIAGNOSIS — I2510 Atherosclerotic heart disease of native coronary artery without angina pectoris: Secondary | ICD-10-CM | POA: Diagnosis not present

## 2019-02-03 DIAGNOSIS — R42 Dizziness and giddiness: Secondary | ICD-10-CM

## 2019-03-12 ENCOUNTER — Other Ambulatory Visit: Payer: Self-pay

## 2019-03-12 ENCOUNTER — Ambulatory Visit (INDEPENDENT_AMBULATORY_CARE_PROVIDER_SITE_OTHER): Payer: Medicare Other

## 2019-03-12 ENCOUNTER — Other Ambulatory Visit: Payer: Medicare Other

## 2019-03-12 DIAGNOSIS — I2510 Atherosclerotic heart disease of native coronary artery without angina pectoris: Secondary | ICD-10-CM | POA: Diagnosis not present

## 2019-03-12 NOTE — Progress Notes (Signed)
Complete echocardiogram has been performed.  Jimmy Dylen Mcelhannon RDCS, RVT 

## 2019-03-30 ENCOUNTER — Telehealth: Payer: Self-pay | Admitting: Emergency Medicine

## 2019-03-30 NOTE — Telephone Encounter (Signed)
Called patient informed her of echocardiogram results. Advised her to call when she is able to get a follow up appointment. She is checking with daughter and will let us know what time will work for her. No further questions.

## 2019-04-01 ENCOUNTER — Ambulatory Visit: Payer: Medicare Other | Admitting: Cardiology

## 2019-04-01 NOTE — Telephone Encounter (Signed)
Called patient and informed her of monitor results. Confirmed her follow up appointment.

## 2019-04-02 ENCOUNTER — Ambulatory Visit: Payer: Medicare Other | Admitting: Cardiology

## 2019-04-06 ENCOUNTER — Other Ambulatory Visit: Payer: Self-pay

## 2019-04-06 ENCOUNTER — Ambulatory Visit (INDEPENDENT_AMBULATORY_CARE_PROVIDER_SITE_OTHER): Payer: Medicare Other | Admitting: Cardiology

## 2019-04-06 VITALS — BP 154/80 | HR 114 | Ht 61.0 in | Wt 140.4 lb

## 2019-04-06 DIAGNOSIS — I313 Pericardial effusion (noninflammatory): Secondary | ICD-10-CM | POA: Diagnosis not present

## 2019-04-06 DIAGNOSIS — M7989 Other specified soft tissue disorders: Secondary | ICD-10-CM

## 2019-04-06 DIAGNOSIS — I2510 Atherosclerotic heart disease of native coronary artery without angina pectoris: Secondary | ICD-10-CM | POA: Diagnosis not present

## 2019-04-06 DIAGNOSIS — I3139 Other pericardial effusion (noninflammatory): Secondary | ICD-10-CM

## 2019-04-06 DIAGNOSIS — R0609 Other forms of dyspnea: Secondary | ICD-10-CM | POA: Diagnosis not present

## 2019-04-06 NOTE — Progress Notes (Signed)
Cardiology Office Note:    Date:  04/06/2019   ID:  Colleen Watkins, DOB Apr 10, 1933, MRN [id number]  PCP:  Nicoletta Dress, MD  Cardiologist:  Jenne Campus, MD    Referring MD: Nicoletta Dress, MD   Chief Complaint  Patient presents with  . Follow up on test results    History of Present Illness:    Colleen Watkins is a 85 y.o. female who returned to me as a patient because of swelling of lower extremities she does have some pitting edema however lymphedema is there as well.  Last time I have seen can increase dose of diuretic however it was cut down again by primary care physician I suspect secondary to kidney dysfunction.  She did have echocardiogram which showed preserved left ventricular ejection fraction.  She does have moderate pericardial effusion 2.5 cm performed 1.3 in the back.  She is hemodynamically stable she does not have any signs and symptoms of tamponade.  Laying flat in the bed she does not have any JVD.  Overall doing for is afraid to walk around too much because of unsteady gait no chest pain no tightness no pressure no burning no squeezing in the chest.  Does have some exertional shortness of breath.  Denies having any palpitations  Past Medical History:  Diagnosis Date  . Anxiety   . Arthritis   . Cancer ([place])    skin  . Complication of anesthesia   . GERD (gastroesophageal reflux disease)    occ  . Hypertension   . PONV (postoperative nausea and vomiting)     Past Surgical History:  Procedure Laterality Date  . ANKLE FRACTURE SURGERY Right    25 yrs ago  . APPENDECTOMY    . BREAST SURGERY Left 2011   removed nodule-benign  . FOOT SURGERY Right    bunion 20 yrs ago  . KNEE ARTHROSCOPY Left 2000  . LAPAROTOMY  50   intestinal tortion  . LUMBAR LAMINECTOMY/DECOMPRESSION MICRODISCECTOMY N/A 06/04/2013   Procedure: LUMBAR LAMINECTOMY/DECOMPRESSION MICRODISCECTOMY 1 LEVEL;  Surgeon: Eustace Moore, MD;  Location: [place] [facility];  Service: Neurosurgery;   Laterality: N/A;  LUMBAR LAMINECTOMY/DECOMPRESSION MICRODISCECTOMY 1 LEVEL  . LUMBAR LAMINECTOMY/DECOMPRESSION MICRODISCECTOMY Left 02/17/2015   Procedure: LUMBAR LAMINECTOMY/DECOMPRESSION MICRODISCECTOMY LUMBAR THREE FOUR;  Surgeon: Eustace Moore, MD;  Location: [place] [facility];  Service: Neurosurgery;  Laterality: Left;    Current Medications: Current Meds  Medication Sig  . ALPRAZolam (XANAX) 0.25 MG tablet Take 1 tablet by mouth 3 (three) times daily.  Marland Kitchen escitalopram (LEXAPRO) 10 MG tablet Take 1 tablet by mouth daily.  . furosemide (LASIX) 40 MG tablet Take by mouth daily as needed for fluid. 1 to 1 1/2 tablets daily  . labetalol (NORMODYNE) 300 MG tablet Take 1 tablet by mouth 2 (two) times daily.  Marland Kitchen losartan (COZAAR) 100 MG tablet Take 1 tablet by mouth daily.  . pantoprazole (PROTONIX) 40 MG tablet Take 1 tablet by mouth daily.  . traMADol (ULTRAM) 50 MG tablet Take 50 mg by mouth as needed.     Allergies:   Oxycodone, Penicillins, Codeine, Hydrocortisone, and Sulfa antibiotics   Social History   Socioeconomic History  . Marital status: Widowed    Spouse name: Not on file  . Number of children: Not on file  . Years of education: Not on file  . Highest education level: Not on file  Occupational History  . Not on file  Social Needs  . Financial resource strain: Not on  file  . Food insecurity    Worry: Not on file    Inability: Not on file  . Transportation needs    Medical: Not on file    Non-medical: Not on file  Tobacco Use  . Smoking status: Never Smoker  . Smokeless tobacco: Never Used  Substance and Sexual Activity  . Alcohol use: No  . Drug use: No  . Sexual activity: Not on file  Lifestyle  . Physical activity    Days per week: Not on file    Minutes per session: Not on file  . Stress: Not on file  Relationships  . Social Herbalist on phone: Not on file    Gets together: Not on file    Attends religious service: Not on file    Active member of  club or organization: Not on file    Attends meetings of clubs or organizations: Not on file    Relationship status: Not on file  Other Topics Concern  . Not on file  Social History Narrative  . Not on file     Family History: The patient's family history includes Breast cancer in her daughter; Diabetes in her sister; Ovarian cancer in her mother. ROS:   Please see the history of present illness.    All 14 point review of systems negative except as described per history of present illness  EKGs/Labs/Other Studies Reviewed:      Recent Labs: No results found for requested labs within last 8760 hours.  Recent Lipid Panel No results found for: CHOL, TRIG, HDL, CHOLHDL, VLDL, LDLCALC, LDLDIRECT  Physical Exam:    VS:  BP (!) 154/80   Pulse (!) 114   Ht 5\' 1"  (1.549 m)   Wt 140 lb 6.4 oz (63.7 kg)   SpO2 95%   BMI 26.53 kg/m     Wt Readings from Last 3 Encounters:  04/06/19 140 lb 6.4 oz (63.7 kg)  01/28/19 139 lb 6.4 oz (63.2 kg)  02/17/15 162 lb (73.5 kg)     GEN:  Well nourished, well developed in no acute distress HEENT: Normal NECK: No JVD; No carotid bruits LYMPHATICS: No lymphadenopathy CARDIAC: RRR, no murmurs, no rubs, no gallops RESPIRATORY:  Clear to auscultation without rales, wheezing or rhonchi  ABDOMEN: Soft, non-tender, non-distended MUSCULOSKELETAL:  No edema; No deformity  SKIN: Warm and dry LOWER EXTREMITIES: no swelling NEUROLOGIC:  Alert and oriented x 3 PSYCHIATRIC:  Normal affect   ASSESSMENT:    1. Pericardial effusion   2. Coronary artery disease involving native coronary artery of native heart without angina pectoris   3. Dyspnea on exertion   4. Swelling of both lower extremities    PLAN:    In order of problems listed above:  1. Pericardial effusion moderate by last echocardiogram.  Just about the same like it was before.  We will continue watching at this situation.  She is asymptomatic without signs of tamponade will check  echocardiogram 6 months from now. 2. Coronary disease stable on appropriate medication which I will continue. 3. Dyspnea on exertion echocardiogram showed preserved ejection fraction no signs of tamponade 4. Swelling of lower extremities.  We talked about potentially wearing elastic stockings I call primary care physician to get her Chem-7.  We also talk about massages of her lower extremities.   Medication Adjustments/Labs and Tests Ordered: Current medicines are reviewed at length with the patient today.  Concerns regarding medicines are outlined above.  No orders of  the defined types were placed in this encounter.  Medication changes: No orders of the defined types were placed in this encounter.   Signed, Park Liter, MD, [facility] 04/06/2019 2:21 PM    [place]

## 2019-04-06 NOTE — Patient Instructions (Signed)
Medication Instructions:  Your physician recommends that you continue on your current medications as directed. Please refer to the Current Medication list given to you today.  If you need a refill on your cardiac medications before your next appointment, please call your pharmacy.   Lab work: None.  If you have labs (blood work) drawn today and your tests are completely normal, you will receive your results only by: . MyChart Message (if you have MyChart) OR . A paper copy in the mail If you have any lab test that is abnormal or we need to change your treatment, we will call you to review the results.  Testing/Procedures: None.   Follow-Up: At [facility], you and your health needs are our priority.  As part of our continuing mission to provide you with exceptional heart care, we have created designated Provider Care Teams.  These Care Teams include your primary Cardiologist (physician) and Advanced Practice Providers (APPs -  Physician Assistants and Nurse Practitioners) who all work together to provide you with the care you need, when you need it. You will need a follow up appointment in 3 months.  Please call our office 2 months in advance to schedule this appointment.  You may see No primary care provider on file. or another member of our [facility] Provider Team in [place]: Brian Munley, MD . Rajan Revankar, MD  Any Other Special Instructions Will Be Listed Below (If Applicable).     

## 2019-06-15 ENCOUNTER — Encounter: Payer: Self-pay | Admitting: Neurology

## 2019-06-15 ENCOUNTER — Other Ambulatory Visit: Payer: Self-pay

## 2019-06-15 DIAGNOSIS — R202 Paresthesia of skin: Secondary | ICD-10-CM

## 2019-06-29 DIAGNOSIS — R109 Unspecified abdominal pain: Secondary | ICD-10-CM

## 2019-06-29 DIAGNOSIS — N83209 Unspecified ovarian cyst, unspecified side: Secondary | ICD-10-CM

## 2019-06-29 HISTORY — DX: Unspecified ovarian cyst, unspecified side: N83.209

## 2019-06-29 HISTORY — DX: Unspecified abdominal pain: R10.9

## 2019-06-30 ENCOUNTER — Ambulatory Visit (INDEPENDENT_AMBULATORY_CARE_PROVIDER_SITE_OTHER): Payer: Medicare Other | Admitting: Neurology

## 2019-06-30 ENCOUNTER — Other Ambulatory Visit: Payer: Self-pay

## 2019-06-30 DIAGNOSIS — G5603 Carpal tunnel syndrome, bilateral upper limbs: Secondary | ICD-10-CM

## 2019-06-30 DIAGNOSIS — R202 Paresthesia of skin: Secondary | ICD-10-CM

## 2019-06-30 NOTE — Procedures (Signed)
[facility]  [place]  [place], [place] [address] Tel: [phone] Fax:  [phone] Test Date:  06/30/2019  Patient: Colleen Watkins DOB: Apr 29, 1933 Physician: Narda Amber, DO  Sex: Female Height: 5\' 1"  Ref Phys: Charlotte Crumb, MD  ID#: [id number] Temp: 33.0C Technician:    Patient Complaints: This is a 85 year old female referred for evaluation of bilateral hand pain and tingling, worse on the left.  NCV & EMG Findings: Extensive electrodiagnostic testing of the left upper extremity and additional studies of the right shows:  1. Left median sensory response shows prolonged latency (4.7 ms) and reduced amplitude (8.2 V).  Right mixed palmar sensory responses show prolonged latency.  Right median and bilateral ulnar sensory responses are within normal limits. 2. Bilateral median motor responses show prolonged latency (L5.0, R4.3 ms).  Bilateral ulnar motor responses are within normal limits.   3. Chronic motor axonal loss changes received affecting the left abductor pollicis brevis muscle.  These findings are not present on the right.    Impression: 1. Left median neuropathy at or distal to the wrist (moderate-to-severe), consistent with a clinical diagnosis of carpal tunnel syndrome.   2. Right median neuropathy at or distal to the wrist (moderate), consistent with a clinical diagnosis of carpal tunnel syndrome.     ___________________________ Narda Amber, DO    Nerve Conduction Studies Anti Sensory Summary Table   Site NR Peak (ms) Norm Peak (ms) P-T Amp (V) Norm P-T Amp  Left Median Anti Sensory (2nd Digit)  33C  Wrist    4.7 <3.8 8.2 >10  Right Median Anti Sensory (2nd Digit)  33C  Wrist    3.6 <3.8 18.6 >10  Left Ulnar Anti Sensory (5th Digit)  33C  Wrist    2.8 <3.2 11.5 >5  Right Ulnar Anti Sensory (5th Digit)  33C  Wrist    2.9 <3.2 14.1 >5   Motor Summary Table   Site NR Onset (ms) Norm Onset (ms) O-P Amp (mV) Norm O-P Amp  Site1 Site2 Delta-0 (ms) Dist (cm) Vel (m/s) Norm Vel (m/s)  Left Median Motor (Abd Poll Brev)  33C  Wrist    5.0 <4.0 6.2 >5 Elbow Wrist 4.8 26.0 54 >50  Elbow    9.8  5.9         Right Median Motor (Abd Poll Brev)  33C  Wrist    4.3 <4.0 6.5 >5 Elbow Wrist 4.5 26.0 58 >50  Elbow    8.8  6.5         Left Ulnar Motor (Abd Dig Minimi)  33C  Wrist    2.5 <3.1 8.8 >7 B Elbow Wrist 3.6 20.0 56 >50  B Elbow    6.1  8.4  A Elbow B Elbow 1.9 10.0 53 >50  A Elbow    8.0  8.0         Right Ulnar Motor (Abd Dig Minimi)  33C  Wrist    2.3 <3.1 10.5 >7 B Elbow Wrist 3.6 20.0 56 >50  B Elbow    5.9  9.3  A Elbow B Elbow 1.7 10.0 59 >50  A Elbow    7.6  9.1          Comparison Summary Table   Site NR Peak (ms) Norm Peak (ms) P-T Amp (V) Site1 Site2 Delta-P (ms) Norm Delta (ms)  Right Median/Ulnar Palm Comparison (Wrist - 8cm)  33C  Median Palm    2.3 <2.2 18.3 Median Palm  Ulnar Palm 0.5   Ulnar Palm    1.8 <2.2 7.4       EMG   Side Muscle Ins Act Fibs Psw Fasc Number Recrt Dur Dur. Amp Amp. Poly Poly. Comment  Right 1stDorInt Nml Nml Nml Nml Nml Nml Nml Nml Nml Nml Nml Nml N/A  Right Abd Poll Brev Nml Nml Nml Nml Nml Nml Nml Nml Nml Nml Nml Nml N/A  Right PronatorTeres Nml Nml Nml Nml Nml Nml Nml Nml Nml Nml Nml Nml N/A  Right Biceps Nml Nml Nml Nml Nml Nml Nml Nml Nml Nml Nml Nml N/A  Right Triceps Nml Nml Nml Nml Nml Nml Nml Nml Nml Nml Nml Nml N/A  Right Deltoid Nml Nml Nml Nml Nml Nml Nml Nml Nml Nml Nml Nml N/A  Left 1stDorInt Nml Nml Nml Nml Nml Nml Nml Nml Nml Nml Nml Nml N/A  Left Abd Poll Brev Nml Nml Nml Nml 1- Rapid Some 1+ Some 1+ Some 1+ N/A  Left PronatorTeres Nml Nml Nml Nml Nml Nml Nml Nml Nml Nml Nml Nml N/A  Left Biceps Nml Nml Nml Nml Nml Nml Nml Nml Nml Nml Nml Nml N/A  Left Triceps Nml Nml Nml Nml Nml Nml Nml Nml Nml Nml Nml Nml N/A  Left Deltoid Nml Nml Nml Nml Nml Nml Nml Nml Nml Nml Nml Nml N/A      Waveforms:

## 2019-07-07 ENCOUNTER — Other Ambulatory Visit: Payer: Self-pay

## 2019-07-07 ENCOUNTER — Encounter: Payer: Self-pay | Admitting: Cardiology

## 2019-07-07 ENCOUNTER — Ambulatory Visit (INDEPENDENT_AMBULATORY_CARE_PROVIDER_SITE_OTHER): Payer: Medicare Other | Admitting: Cardiology

## 2019-07-07 VITALS — BP 126/60 | HR 85 | Ht 61.0 in | Wt 142.0 lb

## 2019-07-07 DIAGNOSIS — R0609 Other forms of dyspnea: Secondary | ICD-10-CM

## 2019-07-07 DIAGNOSIS — I10 Essential (primary) hypertension: Secondary | ICD-10-CM

## 2019-07-07 DIAGNOSIS — I2510 Atherosclerotic heart disease of native coronary artery without angina pectoris: Secondary | ICD-10-CM

## 2019-07-07 DIAGNOSIS — R0600 Dyspnea, unspecified: Secondary | ICD-10-CM

## 2019-07-07 DIAGNOSIS — I3139 Other pericardial effusion (noninflammatory): Secondary | ICD-10-CM

## 2019-07-07 DIAGNOSIS — I313 Pericardial effusion (noninflammatory): Secondary | ICD-10-CM

## 2019-07-07 NOTE — Patient Instructions (Signed)
Medication Instructions:  Your physician recommends that you continue on your current medications as directed. Please refer to the Current Medication list given to you today.  *If you need a refill on your cardiac medications before your next appointment, please call your pharmacy*  Lab Work: Your physician recommends that you return for lab work today: bmp   If you have labs (blood work) drawn today and your tests are completely normal, you will receive your results only by: Marland Kitchen MyChart Message (if you have MyChart) OR . A paper copy in the mail If you have any lab test that is abnormal or we need to change your treatment, we will call you to review the results.  Testing/Procedures: Your physician has requested that you have an echocardiogram. Echocardiography is a painless test that uses sound waves to create images of your heart. It provides your doctor with information about the size and shape of your heart and how well your heart's chambers and valves are working. This procedure takes approximately one hour. There are no restrictions for this procedure.    Follow-Up: At [facility], you and your health needs are our priority.  As part of our continuing mission to provide you with exceptional heart care, we have created designated Provider Care Teams.  These Care Teams include your primary Cardiologist (physician) and Advanced Practice Providers (APPs -  Physician Assistants and Nurse Practitioners) who all work together to provide you with the care you need, when you need it.  Your next appointment:   5 month(s)  The format for your next appointment:   In Person  Provider:   Jenne Campus, MD  Other Instructions   Echocardiogram An echocardiogram is a procedure that uses painless sound waves (ultrasound) to produce an image of the heart. Images from an echocardiogram can provide important information about:  Signs of coronary artery disease (CAD).  Aneurysm detection.  An aneurysm is a weak or damaged part of an artery wall that bulges out from the normal force of blood pumping through the body.  Heart size and shape. Changes in the size or shape of the heart can be associated with certain conditions, including heart failure, aneurysm, and CAD.  Heart muscle function.  Heart valve function.  Signs of a past heart attack.  Fluid buildup around the heart.  Thickening of the heart muscle.  A tumor or infectious growth around the heart valves. Tell a health care provider about:  Any allergies you have.  All medicines you are taking, including vitamins, herbs, eye drops, creams, and over-the-counter medicines.  Any blood disorders you have.  Any surgeries you have had.  Any medical conditions you have.  Whether you are pregnant or may be pregnant. What are the risks? Generally, this is a safe procedure. However, problems may occur, including:  Allergic reaction to dye (contrast) that may be used during the procedure. What happens before the procedure? No specific preparation is needed. You may eat and drink normally. What happens during the procedure?   An IV tube may be inserted into one of your veins.  You may receive contrast through this tube. A contrast is an injection that improves the quality of the pictures from your heart.  A gel will be applied to your chest.  A wand-like tool (transducer) will be moved over your chest. The gel will help to transmit the sound waves from the transducer.  The sound waves will harmlessly bounce off of your heart to allow the heart images  to be captured in real-time motion. The images will be recorded on a computer. The procedure may vary among health care providers and hospitals. What happens after the procedure?  You may return to your normal, everyday life, including diet, activities, and medicines, unless your health care provider tells you not to do that. Summary  An echocardiogram is a  procedure that uses painless sound waves (ultrasound) to produce an image of the heart.  Images from an echocardiogram can provide important information about the size and shape of your heart, heart muscle function, heart valve function, and fluid buildup around your heart.  You do not need to do anything to prepare before this procedure. You may eat and drink normally.  After the echocardiogram is completed, you may return to your normal, everyday life, unless your health care provider tells you not to do that. This information is not intended to replace advice given to you by your health care provider. Make sure you discuss any questions you have with your health care provider. Document Released: 07/27/2000 Document Revised: 11/20/2018 Document Reviewed: 09/01/2016 Elsevier Patient Education  2020 [employer].

## 2019-07-07 NOTE — Progress Notes (Signed)
Cardiology Office Note:    Date:  07/07/2019   ID:  Colleen Watkins, DOB 14-Mar-1933, MRN [id number]  PCP:  Nicoletta Dress, MD  Cardiologist:  Jenne Campus, MD    Referring MD: Nicoletta Dress, MD   No chief complaint on file. Doing well  History of Present Illness:    Colleen Watkins is a 85 y.o. female with history of pericardial effusion which was moderate but no evidence of tamponade, essential hypertension, some swelling of lower extremities coronary artery disease.  Comes today to my office for follow-up of all doing well described to have intermediate swelling of lower extremities but overall does not bother her much.  Denies having any exertional shortness of breath still trying to be active and walk with her dog called Dixie.  Past Medical History:  Diagnosis Date  . Anxiety   . Arthritis   . Cancer ([place])    skin  . Complication of anesthesia   . GERD (gastroesophageal reflux disease)    occ  . Hypertension   . PONV (postoperative nausea and vomiting)     Past Surgical History:  Procedure Laterality Date  . ANKLE FRACTURE SURGERY Right    25 yrs ago  . APPENDECTOMY    . BREAST SURGERY Left 2011   removed nodule-benign  . FOOT SURGERY Right    bunion 20 yrs ago  . KNEE ARTHROSCOPY Left 2000  . LAPAROTOMY  50   intestinal tortion  . LUMBAR LAMINECTOMY/DECOMPRESSION MICRODISCECTOMY N/A 06/04/2013   Procedure: LUMBAR LAMINECTOMY/DECOMPRESSION MICRODISCECTOMY 1 LEVEL;  Surgeon: Eustace Moore, MD;  Location: [place] [facility];  Service: Neurosurgery;  Laterality: N/A;  LUMBAR LAMINECTOMY/DECOMPRESSION MICRODISCECTOMY 1 LEVEL  . LUMBAR LAMINECTOMY/DECOMPRESSION MICRODISCECTOMY Left 02/17/2015   Procedure: LUMBAR LAMINECTOMY/DECOMPRESSION MICRODISCECTOMY LUMBAR THREE FOUR;  Surgeon: Eustace Moore, MD;  Location: [place] [facility];  Service: Neurosurgery;  Laterality: Left;    Current Medications: Current Meds  Medication Sig  . ALPRAZolam (XANAX) 0.25 MG tablet Take 1  tablet by mouth 3 (three) times daily.  Marland Kitchen escitalopram (LEXAPRO) 10 MG tablet Take 1 tablet by mouth daily.  . furosemide (LASIX) 40 MG tablet Take by mouth daily as needed for fluid. 1 to 1 1/2 tablets daily  . labetalol (NORMODYNE) 300 MG tablet Take 1 tablet by mouth 2 (two) times daily.  Marland Kitchen losartan (COZAAR) 100 MG tablet Take 1 tablet by mouth daily.  . pantoprazole (PROTONIX) 40 MG tablet Take 1 tablet by mouth daily.  . traMADol (ULTRAM) 50 MG tablet Take 50 mg by mouth as needed.     Allergies:   Oxycodone, Penicillins, Codeine, Hydrocortisone, and Sulfa antibiotics   Social History   Socioeconomic History  . Marital status: Widowed    Spouse name: Not on file  . Number of children: Not on file  . Years of education: Not on file  . Highest education level: Not on file  Occupational History  . Not on file  Social Needs  . Financial resource strain: Not on file  . Food insecurity    Worry: Not on file    Inability: Not on file  . Transportation needs    Medical: Not on file    Non-medical: Not on file  Tobacco Use  . Smoking status: Never Smoker  . Smokeless tobacco: Never Used  Substance and Sexual Activity  . Alcohol use: No  . Drug use: No  . Sexual activity: Not on file  Lifestyle  . Physical activity    Days per  week: Not on file    Minutes per session: Not on file  . Stress: Not on file  Relationships  . Social Herbalist on phone: Not on file    Gets together: Not on file    Attends religious service: Not on file    Active member of club or organization: Not on file    Attends meetings of clubs or organizations: Not on file    Relationship status: Not on file  Other Topics Concern  . Not on file  Social History Narrative  . Not on file     Family History: The patient's family history includes Breast cancer in her daughter; Diabetes in her sister; Ovarian cancer in her mother. ROS:   Please see the history of present illness.    All 14  point review of systems negative except as described per history of present illness  EKGs/Labs/Other Studies Reviewed:      Recent Labs: No results found for requested labs within last 8760 hours.  Recent Lipid Panel No results found for: CHOL, TRIG, HDL, CHOLHDL, VLDL, LDLCALC, LDLDIRECT  Physical Exam:    VS:  BP 126/60   Pulse 85   Ht 5\' 1"  (1.549 m)   Wt 142 lb (64.4 kg)   SpO2 97%   BMI 26.83 kg/m     Wt Readings from Last 3 Encounters:  07/07/19 142 lb (64.4 kg)  04/06/19 140 lb 6.4 oz (63.7 kg)  01/28/19 139 lb 6.4 oz (63.2 kg)     GEN:  Well nourished, well developed in no acute distress HEENT: Normal NECK: No JVD; No carotid bruits LYMPHATICS: No lymphadenopathy CARDIAC: RRR, no murmurs, no rubs, no gallops RESPIRATORY:  Clear to auscultation without rales, wheezing or rhonchi  ABDOMEN: Soft, non-tender, non-distended MUSCULOSKELETAL:  No edema; No deformity  SKIN: Warm and dry LOWER EXTREMITIES: no swelling NEUROLOGIC:  Alert and oriented x 3 PSYCHIATRIC:  Normal affect   ASSESSMENT:    1. Pericardial effusion   2. Essential hypertension   3. Coronary artery disease involving native coronary artery of native heart without angina pectoris   4. Dyspnea on exertion    PLAN:    In order of problems listed above:  1. Pericardial effusion.  We will repeat echocardiogram in January or February.  We will continue present management. 2. Essential hypertension blood pressure well controlled continue present management. 3. Coronary disease stable from that point review continue present management. 4. Dyspnea on exertion doing well from that point review. 5. I will ask her to have Chem-7 done today.  We will see her back in about 5 months   Medication Adjustments/Labs and Tests Ordered: Current medicines are reviewed at length with the patient today.  Concerns regarding medicines are outlined above.  No orders of the defined types were placed in this  encounter.  Medication changes: No orders of the defined types were placed in this encounter.   Signed, Park Liter, MD, [facility] 07/07/2019 12:06 PM    [place]

## 2019-07-08 LAB — BASIC METABOLIC PANEL
BUN/Creatinine Ratio: 20 (ref 12–28)
BUN: 25 mg/dL (ref 8–27)
CO2: 25 mmol/L (ref 20–29)
Calcium: 9.7 mg/dL (ref 8.7–10.3)
Chloride: 105 mmol/L (ref 96–106)
Creatinine, Ser: 1.24 mg/dL — ABNORMAL HIGH (ref 0.57–1.00)
GFR calc Af Amer: 46 mL/min/{1.73_m2} — ABNORMAL LOW (ref >59)
GFR calc non Af Amer: 40 mL/min/{1.73_m2} — ABNORMAL LOW (ref >59)
Glucose: 104 mg/dL — ABNORMAL HIGH (ref 65–99)
Potassium: 3.8 mmol/L (ref 3.5–5.2)
Sodium: 144 mmol/L (ref 134–144)

## 2019-07-13 ENCOUNTER — Telehealth: Payer: Self-pay | Admitting: Emergency Medicine

## 2019-07-13 DIAGNOSIS — I10 Essential (primary) hypertension: Secondary | ICD-10-CM

## 2019-07-13 NOTE — Telephone Encounter (Signed)
Called patient and informed her of lab results. Advised her we need to repeat labs in 1 month. She verbally understood. No further questions.

## 2019-08-17 DIAGNOSIS — M79643 Pain in unspecified hand: Secondary | ICD-10-CM | POA: Diagnosis not present

## 2019-08-17 DIAGNOSIS — M25641 Stiffness of right hand, not elsewhere classified: Secondary | ICD-10-CM | POA: Diagnosis not present

## 2019-08-17 DIAGNOSIS — M65311 Trigger thumb, right thumb: Secondary | ICD-10-CM | POA: Diagnosis not present

## 2019-08-17 DIAGNOSIS — M6281 Muscle weakness (generalized): Secondary | ICD-10-CM | POA: Diagnosis not present

## 2019-08-19 DIAGNOSIS — M65311 Trigger thumb, right thumb: Secondary | ICD-10-CM | POA: Diagnosis not present

## 2019-08-19 DIAGNOSIS — M79643 Pain in unspecified hand: Secondary | ICD-10-CM | POA: Diagnosis not present

## 2019-08-19 DIAGNOSIS — M25641 Stiffness of right hand, not elsewhere classified: Secondary | ICD-10-CM | POA: Diagnosis not present

## 2019-08-19 DIAGNOSIS — M6281 Muscle weakness (generalized): Secondary | ICD-10-CM | POA: Diagnosis not present

## 2019-08-21 DIAGNOSIS — L570 Actinic keratosis: Secondary | ICD-10-CM | POA: Diagnosis not present

## 2019-08-21 DIAGNOSIS — L821 Other seborrheic keratosis: Secondary | ICD-10-CM | POA: Diagnosis not present

## 2019-08-24 DIAGNOSIS — M79643 Pain in unspecified hand: Secondary | ICD-10-CM | POA: Diagnosis not present

## 2019-08-24 DIAGNOSIS — M6281 Muscle weakness (generalized): Secondary | ICD-10-CM | POA: Diagnosis not present

## 2019-08-24 DIAGNOSIS — M25641 Stiffness of right hand, not elsewhere classified: Secondary | ICD-10-CM | POA: Diagnosis not present

## 2019-08-24 DIAGNOSIS — M65311 Trigger thumb, right thumb: Secondary | ICD-10-CM | POA: Diagnosis not present

## 2019-08-26 DIAGNOSIS — M65311 Trigger thumb, right thumb: Secondary | ICD-10-CM | POA: Diagnosis not present

## 2019-08-26 DIAGNOSIS — M25641 Stiffness of right hand, not elsewhere classified: Secondary | ICD-10-CM | POA: Diagnosis not present

## 2019-08-26 DIAGNOSIS — M79643 Pain in unspecified hand: Secondary | ICD-10-CM | POA: Diagnosis not present

## 2019-08-26 DIAGNOSIS — M6281 Muscle weakness (generalized): Secondary | ICD-10-CM | POA: Diagnosis not present

## 2019-08-27 DIAGNOSIS — I10 Essential (primary) hypertension: Secondary | ICD-10-CM | POA: Diagnosis not present

## 2019-08-31 DIAGNOSIS — M65311 Trigger thumb, right thumb: Secondary | ICD-10-CM | POA: Diagnosis not present

## 2019-08-31 DIAGNOSIS — M6281 Muscle weakness (generalized): Secondary | ICD-10-CM | POA: Diagnosis not present

## 2019-08-31 DIAGNOSIS — M79643 Pain in unspecified hand: Secondary | ICD-10-CM | POA: Diagnosis not present

## 2019-08-31 DIAGNOSIS — M25641 Stiffness of right hand, not elsewhere classified: Secondary | ICD-10-CM | POA: Diagnosis not present

## 2019-09-02 DIAGNOSIS — M79643 Pain in unspecified hand: Secondary | ICD-10-CM | POA: Diagnosis not present

## 2019-09-02 DIAGNOSIS — M25641 Stiffness of right hand, not elsewhere classified: Secondary | ICD-10-CM | POA: Diagnosis not present

## 2019-09-02 DIAGNOSIS — M6281 Muscle weakness (generalized): Secondary | ICD-10-CM | POA: Diagnosis not present

## 2019-09-02 DIAGNOSIS — M65311 Trigger thumb, right thumb: Secondary | ICD-10-CM | POA: Diagnosis not present

## 2019-09-11 DIAGNOSIS — M25641 Stiffness of right hand, not elsewhere classified: Secondary | ICD-10-CM | POA: Diagnosis not present

## 2019-09-11 DIAGNOSIS — M6281 Muscle weakness (generalized): Secondary | ICD-10-CM | POA: Diagnosis not present

## 2019-09-11 DIAGNOSIS — M65311 Trigger thumb, right thumb: Secondary | ICD-10-CM | POA: Diagnosis not present

## 2019-09-11 DIAGNOSIS — M79643 Pain in unspecified hand: Secondary | ICD-10-CM | POA: Diagnosis not present

## 2019-09-14 DIAGNOSIS — M6281 Muscle weakness (generalized): Secondary | ICD-10-CM | POA: Diagnosis not present

## 2019-09-14 DIAGNOSIS — M65311 Trigger thumb, right thumb: Secondary | ICD-10-CM | POA: Diagnosis not present

## 2019-09-14 DIAGNOSIS — M25641 Stiffness of right hand, not elsewhere classified: Secondary | ICD-10-CM | POA: Diagnosis not present

## 2019-09-14 DIAGNOSIS — M79643 Pain in unspecified hand: Secondary | ICD-10-CM | POA: Diagnosis not present

## 2019-09-16 DIAGNOSIS — M65311 Trigger thumb, right thumb: Secondary | ICD-10-CM | POA: Diagnosis not present

## 2019-09-16 DIAGNOSIS — M6281 Muscle weakness (generalized): Secondary | ICD-10-CM | POA: Diagnosis not present

## 2019-09-16 DIAGNOSIS — M79643 Pain in unspecified hand: Secondary | ICD-10-CM | POA: Diagnosis not present

## 2019-09-16 DIAGNOSIS — M25641 Stiffness of right hand, not elsewhere classified: Secondary | ICD-10-CM | POA: Diagnosis not present

## 2019-09-17 DIAGNOSIS — N390 Urinary tract infection, site not specified: Secondary | ICD-10-CM | POA: Diagnosis not present

## 2019-09-22 DIAGNOSIS — M65311 Trigger thumb, right thumb: Secondary | ICD-10-CM | POA: Diagnosis not present

## 2019-09-22 DIAGNOSIS — M6281 Muscle weakness (generalized): Secondary | ICD-10-CM | POA: Diagnosis not present

## 2019-09-22 DIAGNOSIS — M79643 Pain in unspecified hand: Secondary | ICD-10-CM | POA: Diagnosis not present

## 2019-09-22 DIAGNOSIS — M25641 Stiffness of right hand, not elsewhere classified: Secondary | ICD-10-CM | POA: Diagnosis not present

## 2019-09-25 DIAGNOSIS — M25641 Stiffness of right hand, not elsewhere classified: Secondary | ICD-10-CM | POA: Diagnosis not present

## 2019-09-25 DIAGNOSIS — M79643 Pain in unspecified hand: Secondary | ICD-10-CM | POA: Diagnosis not present

## 2019-09-25 DIAGNOSIS — M65311 Trigger thumb, right thumb: Secondary | ICD-10-CM | POA: Diagnosis not present

## 2019-09-25 DIAGNOSIS — M6281 Muscle weakness (generalized): Secondary | ICD-10-CM | POA: Diagnosis not present

## 2019-09-28 DIAGNOSIS — M25641 Stiffness of right hand, not elsewhere classified: Secondary | ICD-10-CM | POA: Diagnosis not present

## 2019-09-28 DIAGNOSIS — M79643 Pain in unspecified hand: Secondary | ICD-10-CM | POA: Diagnosis not present

## 2019-09-28 DIAGNOSIS — M65311 Trigger thumb, right thumb: Secondary | ICD-10-CM | POA: Diagnosis not present

## 2019-09-28 DIAGNOSIS — M6281 Muscle weakness (generalized): Secondary | ICD-10-CM | POA: Diagnosis not present

## 2019-09-30 ENCOUNTER — Other Ambulatory Visit: Payer: Self-pay

## 2019-09-30 ENCOUNTER — Ambulatory Visit (INDEPENDENT_AMBULATORY_CARE_PROVIDER_SITE_OTHER): Payer: Medicare PPO

## 2019-09-30 DIAGNOSIS — I10 Essential (primary) hypertension: Secondary | ICD-10-CM

## 2019-09-30 DIAGNOSIS — R0600 Dyspnea, unspecified: Secondary | ICD-10-CM

## 2019-09-30 DIAGNOSIS — I2510 Atherosclerotic heart disease of native coronary artery without angina pectoris: Secondary | ICD-10-CM

## 2019-09-30 NOTE — Progress Notes (Signed)
Complete echocardiogram has been performed.  Jimmy Charlet Harr RDCS, RVT 

## 2019-10-05 DIAGNOSIS — M6281 Muscle weakness (generalized): Secondary | ICD-10-CM | POA: Diagnosis not present

## 2019-10-05 DIAGNOSIS — M25641 Stiffness of right hand, not elsewhere classified: Secondary | ICD-10-CM | POA: Diagnosis not present

## 2019-10-05 DIAGNOSIS — M79643 Pain in unspecified hand: Secondary | ICD-10-CM | POA: Diagnosis not present

## 2019-10-05 DIAGNOSIS — M65311 Trigger thumb, right thumb: Secondary | ICD-10-CM | POA: Diagnosis not present

## 2019-10-07 DIAGNOSIS — M79643 Pain in unspecified hand: Secondary | ICD-10-CM | POA: Diagnosis not present

## 2019-10-07 DIAGNOSIS — M25641 Stiffness of right hand, not elsewhere classified: Secondary | ICD-10-CM | POA: Diagnosis not present

## 2019-10-07 DIAGNOSIS — M6281 Muscle weakness (generalized): Secondary | ICD-10-CM | POA: Diagnosis not present

## 2019-10-07 DIAGNOSIS — M65311 Trigger thumb, right thumb: Secondary | ICD-10-CM | POA: Diagnosis not present

## 2019-10-12 DIAGNOSIS — M25641 Stiffness of right hand, not elsewhere classified: Secondary | ICD-10-CM | POA: Diagnosis not present

## 2019-10-12 DIAGNOSIS — M79643 Pain in unspecified hand: Secondary | ICD-10-CM | POA: Diagnosis not present

## 2019-10-12 DIAGNOSIS — M6281 Muscle weakness (generalized): Secondary | ICD-10-CM | POA: Diagnosis not present

## 2019-10-12 DIAGNOSIS — M65311 Trigger thumb, right thumb: Secondary | ICD-10-CM | POA: Diagnosis not present

## 2019-10-14 DIAGNOSIS — M65311 Trigger thumb, right thumb: Secondary | ICD-10-CM | POA: Diagnosis not present

## 2019-10-14 DIAGNOSIS — M25641 Stiffness of right hand, not elsewhere classified: Secondary | ICD-10-CM | POA: Diagnosis not present

## 2019-10-14 DIAGNOSIS — M6281 Muscle weakness (generalized): Secondary | ICD-10-CM | POA: Diagnosis not present

## 2019-10-14 DIAGNOSIS — M79643 Pain in unspecified hand: Secondary | ICD-10-CM | POA: Diagnosis not present

## 2019-10-19 DIAGNOSIS — R197 Diarrhea, unspecified: Secondary | ICD-10-CM | POA: Diagnosis not present

## 2019-10-20 DIAGNOSIS — R197 Diarrhea, unspecified: Secondary | ICD-10-CM | POA: Diagnosis not present

## 2019-10-21 DIAGNOSIS — R197 Diarrhea, unspecified: Secondary | ICD-10-CM | POA: Diagnosis not present

## 2019-10-27 DIAGNOSIS — M79643 Pain in unspecified hand: Secondary | ICD-10-CM | POA: Diagnosis not present

## 2019-10-27 DIAGNOSIS — M65311 Trigger thumb, right thumb: Secondary | ICD-10-CM | POA: Diagnosis not present

## 2019-10-27 DIAGNOSIS — M25641 Stiffness of right hand, not elsewhere classified: Secondary | ICD-10-CM | POA: Diagnosis not present

## 2019-10-27 DIAGNOSIS — M6281 Muscle weakness (generalized): Secondary | ICD-10-CM | POA: Diagnosis not present

## 2019-10-29 DIAGNOSIS — M25641 Stiffness of right hand, not elsewhere classified: Secondary | ICD-10-CM | POA: Diagnosis not present

## 2019-10-29 DIAGNOSIS — M79643 Pain in unspecified hand: Secondary | ICD-10-CM | POA: Diagnosis not present

## 2019-10-29 DIAGNOSIS — M65311 Trigger thumb, right thumb: Secondary | ICD-10-CM | POA: Diagnosis not present

## 2019-10-29 DIAGNOSIS — M6281 Muscle weakness (generalized): Secondary | ICD-10-CM | POA: Diagnosis not present

## 2019-11-03 DIAGNOSIS — M25641 Stiffness of right hand, not elsewhere classified: Secondary | ICD-10-CM | POA: Diagnosis not present

## 2019-11-03 DIAGNOSIS — M79643 Pain in unspecified hand: Secondary | ICD-10-CM | POA: Diagnosis not present

## 2019-11-03 DIAGNOSIS — M65311 Trigger thumb, right thumb: Secondary | ICD-10-CM | POA: Diagnosis not present

## 2019-11-03 DIAGNOSIS — M6281 Muscle weakness (generalized): Secondary | ICD-10-CM | POA: Diagnosis not present

## 2019-11-05 DIAGNOSIS — M25641 Stiffness of right hand, not elsewhere classified: Secondary | ICD-10-CM | POA: Diagnosis not present

## 2019-11-05 DIAGNOSIS — M79643 Pain in unspecified hand: Secondary | ICD-10-CM | POA: Diagnosis not present

## 2019-11-05 DIAGNOSIS — M6281 Muscle weakness (generalized): Secondary | ICD-10-CM | POA: Diagnosis not present

## 2019-11-05 DIAGNOSIS — M65311 Trigger thumb, right thumb: Secondary | ICD-10-CM | POA: Diagnosis not present

## 2019-11-10 DIAGNOSIS — M79643 Pain in unspecified hand: Secondary | ICD-10-CM | POA: Diagnosis not present

## 2019-11-10 DIAGNOSIS — M65311 Trigger thumb, right thumb: Secondary | ICD-10-CM | POA: Diagnosis not present

## 2019-11-10 DIAGNOSIS — M6281 Muscle weakness (generalized): Secondary | ICD-10-CM | POA: Diagnosis not present

## 2019-11-10 DIAGNOSIS — M25641 Stiffness of right hand, not elsewhere classified: Secondary | ICD-10-CM | POA: Diagnosis not present

## 2019-11-12 DIAGNOSIS — M79643 Pain in unspecified hand: Secondary | ICD-10-CM | POA: Diagnosis not present

## 2019-11-12 DIAGNOSIS — M6281 Muscle weakness (generalized): Secondary | ICD-10-CM | POA: Diagnosis not present

## 2019-11-12 DIAGNOSIS — M65311 Trigger thumb, right thumb: Secondary | ICD-10-CM | POA: Diagnosis not present

## 2019-11-12 DIAGNOSIS — M25641 Stiffness of right hand, not elsewhere classified: Secondary | ICD-10-CM | POA: Diagnosis not present

## 2019-11-17 DIAGNOSIS — M6281 Muscle weakness (generalized): Secondary | ICD-10-CM | POA: Diagnosis not present

## 2019-11-17 DIAGNOSIS — M79643 Pain in unspecified hand: Secondary | ICD-10-CM | POA: Diagnosis not present

## 2019-11-17 DIAGNOSIS — M25641 Stiffness of right hand, not elsewhere classified: Secondary | ICD-10-CM | POA: Diagnosis not present

## 2019-11-17 DIAGNOSIS — M65311 Trigger thumb, right thumb: Secondary | ICD-10-CM | POA: Diagnosis not present

## 2019-11-19 DIAGNOSIS — M6281 Muscle weakness (generalized): Secondary | ICD-10-CM | POA: Diagnosis not present

## 2019-11-19 DIAGNOSIS — M25641 Stiffness of right hand, not elsewhere classified: Secondary | ICD-10-CM | POA: Diagnosis not present

## 2019-11-19 DIAGNOSIS — M65311 Trigger thumb, right thumb: Secondary | ICD-10-CM | POA: Diagnosis not present

## 2019-11-19 DIAGNOSIS — M79643 Pain in unspecified hand: Secondary | ICD-10-CM | POA: Diagnosis not present

## 2019-11-20 DIAGNOSIS — M65312 Trigger thumb, left thumb: Secondary | ICD-10-CM | POA: Diagnosis not present

## 2019-11-27 DIAGNOSIS — M25551 Pain in right hip: Secondary | ICD-10-CM | POA: Diagnosis not present

## 2019-12-08 DIAGNOSIS — M7062 Trochanteric bursitis, left hip: Secondary | ICD-10-CM | POA: Diagnosis not present

## 2019-12-08 DIAGNOSIS — M7052 Other bursitis of knee, left knee: Secondary | ICD-10-CM | POA: Diagnosis not present

## 2019-12-08 DIAGNOSIS — Z9889 Other specified postprocedural states: Secondary | ICD-10-CM | POA: Diagnosis not present

## 2019-12-11 DIAGNOSIS — H6121 Impacted cerumen, right ear: Secondary | ICD-10-CM | POA: Diagnosis not present

## 2019-12-14 DIAGNOSIS — M6281 Muscle weakness (generalized): Secondary | ICD-10-CM | POA: Diagnosis not present

## 2019-12-14 DIAGNOSIS — M25641 Stiffness of right hand, not elsewhere classified: Secondary | ICD-10-CM | POA: Diagnosis not present

## 2019-12-14 DIAGNOSIS — M65311 Trigger thumb, right thumb: Secondary | ICD-10-CM | POA: Diagnosis not present

## 2019-12-14 DIAGNOSIS — M79643 Pain in unspecified hand: Secondary | ICD-10-CM | POA: Diagnosis not present

## 2019-12-17 DIAGNOSIS — M6281 Muscle weakness (generalized): Secondary | ICD-10-CM | POA: Diagnosis not present

## 2019-12-17 DIAGNOSIS — M79643 Pain in unspecified hand: Secondary | ICD-10-CM | POA: Diagnosis not present

## 2019-12-17 DIAGNOSIS — M25641 Stiffness of right hand, not elsewhere classified: Secondary | ICD-10-CM | POA: Diagnosis not present

## 2019-12-17 DIAGNOSIS — M65311 Trigger thumb, right thumb: Secondary | ICD-10-CM | POA: Diagnosis not present

## 2019-12-21 DIAGNOSIS — M6281 Muscle weakness (generalized): Secondary | ICD-10-CM | POA: Diagnosis not present

## 2019-12-21 DIAGNOSIS — M25641 Stiffness of right hand, not elsewhere classified: Secondary | ICD-10-CM | POA: Diagnosis not present

## 2019-12-21 DIAGNOSIS — M65311 Trigger thumb, right thumb: Secondary | ICD-10-CM | POA: Diagnosis not present

## 2019-12-21 DIAGNOSIS — M79643 Pain in unspecified hand: Secondary | ICD-10-CM | POA: Diagnosis not present

## 2019-12-22 DIAGNOSIS — S41112A Laceration without foreign body of left upper arm, initial encounter: Secondary | ICD-10-CM | POA: Diagnosis not present

## 2019-12-29 DIAGNOSIS — M7051 Other bursitis of knee, right knee: Secondary | ICD-10-CM | POA: Diagnosis not present

## 2019-12-29 DIAGNOSIS — M7061 Trochanteric bursitis, right hip: Secondary | ICD-10-CM | POA: Diagnosis not present

## 2019-12-29 HISTORY — DX: Other bursitis of knee, right knee: M70.51

## 2019-12-29 HISTORY — DX: Trochanteric bursitis, right hip: M70.61

## 2019-12-31 DIAGNOSIS — H7291 Unspecified perforation of tympanic membrane, right ear: Secondary | ICD-10-CM | POA: Diagnosis not present

## 2019-12-31 DIAGNOSIS — H9190 Unspecified hearing loss, unspecified ear: Secondary | ICD-10-CM | POA: Diagnosis not present

## 2019-12-31 DIAGNOSIS — H61303 Acquired stenosis of external ear canal, unspecified, bilateral: Secondary | ICD-10-CM | POA: Diagnosis not present

## 2019-12-31 DIAGNOSIS — H6121 Impacted cerumen, right ear: Secondary | ICD-10-CM | POA: Diagnosis not present

## 2020-01-04 DIAGNOSIS — M79643 Pain in unspecified hand: Secondary | ICD-10-CM | POA: Diagnosis not present

## 2020-01-04 DIAGNOSIS — M6281 Muscle weakness (generalized): Secondary | ICD-10-CM | POA: Diagnosis not present

## 2020-01-04 DIAGNOSIS — M25641 Stiffness of right hand, not elsewhere classified: Secondary | ICD-10-CM | POA: Diagnosis not present

## 2020-01-04 DIAGNOSIS — M65311 Trigger thumb, right thumb: Secondary | ICD-10-CM | POA: Diagnosis not present

## 2020-01-08 DIAGNOSIS — M79643 Pain in unspecified hand: Secondary | ICD-10-CM | POA: Diagnosis not present

## 2020-01-08 DIAGNOSIS — M65311 Trigger thumb, right thumb: Secondary | ICD-10-CM | POA: Diagnosis not present

## 2020-01-08 DIAGNOSIS — M25641 Stiffness of right hand, not elsewhere classified: Secondary | ICD-10-CM | POA: Diagnosis not present

## 2020-01-08 DIAGNOSIS — M6281 Muscle weakness (generalized): Secondary | ICD-10-CM | POA: Diagnosis not present

## 2020-01-12 DIAGNOSIS — M65311 Trigger thumb, right thumb: Secondary | ICD-10-CM | POA: Diagnosis not present

## 2020-01-12 DIAGNOSIS — M6281 Muscle weakness (generalized): Secondary | ICD-10-CM | POA: Diagnosis not present

## 2020-01-12 DIAGNOSIS — M25641 Stiffness of right hand, not elsewhere classified: Secondary | ICD-10-CM | POA: Diagnosis not present

## 2020-01-12 DIAGNOSIS — M79643 Pain in unspecified hand: Secondary | ICD-10-CM | POA: Diagnosis not present

## 2020-01-14 DIAGNOSIS — M6281 Muscle weakness (generalized): Secondary | ICD-10-CM | POA: Diagnosis not present

## 2020-01-14 DIAGNOSIS — M65311 Trigger thumb, right thumb: Secondary | ICD-10-CM | POA: Diagnosis not present

## 2020-01-14 DIAGNOSIS — M79643 Pain in unspecified hand: Secondary | ICD-10-CM | POA: Diagnosis not present

## 2020-01-14 DIAGNOSIS — M25641 Stiffness of right hand, not elsewhere classified: Secondary | ICD-10-CM | POA: Diagnosis not present

## 2020-03-03 ENCOUNTER — Telehealth: Payer: Self-pay

## 2020-03-03 NOTE — Telephone Encounter (Signed)
Colleen Watkins is going to have upcoming surgery and she needs recommendations for her plavix.  Dr. Tobie Poet advised that she stop the medication 5 days prior to the procedure.  The patient's daughter is aware of recommendations.

## 2020-03-08 DIAGNOSIS — N390 Urinary tract infection, site not specified: Secondary | ICD-10-CM | POA: Diagnosis not present

## 2020-03-10 DIAGNOSIS — M25551 Pain in right hip: Secondary | ICD-10-CM | POA: Diagnosis not present

## 2020-03-10 DIAGNOSIS — M7061 Trochanteric bursitis, right hip: Secondary | ICD-10-CM | POA: Diagnosis not present

## 2020-03-10 DIAGNOSIS — S79911A Unspecified injury of right hip, initial encounter: Secondary | ICD-10-CM | POA: Diagnosis not present

## 2020-03-14 DIAGNOSIS — R262 Difficulty in walking, not elsewhere classified: Secondary | ICD-10-CM | POA: Diagnosis not present

## 2020-03-14 DIAGNOSIS — M25651 Stiffness of right hip, not elsewhere classified: Secondary | ICD-10-CM | POA: Diagnosis not present

## 2020-03-14 DIAGNOSIS — M25551 Pain in right hip: Secondary | ICD-10-CM | POA: Diagnosis not present

## 2020-03-14 DIAGNOSIS — M7071 Other bursitis of hip, right hip: Secondary | ICD-10-CM | POA: Diagnosis not present

## 2020-03-23 DIAGNOSIS — M25551 Pain in right hip: Secondary | ICD-10-CM | POA: Diagnosis not present

## 2020-03-23 DIAGNOSIS — R350 Frequency of micturition: Secondary | ICD-10-CM | POA: Diagnosis not present

## 2020-03-23 DIAGNOSIS — M7071 Other bursitis of hip, right hip: Secondary | ICD-10-CM | POA: Diagnosis not present

## 2020-03-23 DIAGNOSIS — N390 Urinary tract infection, site not specified: Secondary | ICD-10-CM | POA: Diagnosis not present

## 2020-03-23 DIAGNOSIS — R600 Localized edema: Secondary | ICD-10-CM | POA: Diagnosis not present

## 2020-03-23 DIAGNOSIS — R262 Difficulty in walking, not elsewhere classified: Secondary | ICD-10-CM | POA: Diagnosis not present

## 2020-03-23 DIAGNOSIS — M25651 Stiffness of right hip, not elsewhere classified: Secondary | ICD-10-CM | POA: Diagnosis not present

## 2020-03-24 DIAGNOSIS — M7071 Other bursitis of hip, right hip: Secondary | ICD-10-CM | POA: Diagnosis not present

## 2020-03-24 DIAGNOSIS — M25551 Pain in right hip: Secondary | ICD-10-CM | POA: Diagnosis not present

## 2020-03-24 DIAGNOSIS — M25651 Stiffness of right hip, not elsewhere classified: Secondary | ICD-10-CM | POA: Diagnosis not present

## 2020-03-24 DIAGNOSIS — R262 Difficulty in walking, not elsewhere classified: Secondary | ICD-10-CM | POA: Diagnosis not present

## 2020-03-28 DIAGNOSIS — M545 Low back pain: Secondary | ICD-10-CM | POA: Diagnosis not present

## 2020-03-28 DIAGNOSIS — M4726 Other spondylosis with radiculopathy, lumbar region: Secondary | ICD-10-CM | POA: Diagnosis not present

## 2020-03-28 DIAGNOSIS — M419 Scoliosis, unspecified: Secondary | ICD-10-CM | POA: Diagnosis not present

## 2020-03-28 DIAGNOSIS — M5416 Radiculopathy, lumbar region: Secondary | ICD-10-CM | POA: Diagnosis not present

## 2020-03-28 DIAGNOSIS — I700 Atherosclerosis of aorta: Secondary | ICD-10-CM | POA: Diagnosis not present

## 2020-03-28 DIAGNOSIS — I872 Venous insufficiency (chronic) (peripheral): Secondary | ICD-10-CM | POA: Diagnosis not present

## 2020-03-30 DIAGNOSIS — M2560 Stiffness of unspecified joint, not elsewhere classified: Secondary | ICD-10-CM | POA: Diagnosis not present

## 2020-03-30 DIAGNOSIS — R262 Difficulty in walking, not elsewhere classified: Secondary | ICD-10-CM | POA: Diagnosis not present

## 2020-03-30 DIAGNOSIS — M5416 Radiculopathy, lumbar region: Secondary | ICD-10-CM | POA: Diagnosis not present

## 2020-03-30 DIAGNOSIS — M7071 Other bursitis of hip, right hip: Secondary | ICD-10-CM | POA: Diagnosis not present

## 2020-03-30 DIAGNOSIS — R531 Weakness: Secondary | ICD-10-CM | POA: Diagnosis not present

## 2020-03-30 DIAGNOSIS — M545 Low back pain: Secondary | ICD-10-CM | POA: Diagnosis not present

## 2020-03-30 DIAGNOSIS — M25551 Pain in right hip: Secondary | ICD-10-CM | POA: Diagnosis not present

## 2020-03-31 DIAGNOSIS — R531 Weakness: Secondary | ICD-10-CM | POA: Diagnosis not present

## 2020-03-31 DIAGNOSIS — M7071 Other bursitis of hip, right hip: Secondary | ICD-10-CM | POA: Diagnosis not present

## 2020-03-31 DIAGNOSIS — M5416 Radiculopathy, lumbar region: Secondary | ICD-10-CM | POA: Diagnosis not present

## 2020-03-31 DIAGNOSIS — M2560 Stiffness of unspecified joint, not elsewhere classified: Secondary | ICD-10-CM | POA: Diagnosis not present

## 2020-03-31 DIAGNOSIS — M545 Low back pain: Secondary | ICD-10-CM | POA: Diagnosis not present

## 2020-03-31 DIAGNOSIS — M25551 Pain in right hip: Secondary | ICD-10-CM | POA: Diagnosis not present

## 2020-03-31 DIAGNOSIS — R262 Difficulty in walking, not elsewhere classified: Secondary | ICD-10-CM | POA: Diagnosis not present

## 2020-04-05 DIAGNOSIS — L821 Other seborrheic keratosis: Secondary | ICD-10-CM | POA: Diagnosis not present

## 2020-04-05 DIAGNOSIS — L578 Other skin changes due to chronic exposure to nonionizing radiation: Secondary | ICD-10-CM | POA: Diagnosis not present

## 2020-04-05 DIAGNOSIS — L219 Seborrheic dermatitis, unspecified: Secondary | ICD-10-CM | POA: Diagnosis not present

## 2020-04-05 DIAGNOSIS — L570 Actinic keratosis: Secondary | ICD-10-CM | POA: Diagnosis not present

## 2020-04-06 DIAGNOSIS — M7071 Other bursitis of hip, right hip: Secondary | ICD-10-CM | POA: Diagnosis not present

## 2020-04-06 DIAGNOSIS — M545 Low back pain: Secondary | ICD-10-CM | POA: Diagnosis not present

## 2020-04-06 DIAGNOSIS — M5416 Radiculopathy, lumbar region: Secondary | ICD-10-CM | POA: Diagnosis not present

## 2020-04-06 DIAGNOSIS — R531 Weakness: Secondary | ICD-10-CM | POA: Diagnosis not present

## 2020-04-06 DIAGNOSIS — R262 Difficulty in walking, not elsewhere classified: Secondary | ICD-10-CM | POA: Diagnosis not present

## 2020-04-06 DIAGNOSIS — M2560 Stiffness of unspecified joint, not elsewhere classified: Secondary | ICD-10-CM | POA: Diagnosis not present

## 2020-04-06 DIAGNOSIS — M25551 Pain in right hip: Secondary | ICD-10-CM | POA: Diagnosis not present

## 2020-04-08 DIAGNOSIS — R262 Difficulty in walking, not elsewhere classified: Secondary | ICD-10-CM | POA: Diagnosis not present

## 2020-04-08 DIAGNOSIS — M5416 Radiculopathy, lumbar region: Secondary | ICD-10-CM | POA: Diagnosis not present

## 2020-04-08 DIAGNOSIS — M7071 Other bursitis of hip, right hip: Secondary | ICD-10-CM | POA: Diagnosis not present

## 2020-04-08 DIAGNOSIS — R531 Weakness: Secondary | ICD-10-CM | POA: Diagnosis not present

## 2020-04-08 DIAGNOSIS — M25551 Pain in right hip: Secondary | ICD-10-CM | POA: Diagnosis not present

## 2020-04-08 DIAGNOSIS — M545 Low back pain: Secondary | ICD-10-CM | POA: Diagnosis not present

## 2020-04-08 DIAGNOSIS — M2560 Stiffness of unspecified joint, not elsewhere classified: Secondary | ICD-10-CM | POA: Diagnosis not present

## 2020-04-11 DIAGNOSIS — M5416 Radiculopathy, lumbar region: Secondary | ICD-10-CM | POA: Diagnosis not present

## 2020-04-11 DIAGNOSIS — M545 Low back pain: Secondary | ICD-10-CM | POA: Diagnosis not present

## 2020-04-11 DIAGNOSIS — R531 Weakness: Secondary | ICD-10-CM | POA: Diagnosis not present

## 2020-04-11 DIAGNOSIS — M7071 Other bursitis of hip, right hip: Secondary | ICD-10-CM | POA: Diagnosis not present

## 2020-04-11 DIAGNOSIS — M25551 Pain in right hip: Secondary | ICD-10-CM | POA: Diagnosis not present

## 2020-04-11 DIAGNOSIS — R262 Difficulty in walking, not elsewhere classified: Secondary | ICD-10-CM | POA: Diagnosis not present

## 2020-04-11 DIAGNOSIS — M2560 Stiffness of unspecified joint, not elsewhere classified: Secondary | ICD-10-CM | POA: Diagnosis not present

## 2020-04-12 DIAGNOSIS — M25562 Pain in left knee: Secondary | ICD-10-CM | POA: Diagnosis not present

## 2020-04-12 DIAGNOSIS — G8929 Other chronic pain: Secondary | ICD-10-CM | POA: Diagnosis not present

## 2020-04-15 DIAGNOSIS — M7071 Other bursitis of hip, right hip: Secondary | ICD-10-CM | POA: Diagnosis not present

## 2020-04-15 DIAGNOSIS — M2560 Stiffness of unspecified joint, not elsewhere classified: Secondary | ICD-10-CM | POA: Diagnosis not present

## 2020-04-15 DIAGNOSIS — R531 Weakness: Secondary | ICD-10-CM | POA: Diagnosis not present

## 2020-04-15 DIAGNOSIS — M5416 Radiculopathy, lumbar region: Secondary | ICD-10-CM | POA: Diagnosis not present

## 2020-04-15 DIAGNOSIS — M25551 Pain in right hip: Secondary | ICD-10-CM | POA: Diagnosis not present

## 2020-04-15 DIAGNOSIS — M545 Low back pain: Secondary | ICD-10-CM | POA: Diagnosis not present

## 2020-04-15 DIAGNOSIS — R262 Difficulty in walking, not elsewhere classified: Secondary | ICD-10-CM | POA: Diagnosis not present

## 2020-04-20 DIAGNOSIS — M545 Low back pain: Secondary | ICD-10-CM | POA: Diagnosis not present

## 2020-04-20 DIAGNOSIS — M25551 Pain in right hip: Secondary | ICD-10-CM | POA: Diagnosis not present

## 2020-04-20 DIAGNOSIS — R531 Weakness: Secondary | ICD-10-CM | POA: Diagnosis not present

## 2020-04-20 DIAGNOSIS — M5416 Radiculopathy, lumbar region: Secondary | ICD-10-CM | POA: Diagnosis not present

## 2020-04-20 DIAGNOSIS — M2560 Stiffness of unspecified joint, not elsewhere classified: Secondary | ICD-10-CM | POA: Diagnosis not present

## 2020-04-20 DIAGNOSIS — R262 Difficulty in walking, not elsewhere classified: Secondary | ICD-10-CM | POA: Diagnosis not present

## 2020-04-20 DIAGNOSIS — M7071 Other bursitis of hip, right hip: Secondary | ICD-10-CM | POA: Diagnosis not present

## 2020-04-25 DIAGNOSIS — M7071 Other bursitis of hip, right hip: Secondary | ICD-10-CM | POA: Diagnosis not present

## 2020-04-25 DIAGNOSIS — M545 Low back pain: Secondary | ICD-10-CM | POA: Diagnosis not present

## 2020-04-25 DIAGNOSIS — M5416 Radiculopathy, lumbar region: Secondary | ICD-10-CM | POA: Diagnosis not present

## 2020-04-25 DIAGNOSIS — R262 Difficulty in walking, not elsewhere classified: Secondary | ICD-10-CM | POA: Diagnosis not present

## 2020-04-25 DIAGNOSIS — M25551 Pain in right hip: Secondary | ICD-10-CM | POA: Diagnosis not present

## 2020-04-25 DIAGNOSIS — M2560 Stiffness of unspecified joint, not elsewhere classified: Secondary | ICD-10-CM | POA: Diagnosis not present

## 2020-04-25 DIAGNOSIS — R531 Weakness: Secondary | ICD-10-CM | POA: Diagnosis not present

## 2020-05-02 DIAGNOSIS — M545 Low back pain: Secondary | ICD-10-CM | POA: Diagnosis not present

## 2020-05-02 DIAGNOSIS — R262 Difficulty in walking, not elsewhere classified: Secondary | ICD-10-CM | POA: Diagnosis not present

## 2020-05-02 DIAGNOSIS — M2560 Stiffness of unspecified joint, not elsewhere classified: Secondary | ICD-10-CM | POA: Diagnosis not present

## 2020-05-02 DIAGNOSIS — M7071 Other bursitis of hip, right hip: Secondary | ICD-10-CM | POA: Diagnosis not present

## 2020-05-02 DIAGNOSIS — M25551 Pain in right hip: Secondary | ICD-10-CM | POA: Diagnosis not present

## 2020-05-02 DIAGNOSIS — M5416 Radiculopathy, lumbar region: Secondary | ICD-10-CM | POA: Diagnosis not present

## 2020-05-02 DIAGNOSIS — R531 Weakness: Secondary | ICD-10-CM | POA: Diagnosis not present

## 2020-05-09 DIAGNOSIS — M545 Low back pain: Secondary | ICD-10-CM | POA: Diagnosis not present

## 2020-05-09 DIAGNOSIS — R262 Difficulty in walking, not elsewhere classified: Secondary | ICD-10-CM | POA: Diagnosis not present

## 2020-05-09 DIAGNOSIS — M2560 Stiffness of unspecified joint, not elsewhere classified: Secondary | ICD-10-CM | POA: Diagnosis not present

## 2020-05-09 DIAGNOSIS — M7071 Other bursitis of hip, right hip: Secondary | ICD-10-CM | POA: Diagnosis not present

## 2020-05-09 DIAGNOSIS — R531 Weakness: Secondary | ICD-10-CM | POA: Diagnosis not present

## 2020-05-09 DIAGNOSIS — M25551 Pain in right hip: Secondary | ICD-10-CM | POA: Diagnosis not present

## 2020-05-09 DIAGNOSIS — M5416 Radiculopathy, lumbar region: Secondary | ICD-10-CM | POA: Diagnosis not present

## 2020-05-11 DIAGNOSIS — M545 Low back pain: Secondary | ICD-10-CM | POA: Diagnosis not present

## 2020-05-11 DIAGNOSIS — M25551 Pain in right hip: Secondary | ICD-10-CM | POA: Diagnosis not present

## 2020-05-11 DIAGNOSIS — R531 Weakness: Secondary | ICD-10-CM | POA: Diagnosis not present

## 2020-05-11 DIAGNOSIS — M5416 Radiculopathy, lumbar region: Secondary | ICD-10-CM | POA: Diagnosis not present

## 2020-05-11 DIAGNOSIS — M2560 Stiffness of unspecified joint, not elsewhere classified: Secondary | ICD-10-CM | POA: Diagnosis not present

## 2020-05-11 DIAGNOSIS — R262 Difficulty in walking, not elsewhere classified: Secondary | ICD-10-CM | POA: Diagnosis not present

## 2020-05-11 DIAGNOSIS — M7071 Other bursitis of hip, right hip: Secondary | ICD-10-CM | POA: Diagnosis not present

## 2020-05-16 DIAGNOSIS — M545 Low back pain, unspecified: Secondary | ICD-10-CM | POA: Diagnosis not present

## 2020-05-16 DIAGNOSIS — R262 Difficulty in walking, not elsewhere classified: Secondary | ICD-10-CM | POA: Diagnosis not present

## 2020-05-16 DIAGNOSIS — M2560 Stiffness of unspecified joint, not elsewhere classified: Secondary | ICD-10-CM | POA: Diagnosis not present

## 2020-05-16 DIAGNOSIS — R531 Weakness: Secondary | ICD-10-CM | POA: Diagnosis not present

## 2020-05-16 DIAGNOSIS — M25551 Pain in right hip: Secondary | ICD-10-CM | POA: Diagnosis not present

## 2020-05-16 DIAGNOSIS — M7071 Other bursitis of hip, right hip: Secondary | ICD-10-CM | POA: Diagnosis not present

## 2020-05-16 DIAGNOSIS — M5416 Radiculopathy, lumbar region: Secondary | ICD-10-CM | POA: Diagnosis not present

## 2020-05-18 DIAGNOSIS — M25551 Pain in right hip: Secondary | ICD-10-CM | POA: Diagnosis not present

## 2020-05-18 DIAGNOSIS — R262 Difficulty in walking, not elsewhere classified: Secondary | ICD-10-CM | POA: Diagnosis not present

## 2020-05-18 DIAGNOSIS — M7071 Other bursitis of hip, right hip: Secondary | ICD-10-CM | POA: Diagnosis not present

## 2020-05-18 DIAGNOSIS — R531 Weakness: Secondary | ICD-10-CM | POA: Diagnosis not present

## 2020-05-18 DIAGNOSIS — M5416 Radiculopathy, lumbar region: Secondary | ICD-10-CM | POA: Diagnosis not present

## 2020-05-18 DIAGNOSIS — M2560 Stiffness of unspecified joint, not elsewhere classified: Secondary | ICD-10-CM | POA: Diagnosis not present

## 2020-05-18 DIAGNOSIS — M545 Low back pain, unspecified: Secondary | ICD-10-CM | POA: Diagnosis not present

## 2020-06-06 DIAGNOSIS — R6889 Other general symptoms and signs: Secondary | ICD-10-CM | POA: Diagnosis not present

## 2020-06-13 DIAGNOSIS — R350 Frequency of micturition: Secondary | ICD-10-CM | POA: Diagnosis not present

## 2020-06-13 DIAGNOSIS — R600 Localized edema: Secondary | ICD-10-CM | POA: Diagnosis not present

## 2020-06-13 DIAGNOSIS — N390 Urinary tract infection, site not specified: Secondary | ICD-10-CM | POA: Diagnosis not present

## 2020-06-17 DIAGNOSIS — W540XXA Bitten by dog, initial encounter: Secondary | ICD-10-CM | POA: Diagnosis not present

## 2020-06-17 DIAGNOSIS — S61551A Open bite of right wrist, initial encounter: Secondary | ICD-10-CM | POA: Diagnosis not present

## 2020-06-30 DIAGNOSIS — R5381 Other malaise: Secondary | ICD-10-CM | POA: Diagnosis not present

## 2020-06-30 DIAGNOSIS — I2510 Atherosclerotic heart disease of native coronary artery without angina pectoris: Secondary | ICD-10-CM | POA: Diagnosis not present

## 2020-06-30 DIAGNOSIS — M25571 Pain in right ankle and joints of right foot: Secondary | ICD-10-CM | POA: Diagnosis not present

## 2020-06-30 DIAGNOSIS — M5136 Other intervertebral disc degeneration, lumbar region: Secondary | ICD-10-CM | POA: Diagnosis not present

## 2020-06-30 DIAGNOSIS — D649 Anemia, unspecified: Secondary | ICD-10-CM | POA: Diagnosis not present

## 2020-06-30 DIAGNOSIS — M48061 Spinal stenosis, lumbar region without neurogenic claudication: Secondary | ICD-10-CM | POA: Diagnosis not present

## 2020-06-30 DIAGNOSIS — I10 Essential (primary) hypertension: Secondary | ICD-10-CM | POA: Diagnosis not present

## 2020-06-30 DIAGNOSIS — K219 Gastro-esophageal reflux disease without esophagitis: Secondary | ICD-10-CM | POA: Diagnosis not present

## 2020-06-30 DIAGNOSIS — M7989 Other specified soft tissue disorders: Secondary | ICD-10-CM | POA: Diagnosis not present

## 2020-06-30 DIAGNOSIS — R5383 Other fatigue: Secondary | ICD-10-CM | POA: Diagnosis not present

## 2020-06-30 DIAGNOSIS — G4733 Obstructive sleep apnea (adult) (pediatric): Secondary | ICD-10-CM | POA: Diagnosis not present

## 2020-06-30 DIAGNOSIS — M25562 Pain in left knee: Secondary | ICD-10-CM | POA: Diagnosis not present

## 2020-06-30 HISTORY — DX: Obstructive sleep apnea (adult) (pediatric): G47.33

## 2020-06-30 HISTORY — DX: Other malaise: R53.81

## 2020-06-30 HISTORY — DX: Other fatigue: R53.83

## 2020-07-01 DIAGNOSIS — D649 Anemia, unspecified: Secondary | ICD-10-CM | POA: Insufficient documentation

## 2020-07-01 HISTORY — DX: Anemia, unspecified: D64.9

## 2020-07-11 DIAGNOSIS — E538 Deficiency of other specified B group vitamins: Secondary | ICD-10-CM | POA: Diagnosis not present

## 2020-07-20 DIAGNOSIS — M25562 Pain in left knee: Secondary | ICD-10-CM | POA: Diagnosis not present

## 2020-07-20 DIAGNOSIS — M4003 Postural kyphosis, cervicothoracic region: Secondary | ICD-10-CM | POA: Diagnosis not present

## 2020-07-20 DIAGNOSIS — M5136 Other intervertebral disc degeneration, lumbar region: Secondary | ICD-10-CM | POA: Diagnosis not present

## 2020-07-20 DIAGNOSIS — M5459 Other low back pain: Secondary | ICD-10-CM | POA: Diagnosis not present

## 2020-07-20 DIAGNOSIS — M62552 Muscle wasting and atrophy, not elsewhere classified, left thigh: Secondary | ICD-10-CM | POA: Diagnosis not present

## 2020-07-20 DIAGNOSIS — R2689 Other abnormalities of gait and mobility: Secondary | ICD-10-CM | POA: Diagnosis not present

## 2020-07-20 DIAGNOSIS — M4005 Postural kyphosis, thoracolumbar region: Secondary | ICD-10-CM | POA: Diagnosis not present

## 2020-07-20 DIAGNOSIS — M79662 Pain in left lower leg: Secondary | ICD-10-CM | POA: Diagnosis not present

## 2020-07-21 DIAGNOSIS — M1712 Unilateral primary osteoarthritis, left knee: Secondary | ICD-10-CM | POA: Diagnosis not present

## 2020-07-25 DIAGNOSIS — M25562 Pain in left knee: Secondary | ICD-10-CM | POA: Diagnosis not present

## 2020-07-25 DIAGNOSIS — M5136 Other intervertebral disc degeneration, lumbar region: Secondary | ICD-10-CM | POA: Diagnosis not present

## 2020-07-25 DIAGNOSIS — M79662 Pain in left lower leg: Secondary | ICD-10-CM | POA: Diagnosis not present

## 2020-07-25 DIAGNOSIS — M5459 Other low back pain: Secondary | ICD-10-CM | POA: Diagnosis not present

## 2020-07-25 DIAGNOSIS — M4003 Postural kyphosis, cervicothoracic region: Secondary | ICD-10-CM | POA: Diagnosis not present

## 2020-07-25 DIAGNOSIS — M4005 Postural kyphosis, thoracolumbar region: Secondary | ICD-10-CM | POA: Diagnosis not present

## 2020-07-25 DIAGNOSIS — R2689 Other abnormalities of gait and mobility: Secondary | ICD-10-CM | POA: Diagnosis not present

## 2020-07-25 DIAGNOSIS — M62552 Muscle wasting and atrophy, not elsewhere classified, left thigh: Secondary | ICD-10-CM | POA: Diagnosis not present

## 2020-07-27 DIAGNOSIS — M62552 Muscle wasting and atrophy, not elsewhere classified, left thigh: Secondary | ICD-10-CM | POA: Diagnosis not present

## 2020-07-27 DIAGNOSIS — M5459 Other low back pain: Secondary | ICD-10-CM | POA: Diagnosis not present

## 2020-07-27 DIAGNOSIS — M79662 Pain in left lower leg: Secondary | ICD-10-CM | POA: Diagnosis not present

## 2020-07-27 DIAGNOSIS — M25562 Pain in left knee: Secondary | ICD-10-CM | POA: Diagnosis not present

## 2020-07-27 DIAGNOSIS — R2689 Other abnormalities of gait and mobility: Secondary | ICD-10-CM | POA: Diagnosis not present

## 2020-07-27 DIAGNOSIS — M5136 Other intervertebral disc degeneration, lumbar region: Secondary | ICD-10-CM | POA: Diagnosis not present

## 2020-07-27 DIAGNOSIS — M4003 Postural kyphosis, cervicothoracic region: Secondary | ICD-10-CM | POA: Diagnosis not present

## 2020-07-27 DIAGNOSIS — M4005 Postural kyphosis, thoracolumbar region: Secondary | ICD-10-CM | POA: Diagnosis not present

## 2020-08-02 DIAGNOSIS — M4005 Postural kyphosis, thoracolumbar region: Secondary | ICD-10-CM | POA: Diagnosis not present

## 2020-08-02 DIAGNOSIS — M25562 Pain in left knee: Secondary | ICD-10-CM | POA: Diagnosis not present

## 2020-08-02 DIAGNOSIS — R2689 Other abnormalities of gait and mobility: Secondary | ICD-10-CM | POA: Diagnosis not present

## 2020-08-02 DIAGNOSIS — M4003 Postural kyphosis, cervicothoracic region: Secondary | ICD-10-CM | POA: Diagnosis not present

## 2020-08-02 DIAGNOSIS — M62552 Muscle wasting and atrophy, not elsewhere classified, left thigh: Secondary | ICD-10-CM | POA: Diagnosis not present

## 2020-08-02 DIAGNOSIS — M5459 Other low back pain: Secondary | ICD-10-CM | POA: Diagnosis not present

## 2020-08-02 DIAGNOSIS — M5136 Other intervertebral disc degeneration, lumbar region: Secondary | ICD-10-CM | POA: Diagnosis not present

## 2020-08-02 DIAGNOSIS — M79662 Pain in left lower leg: Secondary | ICD-10-CM | POA: Diagnosis not present

## 2020-08-10 DIAGNOSIS — D518 Other vitamin B12 deficiency anemias: Secondary | ICD-10-CM | POA: Diagnosis not present

## 2020-08-11 DIAGNOSIS — M25562 Pain in left knee: Secondary | ICD-10-CM | POA: Diagnosis not present

## 2020-08-11 DIAGNOSIS — M25462 Effusion, left knee: Secondary | ICD-10-CM | POA: Diagnosis not present

## 2020-08-11 DIAGNOSIS — M1712 Unilateral primary osteoarthritis, left knee: Secondary | ICD-10-CM | POA: Diagnosis not present

## 2020-08-13 DIAGNOSIS — I639 Cerebral infarction, unspecified: Secondary | ICD-10-CM

## 2020-08-13 HISTORY — DX: Cerebral infarction, unspecified: I63.9

## 2020-08-14 DIAGNOSIS — G9341 Metabolic encephalopathy: Secondary | ICD-10-CM | POA: Diagnosis not present

## 2020-08-14 DIAGNOSIS — J9811 Atelectasis: Secondary | ICD-10-CM | POA: Diagnosis not present

## 2020-08-14 DIAGNOSIS — I10 Essential (primary) hypertension: Secondary | ICD-10-CM | POA: Diagnosis not present

## 2020-08-14 DIAGNOSIS — E860 Dehydration: Secondary | ICD-10-CM | POA: Diagnosis not present

## 2020-08-14 DIAGNOSIS — I313 Pericardial effusion (noninflammatory): Secondary | ICD-10-CM | POA: Diagnosis not present

## 2020-08-14 DIAGNOSIS — M25572 Pain in left ankle and joints of left foot: Secondary | ICD-10-CM | POA: Diagnosis not present

## 2020-08-14 DIAGNOSIS — J90 Pleural effusion, not elsewhere classified: Secondary | ICD-10-CM | POA: Diagnosis not present

## 2020-08-14 DIAGNOSIS — R4182 Altered mental status, unspecified: Secondary | ICD-10-CM | POA: Diagnosis not present

## 2020-08-14 DIAGNOSIS — R55 Syncope and collapse: Secondary | ICD-10-CM | POA: Diagnosis not present

## 2020-08-14 DIAGNOSIS — I639 Cerebral infarction, unspecified: Secondary | ICD-10-CM | POA: Diagnosis not present

## 2020-08-14 DIAGNOSIS — R4020 Unspecified coma: Secondary | ICD-10-CM | POA: Diagnosis not present

## 2020-08-14 DIAGNOSIS — G928 Other toxic encephalopathy: Secondary | ICD-10-CM | POA: Diagnosis not present

## 2020-08-14 DIAGNOSIS — G459 Transient cerebral ischemic attack, unspecified: Secondary | ICD-10-CM | POA: Diagnosis not present

## 2020-08-14 DIAGNOSIS — M7989 Other specified soft tissue disorders: Secondary | ICD-10-CM | POA: Diagnosis not present

## 2020-08-14 DIAGNOSIS — R4781 Slurred speech: Secondary | ICD-10-CM | POA: Diagnosis not present

## 2020-08-14 DIAGNOSIS — K219 Gastro-esophageal reflux disease without esophagitis: Secondary | ICD-10-CM | POA: Diagnosis not present

## 2020-08-14 DIAGNOSIS — I161 Hypertensive emergency: Secondary | ICD-10-CM | POA: Diagnosis not present

## 2020-08-14 DIAGNOSIS — N390 Urinary tract infection, site not specified: Secondary | ICD-10-CM | POA: Diagnosis not present

## 2020-08-14 DIAGNOSIS — I4510 Unspecified right bundle-branch block: Secondary | ICD-10-CM | POA: Diagnosis not present

## 2020-08-14 DIAGNOSIS — R531 Weakness: Secondary | ICD-10-CM | POA: Diagnosis not present

## 2020-08-14 DIAGNOSIS — N3000 Acute cystitis without hematuria: Secondary | ICD-10-CM | POA: Diagnosis not present

## 2020-08-14 DIAGNOSIS — B9620 Unspecified Escherichia coli [E. coli] as the cause of diseases classified elsewhere: Secondary | ICD-10-CM | POA: Diagnosis not present

## 2020-08-14 DIAGNOSIS — F419 Anxiety disorder, unspecified: Secondary | ICD-10-CM | POA: Diagnosis not present

## 2020-08-14 DIAGNOSIS — I6389 Other cerebral infarction: Secondary | ICD-10-CM | POA: Diagnosis not present

## 2020-08-14 DIAGNOSIS — I771 Stricture of artery: Secondary | ICD-10-CM | POA: Diagnosis not present

## 2020-08-14 DIAGNOSIS — M1990 Unspecified osteoarthritis, unspecified site: Secondary | ICD-10-CM | POA: Diagnosis not present

## 2020-08-14 DIAGNOSIS — I6523 Occlusion and stenosis of bilateral carotid arteries: Secondary | ICD-10-CM | POA: Diagnosis not present

## 2020-08-21 DIAGNOSIS — M1991 Primary osteoarthritis, unspecified site: Secondary | ICD-10-CM | POA: Diagnosis not present

## 2020-08-21 DIAGNOSIS — Z9181 History of falling: Secondary | ICD-10-CM | POA: Diagnosis not present

## 2020-08-21 DIAGNOSIS — I69328 Other speech and language deficits following cerebral infarction: Secondary | ICD-10-CM | POA: Diagnosis not present

## 2020-08-21 DIAGNOSIS — F419 Anxiety disorder, unspecified: Secondary | ICD-10-CM | POA: Diagnosis not present

## 2020-08-21 DIAGNOSIS — K5790 Diverticulosis of intestine, part unspecified, without perforation or abscess without bleeding: Secondary | ICD-10-CM | POA: Diagnosis not present

## 2020-08-21 DIAGNOSIS — S93402D Sprain of unspecified ligament of left ankle, subsequent encounter: Secondary | ICD-10-CM | POA: Diagnosis not present

## 2020-08-21 DIAGNOSIS — I10 Essential (primary) hypertension: Secondary | ICD-10-CM | POA: Diagnosis not present

## 2020-08-21 DIAGNOSIS — K219 Gastro-esophageal reflux disease without esophagitis: Secondary | ICD-10-CM | POA: Diagnosis not present

## 2020-08-24 ENCOUNTER — Telehealth: Payer: Self-pay | Admitting: Cardiology

## 2020-08-24 DIAGNOSIS — F419 Anxiety disorder, unspecified: Secondary | ICD-10-CM | POA: Diagnosis not present

## 2020-08-24 DIAGNOSIS — I10 Essential (primary) hypertension: Secondary | ICD-10-CM | POA: Diagnosis not present

## 2020-08-24 DIAGNOSIS — M1991 Primary osteoarthritis, unspecified site: Secondary | ICD-10-CM | POA: Diagnosis not present

## 2020-08-24 DIAGNOSIS — Z9181 History of falling: Secondary | ICD-10-CM | POA: Diagnosis not present

## 2020-08-24 DIAGNOSIS — I4891 Unspecified atrial fibrillation: Secondary | ICD-10-CM

## 2020-08-24 DIAGNOSIS — S93402D Sprain of unspecified ligament of left ankle, subsequent encounter: Secondary | ICD-10-CM | POA: Diagnosis not present

## 2020-08-24 DIAGNOSIS — K219 Gastro-esophageal reflux disease without esophagitis: Secondary | ICD-10-CM | POA: Diagnosis not present

## 2020-08-24 DIAGNOSIS — I69328 Other speech and language deficits following cerebral infarction: Secondary | ICD-10-CM | POA: Diagnosis not present

## 2020-08-24 DIAGNOSIS — K5790 Diverticulosis of intestine, part unspecified, without perforation or abscess without bleeding: Secondary | ICD-10-CM | POA: Diagnosis not present

## 2020-08-24 NOTE — Telephone Encounter (Signed)
Patient's daughter, Santiago Glad, states when patient was in the hospital with a stroke she was advised to follow up with Dr. Agustin Cree to have a heart monitor applied to check for sleep apnea and afib. Santiago Glad is aware that the patient is scheduled for 09/16/20 with Dr. Agustin Cree, but she would like to know if the patient can be seen sooner for monitor application. Order for monitor is needed.

## 2020-08-24 NOTE — Telephone Encounter (Signed)
Yes, please put Zio patch for 1 week on her

## 2020-08-25 DIAGNOSIS — M5136 Other intervertebral disc degeneration, lumbar region: Secondary | ICD-10-CM | POA: Diagnosis not present

## 2020-08-25 DIAGNOSIS — M7989 Other specified soft tissue disorders: Secondary | ICD-10-CM | POA: Diagnosis not present

## 2020-08-25 DIAGNOSIS — Z8673 Personal history of transient ischemic attack (TIA), and cerebral infarction without residual deficits: Secondary | ICD-10-CM

## 2020-08-25 DIAGNOSIS — K219 Gastro-esophageal reflux disease without esophagitis: Secondary | ICD-10-CM | POA: Diagnosis not present

## 2020-08-25 DIAGNOSIS — I2510 Atherosclerotic heart disease of native coronary artery without angina pectoris: Secondary | ICD-10-CM | POA: Diagnosis not present

## 2020-08-25 DIAGNOSIS — I10 Essential (primary) hypertension: Secondary | ICD-10-CM | POA: Diagnosis not present

## 2020-08-25 DIAGNOSIS — D649 Anemia, unspecified: Secondary | ICD-10-CM | POA: Diagnosis not present

## 2020-08-25 DIAGNOSIS — R5383 Other fatigue: Secondary | ICD-10-CM | POA: Diagnosis not present

## 2020-08-25 DIAGNOSIS — G4733 Obstructive sleep apnea (adult) (pediatric): Secondary | ICD-10-CM | POA: Diagnosis not present

## 2020-08-25 DIAGNOSIS — R5381 Other malaise: Secondary | ICD-10-CM | POA: Diagnosis not present

## 2020-08-25 HISTORY — DX: Personal history of transient ischemic attack (TIA), and cerebral infarction without residual deficits: Z86.73

## 2020-08-25 NOTE — Telephone Encounter (Signed)
Left message for patient to return call.

## 2020-08-26 DIAGNOSIS — Z9181 History of falling: Secondary | ICD-10-CM | POA: Diagnosis not present

## 2020-08-26 DIAGNOSIS — F419 Anxiety disorder, unspecified: Secondary | ICD-10-CM | POA: Diagnosis not present

## 2020-08-26 DIAGNOSIS — I69328 Other speech and language deficits following cerebral infarction: Secondary | ICD-10-CM | POA: Diagnosis not present

## 2020-08-26 DIAGNOSIS — K5790 Diverticulosis of intestine, part unspecified, without perforation or abscess without bleeding: Secondary | ICD-10-CM | POA: Diagnosis not present

## 2020-08-26 DIAGNOSIS — I10 Essential (primary) hypertension: Secondary | ICD-10-CM | POA: Diagnosis not present

## 2020-08-26 DIAGNOSIS — S93402D Sprain of unspecified ligament of left ankle, subsequent encounter: Secondary | ICD-10-CM | POA: Diagnosis not present

## 2020-08-26 DIAGNOSIS — M1991 Primary osteoarthritis, unspecified site: Secondary | ICD-10-CM | POA: Diagnosis not present

## 2020-08-26 DIAGNOSIS — K219 Gastro-esophageal reflux disease without esophagitis: Secondary | ICD-10-CM | POA: Diagnosis not present

## 2020-08-29 ENCOUNTER — Ambulatory Visit: Payer: Self-pay | Admitting: Podiatry

## 2020-08-30 DIAGNOSIS — K219 Gastro-esophageal reflux disease without esophagitis: Secondary | ICD-10-CM | POA: Diagnosis not present

## 2020-08-30 DIAGNOSIS — M1991 Primary osteoarthritis, unspecified site: Secondary | ICD-10-CM | POA: Diagnosis not present

## 2020-08-30 DIAGNOSIS — F419 Anxiety disorder, unspecified: Secondary | ICD-10-CM | POA: Diagnosis not present

## 2020-08-30 DIAGNOSIS — S93402D Sprain of unspecified ligament of left ankle, subsequent encounter: Secondary | ICD-10-CM | POA: Diagnosis not present

## 2020-08-30 DIAGNOSIS — Z9181 History of falling: Secondary | ICD-10-CM | POA: Diagnosis not present

## 2020-08-30 DIAGNOSIS — K5790 Diverticulosis of intestine, part unspecified, without perforation or abscess without bleeding: Secondary | ICD-10-CM | POA: Diagnosis not present

## 2020-08-30 DIAGNOSIS — I69328 Other speech and language deficits following cerebral infarction: Secondary | ICD-10-CM | POA: Diagnosis not present

## 2020-08-30 DIAGNOSIS — I10 Essential (primary) hypertension: Secondary | ICD-10-CM | POA: Diagnosis not present

## 2020-08-31 ENCOUNTER — Encounter: Payer: Self-pay | Admitting: Cardiology

## 2020-08-31 ENCOUNTER — Other Ambulatory Visit: Payer: Self-pay

## 2020-08-31 ENCOUNTER — Ambulatory Visit: Payer: Medicare PPO | Admitting: Cardiology

## 2020-08-31 ENCOUNTER — Ambulatory Visit (INDEPENDENT_AMBULATORY_CARE_PROVIDER_SITE_OTHER): Payer: Medicare PPO

## 2020-08-31 VITALS — BP 122/64 | HR 68 | Ht 61.0 in | Wt 130.0 lb

## 2020-08-31 DIAGNOSIS — I10 Essential (primary) hypertension: Secondary | ICD-10-CM

## 2020-08-31 DIAGNOSIS — I6930 Unspecified sequelae of cerebral infarction: Secondary | ICD-10-CM

## 2020-08-31 DIAGNOSIS — R0609 Other forms of dyspnea: Secondary | ICD-10-CM

## 2020-08-31 DIAGNOSIS — M7989 Other specified soft tissue disorders: Secondary | ICD-10-CM

## 2020-08-31 DIAGNOSIS — I2510 Atherosclerotic heart disease of native coronary artery without angina pectoris: Secondary | ICD-10-CM

## 2020-08-31 DIAGNOSIS — R0600 Dyspnea, unspecified: Secondary | ICD-10-CM

## 2020-08-31 HISTORY — DX: Unspecified sequelae of cerebral infarction: I69.30

## 2020-08-31 NOTE — Addendum Note (Signed)
Addended by: Senaida Ores on: 08/31/2020 02:02 PM   Modules accepted: Orders

## 2020-08-31 NOTE — Progress Notes (Signed)
Cardiology Office Note:    Date:  08/31/2020   ID:  Colleen Watkins, DOB 08/14/1932, MRN [id number]  PCP:  Mayer Camel, NP  Cardiologist:  Jenne Campus, MD    Referring MD: Nicoletta Dress, MD   Chief Complaint  Patient presents with  . Follow up stroke 08/13/2020    History of Present Illness:    Colleen Watkins is a 87 y.o. female with past medical history significant for essential hypertension, pericardial effusion that was never hemodynamically significant, normal noted on latest records from echocardiogram from 2 January of this year, recently presented to hospital with some difficulty speaking she was found to have acute CVA.  Ischemic, managed conservatively.  No clear-cut indications for anticoagulation has been identified.  Therefore she was referred to [place] for investigation of potential atrial fibrillation.  Her left atrium size is moderately enlarged.  She denies have any palpitations overall seems to be doing well.  No chest pain tightness squeezing pressure burning chest.  She is here with her granddaughter who is a Marine scientist and we had a long very good conversation about her situation.  Biggest concern they have right now is the fact that her blood pressure one time was very low and sometimes is on the lower side.  Be concerned about this but lately said she is doing well she disappointed because yesterday she got physical therapy visiting her and they told patient to use walker.  Past Medical History:  Diagnosis Date  . Anxiety   . Arthritis   . Cancer ([place])    skin  . Complication of anesthesia   . GERD (gastroesophageal reflux disease)    occ  . Hypertension   . PONV (postoperative nausea and vomiting)   . Stroke [facility]) 08/13/2020    Past Surgical History:  Procedure Laterality Date  . ANKLE FRACTURE SURGERY Right    25 yrs ago  . APPENDECTOMY    . BREAST SURGERY Left 2011   removed nodule-benign  . FOOT SURGERY Right    bunion 20 yrs ago  . KNEE  ARTHROSCOPY Left 2000  . LAPAROTOMY  50   intestinal tortion  . LUMBAR LAMINECTOMY/DECOMPRESSION MICRODISCECTOMY N/A 06/04/2013   Procedure: LUMBAR LAMINECTOMY/DECOMPRESSION MICRODISCECTOMY 1 LEVEL;  Surgeon: Eustace Moore, MD;  Location: [place] [facility];  Service: Neurosurgery;  Laterality: N/A;  LUMBAR LAMINECTOMY/DECOMPRESSION MICRODISCECTOMY 1 LEVEL  . LUMBAR LAMINECTOMY/DECOMPRESSION MICRODISCECTOMY Left 02/17/2015   Procedure: LUMBAR LAMINECTOMY/DECOMPRESSION MICRODISCECTOMY LUMBAR THREE FOUR;  Surgeon: Eustace Moore, MD;  Location: [place] [facility];  Service: Neurosurgery;  Laterality: Left;    Current Medications: Current Meds  Medication Sig  . acetaminophen (TYLENOL) 500 MG tablet Take 1,000 mg by mouth in the morning and at bedtime.  . ALPRAZolam (XANAX) 0.25 MG tablet Take 1 tablet by mouth 3 (three) times daily.  Marland Kitchen amLODipine (NORVASC) 5 MG tablet Take 5 mg by mouth daily.  Marland Kitchen aspirin EC 81 MG tablet Take 81 mg by mouth daily. Swallow whole.  Marland Kitchen atorvastatin (LIPITOR) 40 MG tablet Take 40 mg by mouth at bedtime.  . clopidogrel (PLAVIX) 75 MG tablet Take 75 mg by mouth daily.  Marland Kitchen escitalopram (LEXAPRO) 10 MG tablet Take 1 tablet by mouth daily.  . furosemide (LASIX) 40 MG tablet Take by mouth daily as needed for fluid. 1 to 1 1/2 tablets daily  . HYDROcodone-acetaminophen (NORCO/VICODIN) 5-325 MG tablet Take 1 tablet by mouth every 6 (six) hours as needed for moderate pain.  Marland Kitchen labetalol (NORMODYNE) 300 MG tablet  Take 1 tablet by mouth 2 (two) times daily.  Marland Kitchen losartan (COZAAR) 100 MG tablet Take 1 tablet by mouth daily.  . pantoprazole (PROTONIX) 40 MG tablet Take 1 tablet by mouth daily.     Allergies:   Oxycodone, Penicillins, Codeine, Hydrocortisone, and Sulfa antibiotics   Social History   Socioeconomic History  . Marital status: Widowed    Spouse name: Not on file  . Number of children: Not on file  . Years of education: Not on file  . Highest education level: Not on file   Occupational History  . Not on file  Tobacco Use  . Smoking status: Never Smoker  . Smokeless tobacco: Never Used  Vaping Use  . Vaping Use: Never used  Substance and Sexual Activity  . Alcohol use: No  . Drug use: No  . Sexual activity: Not on file  Other Topics Concern  . Not on file  Social History Narrative  . Not on file   Social Determinants of Health   Financial Resource Strain: Not on file  Food Insecurity: Not on file  Transportation Needs: Not on file  Physical Activity: Not on file  Stress: Not on file  Social Connections: Not on file     Family History: The patient's family history includes Breast cancer in her daughter; Diabetes in her sister; Ovarian cancer in her mother. ROS:   Please see the history of present illness.    All 14 point review of systems negative except as described per history of present illness  EKGs/Labs/Other Studies Reviewed:      Recent Labs: No results found for requested labs within last 8760 hours.  Recent Lipid Panel No results found for: CHOL, TRIG, HDL, CHOLHDL, VLDL, LDLCALC, LDLDIRECT  Physical Exam:    VS:  BP 122/64 (BP Location: Left Arm, Patient Position: Sitting)   Pulse 68   Ht 5\' 1"  (1.549 m)   Wt 130 lb (59 kg)   SpO2 93%   BMI 24.56 kg/m     Wt Readings from Last 3 Encounters:  08/31/20 130 lb (59 kg)  07/07/19 142 lb (64.4 kg)  04/06/19 140 lb 6.4 oz (63.7 kg)     GEN:  Well nourished, well developed in no acute distress HEENT: Normal NECK: No JVD; No carotid bruits LYMPHATICS: No lymphadenopathy CARDIAC: RRR, no murmurs, no rubs, no gallops RESPIRATORY:  Clear to auscultation without rales, wheezing or rhonchi  ABDOMEN: Soft, non-tender, non-distended MUSCULOSKELETAL:  No edema; No deformity  SKIN: Warm and dry LOWER EXTREMITIES: no swelling NEUROLOGIC:  Alert and oriented x 3 PSYCHIATRIC:  Normal affect   ASSESSMENT:    1. Coronary artery disease involving native coronary artery of  native heart without angina pectoris   2. Late effect of cerebrovascular accident (CVA)   3. Primary hypertension   4. Swelling of both lower extremities   5. Dyspnea on exertion    PLAN:    In order of problems listed above:  1. Coronary disease stable from that point review nonissue issue. 2. Late effect of CVA so far no clinical indications for anticoagulation.  She is being on dual antiplatelet therapy for short period of time, she is also on statin which I will continue.  Zio patch will be applied.  In the future she may require implantable loop recorder. 3. Essential hypertension actually blood pressure on the lower side and asked her to discontinue amlodipine. 4. Swelling of lower extremities with preserved left ventricle ejection fraction.  She does  have some diastolic dysfunction.  I will discontinue her amlodipine to help with the blood pressure hopefully this will help with the swelling. 5. Dyspnea exertion denies having any.  Overall she seems to be recovering nicely after CVA.  I did review record from hospital for this visit   Medication Adjustments/Labs and Tests Ordered: Current medicines are reviewed at length with the patient today.  Concerns regarding medicines are outlined above.  No orders of the defined types were placed in this encounter.  Medication changes: No orders of the defined types were placed in this encounter.   Signed, Park Liter, MD, [facility] 08/31/2020 1:44 PM     [facility]

## 2020-08-31 NOTE — Patient Instructions (Signed)
Medication Instructions:  Your physician recommends that you continue on your current medications as directed. Please refer to the Current Medication list given to you today.  *If you need a refill on your cardiac medications before your next appointment, please call your pharmacy*   Lab Work: None.  If you have labs (blood work) drawn today and your tests are completely normal, you will receive your results only by: . MyChart Message (if you have MyChart) OR . A paper copy in the mail If you have any lab test that is abnormal or we need to change your treatment, we will call you to review the results.   Testing/Procedures: A zio monitor was ordered today. It will remain on for 14 days. You will then return monitor and event diary in provided box. It takes 1-2 weeks for report to be downloaded and returned to us. We will call you with the results. If monitor falls off or has orange flashing light, please call Zio for further instructions.      Follow-Up: At [facility], you and your health needs are our priority.  As part of our continuing mission to provide you with exceptional heart care, we have created designated Provider Care Teams.  These Care Teams include your primary Cardiologist (physician) and Advanced Practice Providers (APPs -  Physician Assistants and Nurse Practitioners) who all work together to provide you with the care you need, when you need it.  We recommend signing up for the patient portal called "MyChart".  Sign up information is provided on this After Visit Summary.  MyChart is used to connect with patients for Virtual Visits (Telemedicine).  Patients are able to view lab/test results, encounter notes, upcoming appointments, etc.  Non-urgent messages can be sent to your provider as well.   To learn more about what you can do with MyChart, go to [email or web address].    Your next appointment:   6 week(s)  The format for your next appointment:   In  Person  Provider:   Robert Krasowski, MD   Other Instructions  

## 2020-09-01 DIAGNOSIS — Z9181 History of falling: Secondary | ICD-10-CM | POA: Diagnosis not present

## 2020-09-01 DIAGNOSIS — K219 Gastro-esophageal reflux disease without esophagitis: Secondary | ICD-10-CM | POA: Diagnosis not present

## 2020-09-01 DIAGNOSIS — I10 Essential (primary) hypertension: Secondary | ICD-10-CM | POA: Diagnosis not present

## 2020-09-01 DIAGNOSIS — I69328 Other speech and language deficits following cerebral infarction: Secondary | ICD-10-CM | POA: Diagnosis not present

## 2020-09-01 DIAGNOSIS — M1991 Primary osteoarthritis, unspecified site: Secondary | ICD-10-CM | POA: Diagnosis not present

## 2020-09-01 DIAGNOSIS — S93402D Sprain of unspecified ligament of left ankle, subsequent encounter: Secondary | ICD-10-CM | POA: Diagnosis not present

## 2020-09-01 DIAGNOSIS — K5790 Diverticulosis of intestine, part unspecified, without perforation or abscess without bleeding: Secondary | ICD-10-CM | POA: Diagnosis not present

## 2020-09-01 DIAGNOSIS — F419 Anxiety disorder, unspecified: Secondary | ICD-10-CM | POA: Diagnosis not present

## 2020-09-02 DIAGNOSIS — Z9181 History of falling: Secondary | ICD-10-CM | POA: Diagnosis not present

## 2020-09-02 DIAGNOSIS — K219 Gastro-esophageal reflux disease without esophagitis: Secondary | ICD-10-CM | POA: Diagnosis not present

## 2020-09-02 DIAGNOSIS — S93402D Sprain of unspecified ligament of left ankle, subsequent encounter: Secondary | ICD-10-CM | POA: Diagnosis not present

## 2020-09-02 DIAGNOSIS — K5790 Diverticulosis of intestine, part unspecified, without perforation or abscess without bleeding: Secondary | ICD-10-CM | POA: Diagnosis not present

## 2020-09-02 DIAGNOSIS — I10 Essential (primary) hypertension: Secondary | ICD-10-CM | POA: Diagnosis not present

## 2020-09-02 DIAGNOSIS — F419 Anxiety disorder, unspecified: Secondary | ICD-10-CM | POA: Diagnosis not present

## 2020-09-02 DIAGNOSIS — I69328 Other speech and language deficits following cerebral infarction: Secondary | ICD-10-CM | POA: Diagnosis not present

## 2020-09-02 DIAGNOSIS — M1991 Primary osteoarthritis, unspecified site: Secondary | ICD-10-CM | POA: Diagnosis not present

## 2020-09-02 NOTE — Telephone Encounter (Signed)
Patient was seen in office and monitor placed.

## 2020-09-05 DIAGNOSIS — R051 Acute cough: Secondary | ICD-10-CM | POA: Diagnosis not present

## 2020-09-05 DIAGNOSIS — J029 Acute pharyngitis, unspecified: Secondary | ICD-10-CM | POA: Diagnosis not present

## 2020-09-05 DIAGNOSIS — Z20828 Contact with and (suspected) exposure to other viral communicable diseases: Secondary | ICD-10-CM | POA: Diagnosis not present

## 2020-09-06 DIAGNOSIS — N390 Urinary tract infection, site not specified: Secondary | ICD-10-CM | POA: Diagnosis not present

## 2020-09-08 DIAGNOSIS — I10 Essential (primary) hypertension: Secondary | ICD-10-CM | POA: Diagnosis not present

## 2020-09-08 DIAGNOSIS — F419 Anxiety disorder, unspecified: Secondary | ICD-10-CM | POA: Diagnosis not present

## 2020-09-08 DIAGNOSIS — S93402D Sprain of unspecified ligament of left ankle, subsequent encounter: Secondary | ICD-10-CM | POA: Diagnosis not present

## 2020-09-08 DIAGNOSIS — Z9181 History of falling: Secondary | ICD-10-CM | POA: Diagnosis not present

## 2020-09-08 DIAGNOSIS — M1991 Primary osteoarthritis, unspecified site: Secondary | ICD-10-CM | POA: Diagnosis not present

## 2020-09-08 DIAGNOSIS — K219 Gastro-esophageal reflux disease without esophagitis: Secondary | ICD-10-CM | POA: Diagnosis not present

## 2020-09-08 DIAGNOSIS — I69328 Other speech and language deficits following cerebral infarction: Secondary | ICD-10-CM | POA: Diagnosis not present

## 2020-09-08 DIAGNOSIS — K5790 Diverticulosis of intestine, part unspecified, without perforation or abscess without bleeding: Secondary | ICD-10-CM | POA: Diagnosis not present

## 2020-09-14 DIAGNOSIS — M7989 Other specified soft tissue disorders: Secondary | ICD-10-CM | POA: Diagnosis not present

## 2020-09-14 DIAGNOSIS — R0600 Dyspnea, unspecified: Secondary | ICD-10-CM | POA: Diagnosis not present

## 2020-09-14 DIAGNOSIS — I6930 Unspecified sequelae of cerebral infarction: Secondary | ICD-10-CM | POA: Diagnosis not present

## 2020-09-14 DIAGNOSIS — I10 Essential (primary) hypertension: Secondary | ICD-10-CM | POA: Diagnosis not present

## 2020-09-14 DIAGNOSIS — I2510 Atherosclerotic heart disease of native coronary artery without angina pectoris: Secondary | ICD-10-CM | POA: Diagnosis not present

## 2020-09-14 DIAGNOSIS — R5383 Other fatigue: Secondary | ICD-10-CM | POA: Diagnosis not present

## 2020-09-14 DIAGNOSIS — G4733 Obstructive sleep apnea (adult) (pediatric): Secondary | ICD-10-CM | POA: Diagnosis not present

## 2020-09-15 DIAGNOSIS — I10 Essential (primary) hypertension: Secondary | ICD-10-CM | POA: Diagnosis not present

## 2020-09-15 DIAGNOSIS — F419 Anxiety disorder, unspecified: Secondary | ICD-10-CM | POA: Diagnosis not present

## 2020-09-15 DIAGNOSIS — M1991 Primary osteoarthritis, unspecified site: Secondary | ICD-10-CM | POA: Diagnosis not present

## 2020-09-15 DIAGNOSIS — K219 Gastro-esophageal reflux disease without esophagitis: Secondary | ICD-10-CM | POA: Diagnosis not present

## 2020-09-15 DIAGNOSIS — I69328 Other speech and language deficits following cerebral infarction: Secondary | ICD-10-CM | POA: Diagnosis not present

## 2020-09-15 DIAGNOSIS — S93402D Sprain of unspecified ligament of left ankle, subsequent encounter: Secondary | ICD-10-CM | POA: Diagnosis not present

## 2020-09-15 DIAGNOSIS — K5790 Diverticulosis of intestine, part unspecified, without perforation or abscess without bleeding: Secondary | ICD-10-CM | POA: Diagnosis not present

## 2020-09-15 DIAGNOSIS — Z9181 History of falling: Secondary | ICD-10-CM | POA: Diagnosis not present

## 2020-09-16 ENCOUNTER — Ambulatory Visit: Payer: Medicare PPO | Admitting: Cardiology

## 2020-09-16 DIAGNOSIS — S93402D Sprain of unspecified ligament of left ankle, subsequent encounter: Secondary | ICD-10-CM | POA: Diagnosis not present

## 2020-09-16 DIAGNOSIS — K219 Gastro-esophageal reflux disease without esophagitis: Secondary | ICD-10-CM | POA: Diagnosis not present

## 2020-09-16 DIAGNOSIS — Z9181 History of falling: Secondary | ICD-10-CM | POA: Diagnosis not present

## 2020-09-16 DIAGNOSIS — I69328 Other speech and language deficits following cerebral infarction: Secondary | ICD-10-CM | POA: Diagnosis not present

## 2020-09-16 DIAGNOSIS — I10 Essential (primary) hypertension: Secondary | ICD-10-CM | POA: Diagnosis not present

## 2020-09-16 DIAGNOSIS — F419 Anxiety disorder, unspecified: Secondary | ICD-10-CM | POA: Diagnosis not present

## 2020-09-16 DIAGNOSIS — K5790 Diverticulosis of intestine, part unspecified, without perforation or abscess without bleeding: Secondary | ICD-10-CM | POA: Diagnosis not present

## 2020-09-16 DIAGNOSIS — M1991 Primary osteoarthritis, unspecified site: Secondary | ICD-10-CM | POA: Diagnosis not present

## 2020-09-19 DIAGNOSIS — K5790 Diverticulosis of intestine, part unspecified, without perforation or abscess without bleeding: Secondary | ICD-10-CM | POA: Diagnosis not present

## 2020-09-19 DIAGNOSIS — S93402D Sprain of unspecified ligament of left ankle, subsequent encounter: Secondary | ICD-10-CM | POA: Diagnosis not present

## 2020-09-19 DIAGNOSIS — I6930 Unspecified sequelae of cerebral infarction: Secondary | ICD-10-CM | POA: Diagnosis not present

## 2020-09-19 DIAGNOSIS — I69328 Other speech and language deficits following cerebral infarction: Secondary | ICD-10-CM | POA: Diagnosis not present

## 2020-09-19 DIAGNOSIS — F419 Anxiety disorder, unspecified: Secondary | ICD-10-CM | POA: Diagnosis not present

## 2020-09-19 DIAGNOSIS — M1991 Primary osteoarthritis, unspecified site: Secondary | ICD-10-CM | POA: Diagnosis not present

## 2020-09-19 DIAGNOSIS — K219 Gastro-esophageal reflux disease without esophagitis: Secondary | ICD-10-CM | POA: Diagnosis not present

## 2020-09-19 DIAGNOSIS — Z9181 History of falling: Secondary | ICD-10-CM | POA: Diagnosis not present

## 2020-09-19 DIAGNOSIS — I10 Essential (primary) hypertension: Secondary | ICD-10-CM | POA: Diagnosis not present

## 2020-09-19 DIAGNOSIS — I2510 Atherosclerotic heart disease of native coronary artery without angina pectoris: Secondary | ICD-10-CM | POA: Diagnosis not present

## 2020-09-20 DIAGNOSIS — S93402D Sprain of unspecified ligament of left ankle, subsequent encounter: Secondary | ICD-10-CM | POA: Diagnosis not present

## 2020-09-20 DIAGNOSIS — K5790 Diverticulosis of intestine, part unspecified, without perforation or abscess without bleeding: Secondary | ICD-10-CM | POA: Diagnosis not present

## 2020-09-20 DIAGNOSIS — Z9181 History of falling: Secondary | ICD-10-CM | POA: Diagnosis not present

## 2020-09-20 DIAGNOSIS — I10 Essential (primary) hypertension: Secondary | ICD-10-CM | POA: Diagnosis not present

## 2020-09-20 DIAGNOSIS — M1991 Primary osteoarthritis, unspecified site: Secondary | ICD-10-CM | POA: Diagnosis not present

## 2020-09-20 DIAGNOSIS — I69328 Other speech and language deficits following cerebral infarction: Secondary | ICD-10-CM | POA: Diagnosis not present

## 2020-09-20 DIAGNOSIS — K219 Gastro-esophageal reflux disease without esophagitis: Secondary | ICD-10-CM | POA: Diagnosis not present

## 2020-09-20 DIAGNOSIS — F419 Anxiety disorder, unspecified: Secondary | ICD-10-CM | POA: Diagnosis not present

## 2020-09-28 DIAGNOSIS — E538 Deficiency of other specified B group vitamins: Secondary | ICD-10-CM | POA: Diagnosis not present

## 2020-09-28 DIAGNOSIS — R5383 Other fatigue: Secondary | ICD-10-CM | POA: Diagnosis not present

## 2020-09-28 DIAGNOSIS — G4733 Obstructive sleep apnea (adult) (pediatric): Secondary | ICD-10-CM | POA: Diagnosis not present

## 2020-09-28 DIAGNOSIS — I10 Essential (primary) hypertension: Secondary | ICD-10-CM | POA: Diagnosis not present

## 2020-09-28 DIAGNOSIS — N390 Urinary tract infection, site not specified: Secondary | ICD-10-CM | POA: Diagnosis not present

## 2020-09-28 DIAGNOSIS — Z8673 Personal history of transient ischemic attack (TIA), and cerebral infarction without residual deficits: Secondary | ICD-10-CM | POA: Diagnosis not present

## 2020-09-28 DIAGNOSIS — I2510 Atherosclerotic heart disease of native coronary artery without angina pectoris: Secondary | ICD-10-CM | POA: Diagnosis not present

## 2020-09-28 DIAGNOSIS — R5381 Other malaise: Secondary | ICD-10-CM | POA: Diagnosis not present

## 2020-09-28 DIAGNOSIS — K219 Gastro-esophageal reflux disease without esophagitis: Secondary | ICD-10-CM | POA: Diagnosis not present

## 2020-09-28 HISTORY — DX: Urinary tract infection, site not specified: N39.0

## 2020-09-29 DIAGNOSIS — Z8673 Personal history of transient ischemic attack (TIA), and cerebral infarction without residual deficits: Secondary | ICD-10-CM | POA: Diagnosis not present

## 2020-09-29 DIAGNOSIS — I639 Cerebral infarction, unspecified: Secondary | ICD-10-CM | POA: Diagnosis not present

## 2020-09-29 DIAGNOSIS — G609 Hereditary and idiopathic neuropathy, unspecified: Secondary | ICD-10-CM | POA: Diagnosis not present

## 2020-09-29 DIAGNOSIS — I10 Essential (primary) hypertension: Secondary | ICD-10-CM | POA: Diagnosis not present

## 2020-09-29 DIAGNOSIS — G4733 Obstructive sleep apnea (adult) (pediatric): Secondary | ICD-10-CM | POA: Diagnosis not present

## 2020-09-29 DIAGNOSIS — I6930 Unspecified sequelae of cerebral infarction: Secondary | ICD-10-CM | POA: Diagnosis not present

## 2020-09-30 DIAGNOSIS — K219 Gastro-esophageal reflux disease without esophagitis: Secondary | ICD-10-CM | POA: Diagnosis not present

## 2020-09-30 DIAGNOSIS — S93402D Sprain of unspecified ligament of left ankle, subsequent encounter: Secondary | ICD-10-CM | POA: Diagnosis not present

## 2020-09-30 DIAGNOSIS — I69328 Other speech and language deficits following cerebral infarction: Secondary | ICD-10-CM | POA: Diagnosis not present

## 2020-09-30 DIAGNOSIS — Z9181 History of falling: Secondary | ICD-10-CM | POA: Diagnosis not present

## 2020-09-30 DIAGNOSIS — I10 Essential (primary) hypertension: Secondary | ICD-10-CM | POA: Diagnosis not present

## 2020-09-30 DIAGNOSIS — M1991 Primary osteoarthritis, unspecified site: Secondary | ICD-10-CM | POA: Diagnosis not present

## 2020-09-30 DIAGNOSIS — K5790 Diverticulosis of intestine, part unspecified, without perforation or abscess without bleeding: Secondary | ICD-10-CM | POA: Diagnosis not present

## 2020-09-30 DIAGNOSIS — F419 Anxiety disorder, unspecified: Secondary | ICD-10-CM | POA: Diagnosis not present

## 2020-10-04 DIAGNOSIS — T8859XA Other complications of anesthesia, initial encounter: Secondary | ICD-10-CM | POA: Insufficient documentation

## 2020-10-04 DIAGNOSIS — F419 Anxiety disorder, unspecified: Secondary | ICD-10-CM | POA: Insufficient documentation

## 2020-10-04 DIAGNOSIS — M1990 Unspecified osteoarthritis, unspecified site: Secondary | ICD-10-CM | POA: Insufficient documentation

## 2020-10-04 DIAGNOSIS — Z9889 Other specified postprocedural states: Secondary | ICD-10-CM | POA: Insufficient documentation

## 2020-10-04 DIAGNOSIS — C801 Malignant (primary) neoplasm, unspecified: Secondary | ICD-10-CM | POA: Insufficient documentation

## 2020-10-04 DIAGNOSIS — I10 Essential (primary) hypertension: Secondary | ICD-10-CM | POA: Insufficient documentation

## 2020-10-04 DIAGNOSIS — R112 Nausea with vomiting, unspecified: Secondary | ICD-10-CM | POA: Insufficient documentation

## 2020-10-04 DIAGNOSIS — K219 Gastro-esophageal reflux disease without esophagitis: Secondary | ICD-10-CM | POA: Insufficient documentation

## 2020-10-07 DIAGNOSIS — F419 Anxiety disorder, unspecified: Secondary | ICD-10-CM | POA: Diagnosis not present

## 2020-10-07 DIAGNOSIS — K219 Gastro-esophageal reflux disease without esophagitis: Secondary | ICD-10-CM | POA: Diagnosis not present

## 2020-10-07 DIAGNOSIS — M1991 Primary osteoarthritis, unspecified site: Secondary | ICD-10-CM | POA: Diagnosis not present

## 2020-10-07 DIAGNOSIS — S93402D Sprain of unspecified ligament of left ankle, subsequent encounter: Secondary | ICD-10-CM | POA: Diagnosis not present

## 2020-10-07 DIAGNOSIS — I69328 Other speech and language deficits following cerebral infarction: Secondary | ICD-10-CM | POA: Diagnosis not present

## 2020-10-07 DIAGNOSIS — K5790 Diverticulosis of intestine, part unspecified, without perforation or abscess without bleeding: Secondary | ICD-10-CM | POA: Diagnosis not present

## 2020-10-07 DIAGNOSIS — Z9181 History of falling: Secondary | ICD-10-CM | POA: Diagnosis not present

## 2020-10-07 DIAGNOSIS — I10 Essential (primary) hypertension: Secondary | ICD-10-CM | POA: Diagnosis not present

## 2020-10-12 DIAGNOSIS — F419 Anxiety disorder, unspecified: Secondary | ICD-10-CM | POA: Diagnosis not present

## 2020-10-12 DIAGNOSIS — K219 Gastro-esophageal reflux disease without esophagitis: Secondary | ICD-10-CM | POA: Diagnosis not present

## 2020-10-12 DIAGNOSIS — Z9181 History of falling: Secondary | ICD-10-CM | POA: Diagnosis not present

## 2020-10-12 DIAGNOSIS — M1991 Primary osteoarthritis, unspecified site: Secondary | ICD-10-CM | POA: Diagnosis not present

## 2020-10-12 DIAGNOSIS — K5790 Diverticulosis of intestine, part unspecified, without perforation or abscess without bleeding: Secondary | ICD-10-CM | POA: Diagnosis not present

## 2020-10-12 DIAGNOSIS — I69328 Other speech and language deficits following cerebral infarction: Secondary | ICD-10-CM | POA: Diagnosis not present

## 2020-10-12 DIAGNOSIS — S93402D Sprain of unspecified ligament of left ankle, subsequent encounter: Secondary | ICD-10-CM | POA: Diagnosis not present

## 2020-10-12 DIAGNOSIS — I10 Essential (primary) hypertension: Secondary | ICD-10-CM | POA: Diagnosis not present

## 2020-10-13 ENCOUNTER — Other Ambulatory Visit: Payer: Self-pay

## 2020-10-13 ENCOUNTER — Ambulatory Visit: Payer: Medicare PPO | Admitting: Cardiology

## 2020-10-13 ENCOUNTER — Encounter: Payer: Self-pay | Admitting: Cardiology

## 2020-10-13 VITALS — BP 112/62 | HR 69 | Ht 61.0 in | Wt 133.0 lb

## 2020-10-13 DIAGNOSIS — G4733 Obstructive sleep apnea (adult) (pediatric): Secondary | ICD-10-CM

## 2020-10-13 DIAGNOSIS — I2510 Atherosclerotic heart disease of native coronary artery without angina pectoris: Secondary | ICD-10-CM | POA: Diagnosis not present

## 2020-10-13 DIAGNOSIS — I10 Essential (primary) hypertension: Secondary | ICD-10-CM | POA: Diagnosis not present

## 2020-10-13 DIAGNOSIS — I6930 Unspecified sequelae of cerebral infarction: Secondary | ICD-10-CM | POA: Diagnosis not present

## 2020-10-13 NOTE — Progress Notes (Signed)
Cardiology Office Note:    Date:  10/13/2020   ID:  [name], DOB 1932-10-10, MRN [id number]  PCP:  Mayer Camel, NP  Cardiologist:  Jenne Campus, MD    Referring MD: Bess Harvest*   Chief Complaint  Patient presents with  . Monitor results    History of Present Illness:    Colleen Watkins is a 87 y.o. female with past medical history significant for CVA, likely she recovered quite nicely after CVA and doing well.  She also had history of pericardial effusion that was never hemodynamically significant, essential hypertension and sleep apnea.  She comes today to my office discuss results of her monitor which showed multiple episode of supraventricular tachycardia but no clear-cut evidence of atrial fibrillation.  We initiated conversation about potentially inserting implantable loop recorder.  This is especially in view of the fact that she does have enlarged left atrium.  Also from the history of them getting from her daughter she probably does have significant sleep apnea.  She is not too thrilled about potentially having implantable loop recorder.  I showed them how it looks like I described to them procedure I told him is typically very easy and straightforward procedure with no major complications she still is not thrilled about it.  She says she wants to think about it.  Therefore we left it that we can wait until her sleep study will be available.  Her daughter described the fact that she stopped breathing at night.  I suspect she does have significant sleep apnea and if that is the case we will press on getting implantable loop recorder.  Luckily overall clinically doing well there is no new TIA CVA-like symptoms.  Past Medical History:  Diagnosis Date  . Abdominal pain 06/29/2019  . Anemia 07/01/2020  . Anxiety   . Arthritis   . Cancer ([place])    skin  . Chronic pain of left knee 01/21/2017  . Complication of anesthesia   . Coronary artery disease  involving native coronary artery of native heart without angina pectoris 05/04/2015   No symptoms, calcification noted the CT  . Dyspnea on exertion 01/28/2019  . GERD (gastroesophageal reflux disease)    occ  . History of GI bleed 05/04/2015  . History of stroke 08/25/2020   Formatting of this note might be different from the original. Left side of pons  . HTN (hypertension) 04/21/2013  . Hypertension   . Late effect of cerebrovascular accident (CVA) 08/31/2020  . Malaise and fatigue 06/30/2020  . Obstructive sleep apnea syndrome 06/30/2020  . Pericardial effusion 05/04/2015   Moderate by last echocardiogram  . Pes anserinus bursitis of right knee 12/29/2019  . PONV (postoperative nausea and vomiting)   . Recurrent UTI 09/28/2020  . S/P lumbar laminectomy 02/17/2015  . Stroke ([place]) 08/13/2020  . Swelling of both lower extremities 01/28/2019  . Trochanteric bursitis of right hip 12/29/2019    Past Surgical History:  Procedure Laterality Date  . ANKLE FRACTURE SURGERY Right    25 yrs ago  . APPENDECTOMY    . BREAST SURGERY Left 2011   removed nodule-benign  . FOOT SURGERY Right    bunion 20 yrs ago  . KNEE ARTHROSCOPY Left 2000  . LAPAROTOMY  50   intestinal tortion  . LUMBAR LAMINECTOMY/DECOMPRESSION MICRODISCECTOMY N/A 06/04/2013   Procedure: LUMBAR LAMINECTOMY/DECOMPRESSION MICRODISCECTOMY 1 LEVEL;  Surgeon: Eustace Moore, MD;  Location: [place] [facility];  Service: Neurosurgery;  Laterality: N/A;  LUMBAR LAMINECTOMY/DECOMPRESSION MICRODISCECTOMY  1 LEVEL  . LUMBAR LAMINECTOMY/DECOMPRESSION MICRODISCECTOMY Left 02/17/2015   Procedure: LUMBAR LAMINECTOMY/DECOMPRESSION MICRODISCECTOMY LUMBAR THREE FOUR;  Surgeon: Eustace Moore, MD;  Location: [place] [facility];  Service: Neurosurgery;  Laterality: Left;    Current Medications: Current Meds  Medication Sig  . acetaminophen (TYLENOL) 500 MG tablet Take 1,000 mg by mouth in the morning and at bedtime.  Marland Kitchen aspirin EC 81 MG tablet Take 81 mg by mouth  daily. Swallow whole.  Marland Kitchen atorvastatin (LIPITOR) 40 MG tablet Take 40 mg by mouth at bedtime.  . clopidogrel (PLAVIX) 75 MG tablet Take 75 mg by mouth daily.  Marland Kitchen escitalopram (LEXAPRO) 10 MG tablet Take 1 tablet by mouth daily.  . furosemide (LASIX) 40 MG tablet Take by mouth daily as needed for fluid. 1 to 1 1/2 tablets daily  . labetalol (NORMODYNE) 300 MG tablet Take 1 tablet by mouth 2 (two) times daily.  Marland Kitchen losartan (COZAAR) 100 MG tablet Take 1 tablet by mouth daily.  . pantoprazole (PROTONIX) 40 MG tablet Take 1 tablet by mouth daily.     Allergies:   Oxycodone, Penicillins, Codeine, Hydrocortisone, and Sulfa antibiotics   Social History   Socioeconomic History  . Marital status: Widowed    Spouse name: Not on file  . Number of children: Not on file  . Years of education: Not on file  . Highest education level: Not on file  Occupational History  . Not on file  Tobacco Use  . Smoking status: Never Smoker  . Smokeless tobacco: Never Used  Vaping Use  . Vaping Use: Never used  Substance and Sexual Activity  . Alcohol use: No  . Drug use: No  . Sexual activity: Not on file  Other Topics Concern  . Not on file  Social History Narrative  . Not on file   Social Determinants of Health   Financial Resource Strain: Not on file  Food Insecurity: Not on file  Transportation Needs: Not on file  Physical Activity: Not on file  Stress: Not on file  Social Connections: Not on file     Family History: The patient's family history includes Breast cancer in her daughter; Diabetes in her sister; Ovarian cancer in her mother. ROS:   Please see the history of present illness.    All 14 point review of systems negative except as described per history of present illness  EKGs/Labs/Other Studies Reviewed:      Recent Labs: No results found for requested labs within last 8760 hours.  Recent Lipid Panel No results found for: CHOL, TRIG, HDL, CHOLHDL, VLDL, LDLCALC,  LDLDIRECT  Physical Exam:    VS:  BP 112/62 (BP Location: Left Arm, Patient Position: Sitting)   Pulse 69   Ht 5\' 1"  (1.549 m)   Wt 133 lb (60.3 kg)   SpO2 95%   BMI 25.13 kg/m     Wt Readings from Last 3 Encounters:  10/13/20 133 lb (60.3 kg)  08/31/20 130 lb (59 kg)  07/07/19 142 lb (64.4 kg)     GEN:  Well nourished, well developed in no acute distress HEENT: Normal NECK: No JVD; No carotid bruits LYMPHATICS: No lymphadenopathy CARDIAC: RRR, no murmurs, no rubs, no gallops RESPIRATORY:  Clear to auscultation without rales, wheezing or rhonchi  ABDOMEN: Soft, non-tender, non-distended MUSCULOSKELETAL:  No edema; No deformity  SKIN: Warm and dry LOWER EXTREMITIES: no swelling NEUROLOGIC:  Alert and oriented x 3 PSYCHIATRIC:  Normal affect   ASSESSMENT:    1. Late  effect of cerebrovascular accident (CVA)   2. Primary hypertension   3. Coronary artery disease involving native coronary artery of native heart without angina pectoris   4. Obstructive sleep apnea syndrome    PLAN:    In order of problems listed above:  1. Late effect of CVA.  Recovering nicely.  No new issues. 2. Essential hypertension blood pressure well controlled continue present management. 3. Coronary disease stable. 4. Obstructive sleep apnea she is scheduled to have sleep study done within the next few days will wait for results of it. 5. We had a long discussion about potential implantable loop recorder insertion.  She is not interested in this right now.  However I will revisit that issue 6. When I see her we will continue this discussion.   Medication Adjustments/Labs and Tests Ordered: Current medicines are reviewed at length with the patient today.  Concerns regarding medicines are outlined above.  No orders of the defined types were placed in this encounter.  Medication changes: No orders of the defined types were placed in this encounter.   Signed, Park Liter, MD,  [facility] 10/13/2020 11:37 AM    [place]

## 2020-10-13 NOTE — Patient Instructions (Signed)

## 2020-10-14 DIAGNOSIS — K5790 Diverticulosis of intestine, part unspecified, without perforation or abscess without bleeding: Secondary | ICD-10-CM | POA: Diagnosis not present

## 2020-10-14 DIAGNOSIS — Z9181 History of falling: Secondary | ICD-10-CM | POA: Diagnosis not present

## 2020-10-14 DIAGNOSIS — M1991 Primary osteoarthritis, unspecified site: Secondary | ICD-10-CM | POA: Diagnosis not present

## 2020-10-14 DIAGNOSIS — I69328 Other speech and language deficits following cerebral infarction: Secondary | ICD-10-CM | POA: Diagnosis not present

## 2020-10-14 DIAGNOSIS — K219 Gastro-esophageal reflux disease without esophagitis: Secondary | ICD-10-CM | POA: Diagnosis not present

## 2020-10-14 DIAGNOSIS — S93402D Sprain of unspecified ligament of left ankle, subsequent encounter: Secondary | ICD-10-CM | POA: Diagnosis not present

## 2020-10-14 DIAGNOSIS — F419 Anxiety disorder, unspecified: Secondary | ICD-10-CM | POA: Diagnosis not present

## 2020-10-14 DIAGNOSIS — I10 Essential (primary) hypertension: Secondary | ICD-10-CM | POA: Diagnosis not present

## 2020-10-17 DIAGNOSIS — I69328 Other speech and language deficits following cerebral infarction: Secondary | ICD-10-CM | POA: Diagnosis not present

## 2020-10-17 DIAGNOSIS — S93402D Sprain of unspecified ligament of left ankle, subsequent encounter: Secondary | ICD-10-CM | POA: Diagnosis not present

## 2020-10-17 DIAGNOSIS — K219 Gastro-esophageal reflux disease without esophagitis: Secondary | ICD-10-CM | POA: Diagnosis not present

## 2020-10-17 DIAGNOSIS — K5790 Diverticulosis of intestine, part unspecified, without perforation or abscess without bleeding: Secondary | ICD-10-CM | POA: Diagnosis not present

## 2020-10-17 DIAGNOSIS — G4733 Obstructive sleep apnea (adult) (pediatric): Secondary | ICD-10-CM | POA: Diagnosis not present

## 2020-10-17 DIAGNOSIS — Z9181 History of falling: Secondary | ICD-10-CM | POA: Diagnosis not present

## 2020-10-17 DIAGNOSIS — I10 Essential (primary) hypertension: Secondary | ICD-10-CM | POA: Diagnosis not present

## 2020-10-17 DIAGNOSIS — F419 Anxiety disorder, unspecified: Secondary | ICD-10-CM | POA: Diagnosis not present

## 2020-10-17 DIAGNOSIS — M1991 Primary osteoarthritis, unspecified site: Secondary | ICD-10-CM | POA: Diagnosis not present

## 2020-10-24 DIAGNOSIS — R5383 Other fatigue: Secondary | ICD-10-CM | POA: Diagnosis not present

## 2020-10-24 DIAGNOSIS — G4733 Obstructive sleep apnea (adult) (pediatric): Secondary | ICD-10-CM | POA: Diagnosis not present

## 2020-10-31 DIAGNOSIS — N390 Urinary tract infection, site not specified: Secondary | ICD-10-CM | POA: Diagnosis not present

## 2020-10-31 DIAGNOSIS — E538 Deficiency of other specified B group vitamins: Secondary | ICD-10-CM | POA: Diagnosis not present

## 2020-11-03 DIAGNOSIS — M25562 Pain in left knee: Secondary | ICD-10-CM | POA: Diagnosis not present

## 2020-11-03 DIAGNOSIS — G8929 Other chronic pain: Secondary | ICD-10-CM | POA: Diagnosis not present

## 2020-12-01 DIAGNOSIS — R350 Frequency of micturition: Secondary | ICD-10-CM | POA: Diagnosis not present

## 2020-12-01 DIAGNOSIS — R600 Localized edema: Secondary | ICD-10-CM | POA: Diagnosis not present

## 2020-12-01 DIAGNOSIS — N390 Urinary tract infection, site not specified: Secondary | ICD-10-CM | POA: Diagnosis not present

## 2020-12-02 DIAGNOSIS — E538 Deficiency of other specified B group vitamins: Secondary | ICD-10-CM | POA: Diagnosis not present

## 2020-12-13 ENCOUNTER — Other Ambulatory Visit: Payer: Self-pay

## 2020-12-13 ENCOUNTER — Ambulatory Visit: Payer: Medicare PPO | Admitting: Cardiology

## 2020-12-13 ENCOUNTER — Encounter: Payer: Self-pay | Admitting: Cardiology

## 2020-12-13 VITALS — BP 136/64 | HR 72 | Ht 61.0 in | Wt 133.0 lb

## 2020-12-13 DIAGNOSIS — R0600 Dyspnea, unspecified: Secondary | ICD-10-CM | POA: Diagnosis not present

## 2020-12-13 DIAGNOSIS — R0609 Other forms of dyspnea: Secondary | ICD-10-CM

## 2020-12-13 DIAGNOSIS — I6930 Unspecified sequelae of cerebral infarction: Secondary | ICD-10-CM | POA: Diagnosis not present

## 2020-12-13 DIAGNOSIS — I10 Essential (primary) hypertension: Secondary | ICD-10-CM

## 2020-12-13 DIAGNOSIS — I2510 Atherosclerotic heart disease of native coronary artery without angina pectoris: Secondary | ICD-10-CM | POA: Diagnosis not present

## 2020-12-13 NOTE — Patient Instructions (Signed)

## 2020-12-13 NOTE — Progress Notes (Signed)
Cardiology Office Note:    Date:  12/13/2020   ID:  Colleen Watkins, DOB 06/16/1933, MRN [id number]  PCP:  Mayer Camel, NP  Cardiologist:  Jenne Campus, MD    Referring MD: Bess Harvest*   Chief Complaint  Patient presents with  . Follow-up  I am doing fine and I do not want anything  History of Present Illness:    Colleen Watkins is a 87 y.o. female with past medical history significant for CVA likely she recovered quite nicely after CVA, history of pericardial effusion which was never hemodynamically significant, essential hypertension, sleep apnea, coronary artery disease only as calcification of coronary artery on the CT of the chest.  She comes today to my office discuss issue with potential have implantable loop recorder.  She did wear monitor which showed some SVT but no clear-cut atrial fibrillation still trying to debate the etiology of her CVA.  She denies have any chest pain tightness squeezing pressure burning chest overall she says she is doing well we talked again about implantable loop recorder she clearly does not want it.  Past Medical History:  Diagnosis Date  . Abdominal pain 06/29/2019  . Anemia 07/01/2020  . Anxiety   . Arthritis   . Cancer ([place])    skin  . Chronic pain of left knee 01/21/2017  . Complication of anesthesia   . Coronary artery disease involving native coronary artery of native heart without angina pectoris 05/04/2015   No symptoms, calcification noted the CT  . Cyst of ovary 06/29/2019  . Dyspnea on exertion 01/28/2019  . GERD (gastroesophageal reflux disease)    occ  . History of GI bleed 05/04/2015  . History of stroke 08/25/2020   Formatting of this note might be different from the original. Left side of pons  . HTN (hypertension) 04/21/2013  . Hypertension   . Late effect of cerebrovascular accident (CVA) 08/31/2020  . Malaise and fatigue 06/30/2020  . Obstructive sleep apnea syndrome 06/30/2020  . Pericardial effusion  05/04/2015   Moderate by last echocardiogram  . Pes anserinus bursitis of right knee 12/29/2019  . PONV (postoperative nausea and vomiting)   . Recurrent UTI 09/28/2020  . S/P lumbar laminectomy 02/17/2015  . Stroke ([place]) 08/13/2020  . Swelling of both lower extremities 01/28/2019  . Trochanteric bursitis of right hip 12/29/2019    Past Surgical History:  Procedure Laterality Date  . ANKLE FRACTURE SURGERY Right    25 yrs ago  . APPENDECTOMY    . BREAST SURGERY Left 2011   removed nodule-benign  . FOOT SURGERY Right    bunion 20 yrs ago  . KNEE ARTHROSCOPY Left 2000  . LAPAROTOMY  50   intestinal tortion  . LUMBAR LAMINECTOMY/DECOMPRESSION MICRODISCECTOMY N/A 06/04/2013   Procedure: LUMBAR LAMINECTOMY/DECOMPRESSION MICRODISCECTOMY 1 LEVEL;  Surgeon: Eustace Moore, MD;  Location: [place] [facility];  Service: Neurosurgery;  Laterality: N/A;  LUMBAR LAMINECTOMY/DECOMPRESSION MICRODISCECTOMY 1 LEVEL  . LUMBAR LAMINECTOMY/DECOMPRESSION MICRODISCECTOMY Left 02/17/2015   Procedure: LUMBAR LAMINECTOMY/DECOMPRESSION MICRODISCECTOMY LUMBAR THREE FOUR;  Surgeon: Eustace Moore, MD;  Location: [place] [facility];  Service: Neurosurgery;  Laterality: Left;    Current Medications: Current Meds  Medication Sig  . aspirin EC 81 MG tablet Take 325 mg by mouth daily. Swallow whole.  Marland Kitchen atorvastatin (LIPITOR) 40 MG tablet Take 40 mg by mouth at bedtime.  Marland Kitchen escitalopram (LEXAPRO) 10 MG tablet Take 1 tablet by mouth daily.  . furosemide (LASIX) 40 MG tablet Take 40 mg by  mouth daily. 1 to 1 1/2 tablets daily  . labetalol (NORMODYNE) 300 MG tablet Take 200 mg by mouth daily.  Marland Kitchen losartan (COZAAR) 100 MG tablet Take 100 mg by mouth daily.  . nitrofurantoin (MACRODANTIN) 100 MG capsule Take 100 mg by mouth daily. In the morning  . pantoprazole (PROTONIX) 40 MG tablet Take 40 mg by mouth daily.     Allergies:   Oxycodone, Penicillins, Codeine, Hydrocortisone, and Sulfa antibiotics   Social History   Socioeconomic  History  . Marital status: Widowed    Spouse name: Not on file  . Number of children: Not on file  . Years of education: Not on file  . Highest education level: Not on file  Occupational History  . Not on file  Tobacco Use  . Smoking status: Never Smoker  . Smokeless tobacco: Never Used  Vaping Use  . Vaping Use: Never used  Substance and Sexual Activity  . Alcohol use: No  . Drug use: No  . Sexual activity: Not on file  Other Topics Concern  . Not on file  Social History Narrative  . Not on file   Social Determinants of Health   Financial Resource Strain: Not on file  Food Insecurity: Not on file  Transportation Needs: Not on file  Physical Activity: Not on file  Stress: Not on file  Social Connections: Not on file     Family History: The patient's family history includes Breast cancer in her daughter; Diabetes in her sister; Ovarian cancer in her mother. ROS:   Please see the history of present illness.    All 14 point review of systems negative except as described per history of present illness  EKGs/Labs/Other Studies Reviewed:      Recent Labs: No results found for requested labs within last 8760 hours.  Recent Lipid Panel No results found for: CHOL, TRIG, HDL, CHOLHDL, VLDL, LDLCALC, LDLDIRECT  Physical Exam:    VS:  BP 136/64 (BP Location: Right Arm, Patient Position: Sitting)   Pulse 72   Ht 5\' 1"  (1.549 m)   Wt 133 lb (60.3 kg)   SpO2 95%   BMI 25.13 kg/m     Wt Readings from Last 3 Encounters:  12/13/20 133 lb (60.3 kg)  10/13/20 133 lb (60.3 kg)  08/31/20 130 lb (59 kg)     GEN:  Well nourished, well developed in no acute distress HEENT: Normal NECK: No JVD; No carotid bruits LYMPHATICS: No lymphadenopathy CARDIAC: RRR, no murmurs, no rubs, no gallops RESPIRATORY:  Clear to auscultation without rales, wheezing or rhonchi  ABDOMEN: Soft, non-tender, non-distended MUSCULOSKELETAL:  No edema; No deformity  SKIN: Warm and dry LOWER  EXTREMITIES: no swelling NEUROLOGIC:  Alert and oriented x 3 PSYCHIATRIC:  Normal affect   ASSESSMENT:    1. Late effect of cerebrovascular accident (CVA)   2. Dyspnea on exertion   3. Coronary artery disease involving native coronary artery of native heart without angina pectoris   4. Primary hypertension   5. Hypertension, unspecified type    PLAN:    In order of problems listed above:  1. Late effect of CVA.  Doing well from that point review.  Risk factors modification and antiplatelet therapy as well as statin which we will continue. 2. Dyspnea on exertion doing better right now trying to exercise or be more she does have physical therapy who comes and works with her. 3. Coronary disease in form of calcification of coronary arteries.  She is asymptomatic  continue present management. 4. Essential hypertension blood pressure well controlled continue present management. 5.  6. Overall she is doing well I will continue present management see her back in 6 months   Medication Adjustments/Labs and Tests Ordered: Current medicines are reviewed at length with the patient today.  Concerns regarding medicines are outlined above.  No orders of the defined types were placed in this encounter.  Medication changes: No orders of the defined types were placed in this encounter.   Signed, Park Liter, MD, [facility] 12/13/2020 1:10 PM    [place]

## 2020-12-14 DIAGNOSIS — H6123 Impacted cerumen, bilateral: Secondary | ICD-10-CM | POA: Diagnosis not present

## 2020-12-20 DIAGNOSIS — H9193 Unspecified hearing loss, bilateral: Secondary | ICD-10-CM | POA: Diagnosis not present

## 2020-12-20 DIAGNOSIS — H6123 Impacted cerumen, bilateral: Secondary | ICD-10-CM | POA: Diagnosis not present

## 2020-12-20 DIAGNOSIS — H61303 Acquired stenosis of external ear canal, unspecified, bilateral: Secondary | ICD-10-CM | POA: Diagnosis not present

## 2020-12-28 DIAGNOSIS — Z8673 Personal history of transient ischemic attack (TIA), and cerebral infarction without residual deficits: Secondary | ICD-10-CM | POA: Diagnosis not present

## 2020-12-28 DIAGNOSIS — G4733 Obstructive sleep apnea (adult) (pediatric): Secondary | ICD-10-CM | POA: Diagnosis not present

## 2020-12-28 DIAGNOSIS — G609 Hereditary and idiopathic neuropathy, unspecified: Secondary | ICD-10-CM | POA: Diagnosis not present

## 2020-12-28 DIAGNOSIS — I10 Essential (primary) hypertension: Secondary | ICD-10-CM | POA: Diagnosis not present

## 2020-12-29 DIAGNOSIS — K219 Gastro-esophageal reflux disease without esophagitis: Secondary | ICD-10-CM | POA: Diagnosis not present

## 2020-12-29 DIAGNOSIS — I2510 Atherosclerotic heart disease of native coronary artery without angina pectoris: Secondary | ICD-10-CM | POA: Diagnosis not present

## 2020-12-29 DIAGNOSIS — D649 Anemia, unspecified: Secondary | ICD-10-CM | POA: Diagnosis not present

## 2020-12-29 DIAGNOSIS — R5383 Other fatigue: Secondary | ICD-10-CM | POA: Diagnosis not present

## 2020-12-29 DIAGNOSIS — G4733 Obstructive sleep apnea (adult) (pediatric): Secondary | ICD-10-CM | POA: Diagnosis not present

## 2020-12-29 DIAGNOSIS — R5381 Other malaise: Secondary | ICD-10-CM | POA: Diagnosis not present

## 2020-12-29 DIAGNOSIS — M25562 Pain in left knee: Secondary | ICD-10-CM | POA: Diagnosis not present

## 2020-12-29 DIAGNOSIS — M25571 Pain in right ankle and joints of right foot: Secondary | ICD-10-CM | POA: Diagnosis not present

## 2020-12-29 DIAGNOSIS — I10 Essential (primary) hypertension: Secondary | ICD-10-CM | POA: Diagnosis not present

## 2020-12-29 DIAGNOSIS — M5136 Other intervertebral disc degeneration, lumbar region: Secondary | ICD-10-CM | POA: Diagnosis not present

## 2020-12-29 DIAGNOSIS — I6930 Unspecified sequelae of cerebral infarction: Secondary | ICD-10-CM | POA: Diagnosis not present

## 2021-01-20 ENCOUNTER — Other Ambulatory Visit: Payer: Self-pay

## 2021-01-30 DIAGNOSIS — E538 Deficiency of other specified B group vitamins: Secondary | ICD-10-CM | POA: Diagnosis not present

## 2021-02-02 DIAGNOSIS — M1712 Unilateral primary osteoarthritis, left knee: Secondary | ICD-10-CM | POA: Diagnosis not present

## 2021-02-07 DIAGNOSIS — K529 Noninfective gastroenteritis and colitis, unspecified: Secondary | ICD-10-CM | POA: Diagnosis not present

## 2021-02-07 DIAGNOSIS — Z20822 Contact with and (suspected) exposure to covid-19: Secondary | ICD-10-CM | POA: Diagnosis not present

## 2021-02-07 DIAGNOSIS — B370 Candidal stomatitis: Secondary | ICD-10-CM | POA: Diagnosis not present

## 2021-02-07 DIAGNOSIS — R6889 Other general symptoms and signs: Secondary | ICD-10-CM | POA: Diagnosis not present

## 2021-03-02 DIAGNOSIS — N390 Urinary tract infection, site not specified: Secondary | ICD-10-CM | POA: Diagnosis not present

## 2021-03-02 DIAGNOSIS — R600 Localized edema: Secondary | ICD-10-CM | POA: Diagnosis not present

## 2021-03-02 DIAGNOSIS — R350 Frequency of micturition: Secondary | ICD-10-CM | POA: Diagnosis not present

## 2021-03-02 DIAGNOSIS — N952 Postmenopausal atrophic vaginitis: Secondary | ICD-10-CM | POA: Diagnosis not present

## 2021-03-03 DIAGNOSIS — E538 Deficiency of other specified B group vitamins: Secondary | ICD-10-CM | POA: Diagnosis not present

## 2021-03-23 DIAGNOSIS — M1712 Unilateral primary osteoarthritis, left knee: Secondary | ICD-10-CM | POA: Diagnosis not present

## 2021-04-03 DIAGNOSIS — E538 Deficiency of other specified B group vitamins: Secondary | ICD-10-CM | POA: Diagnosis not present

## 2021-04-04 DIAGNOSIS — N390 Urinary tract infection, site not specified: Secondary | ICD-10-CM | POA: Diagnosis not present

## 2021-04-20 DIAGNOSIS — M1712 Unilateral primary osteoarthritis, left knee: Secondary | ICD-10-CM | POA: Diagnosis not present

## 2021-04-20 DIAGNOSIS — M21062 Valgus deformity, not elsewhere classified, left knee: Secondary | ICD-10-CM | POA: Diagnosis not present

## 2021-04-20 DIAGNOSIS — M25562 Pain in left knee: Secondary | ICD-10-CM | POA: Diagnosis not present

## 2021-04-28 DIAGNOSIS — M25562 Pain in left knee: Secondary | ICD-10-CM | POA: Diagnosis not present

## 2021-05-02 DIAGNOSIS — G4733 Obstructive sleep apnea (adult) (pediatric): Secondary | ICD-10-CM | POA: Diagnosis not present

## 2021-05-02 DIAGNOSIS — K219 Gastro-esophageal reflux disease without esophagitis: Secondary | ICD-10-CM | POA: Diagnosis not present

## 2021-05-02 DIAGNOSIS — M5136 Other intervertebral disc degeneration, lumbar region: Secondary | ICD-10-CM | POA: Diagnosis not present

## 2021-05-02 DIAGNOSIS — I2510 Atherosclerotic heart disease of native coronary artery without angina pectoris: Secondary | ICD-10-CM | POA: Diagnosis not present

## 2021-05-02 DIAGNOSIS — R5383 Other fatigue: Secondary | ICD-10-CM | POA: Diagnosis not present

## 2021-05-02 DIAGNOSIS — I129 Hypertensive chronic kidney disease with stage 1 through stage 4 chronic kidney disease, or unspecified chronic kidney disease: Secondary | ICD-10-CM | POA: Diagnosis not present

## 2021-05-02 DIAGNOSIS — K529 Noninfective gastroenteritis and colitis, unspecified: Secondary | ICD-10-CM | POA: Diagnosis not present

## 2021-05-02 DIAGNOSIS — N390 Urinary tract infection, site not specified: Secondary | ICD-10-CM | POA: Diagnosis not present

## 2021-05-02 DIAGNOSIS — R7309 Other abnormal glucose: Secondary | ICD-10-CM | POA: Diagnosis not present

## 2021-05-02 DIAGNOSIS — Z23 Encounter for immunization: Secondary | ICD-10-CM | POA: Diagnosis not present

## 2021-05-02 DIAGNOSIS — N1832 Chronic kidney disease, stage 3b: Secondary | ICD-10-CM | POA: Insufficient documentation

## 2021-05-02 DIAGNOSIS — I6930 Unspecified sequelae of cerebral infarction: Secondary | ICD-10-CM | POA: Diagnosis not present

## 2021-05-02 DIAGNOSIS — R5381 Other malaise: Secondary | ICD-10-CM | POA: Diagnosis not present

## 2021-05-04 DIAGNOSIS — M1712 Unilateral primary osteoarthritis, left knee: Secondary | ICD-10-CM | POA: Diagnosis not present

## 2021-05-04 DIAGNOSIS — M21062 Valgus deformity, not elsewhere classified, left knee: Secondary | ICD-10-CM | POA: Diagnosis not present

## 2021-05-04 DIAGNOSIS — E538 Deficiency of other specified B group vitamins: Secondary | ICD-10-CM | POA: Diagnosis not present

## 2021-06-05 DIAGNOSIS — E538 Deficiency of other specified B group vitamins: Secondary | ICD-10-CM | POA: Diagnosis not present

## 2021-06-27 ENCOUNTER — Encounter: Payer: Self-pay | Admitting: Cardiology

## 2021-06-27 ENCOUNTER — Other Ambulatory Visit: Payer: Self-pay

## 2021-06-27 ENCOUNTER — Ambulatory Visit: Payer: Medicare PPO | Admitting: Cardiology

## 2021-06-27 VITALS — BP 124/72 | HR 67 | Ht 61.0 in | Wt 138.2 lb

## 2021-06-27 DIAGNOSIS — I2510 Atherosclerotic heart disease of native coronary artery without angina pectoris: Secondary | ICD-10-CM

## 2021-06-27 DIAGNOSIS — Z01810 Encounter for preprocedural cardiovascular examination: Secondary | ICD-10-CM | POA: Diagnosis not present

## 2021-06-27 DIAGNOSIS — I6930 Unspecified sequelae of cerebral infarction: Secondary | ICD-10-CM

## 2021-06-27 DIAGNOSIS — R079 Chest pain, unspecified: Secondary | ICD-10-CM

## 2021-06-27 DIAGNOSIS — I444 Left anterior fascicular block: Secondary | ICD-10-CM

## 2021-06-27 DIAGNOSIS — I10 Essential (primary) hypertension: Secondary | ICD-10-CM

## 2021-06-27 DIAGNOSIS — I4510 Unspecified right bundle-branch block: Secondary | ICD-10-CM

## 2021-06-27 DIAGNOSIS — I3139 Other pericardial effusion (noninflammatory): Secondary | ICD-10-CM | POA: Diagnosis not present

## 2021-06-27 NOTE — Progress Notes (Signed)
Cardiology Office Note:    Date:  06/27/2021   ID:  Colleen Watkins, DOB [birth year], MRN [id number]  PCP:  Mayer Camel, NP  Cardiologist:  Jenne Campus, MD    Referring MD: Bess Harvest*   Chief Complaint  Patient presents with   Clearance TBD    Left Knee Replacement TBD, Dr. Lara Mulch    History of Present Illness:    Colleen Watkins is a 87 y.o. female with past medical history significant for CVA that she suffered more than a year ago, likely recovered quite completely from it, pericardial effusion which was never hemodynamically significant, essential hypertension, sleep apnea, coronary artery disease only in form of calcification of the coronary arteries noted on CT.  She did wear a monitor to try to figure out she had any atrial fibrillation that could explain the episode of CVA however it was unrevealing then we had discussion about potentially wearing loop recorder implantable however she did not want to do it. She comes today for different reason she suffered from a lot of pain in her left knee and she decided that she needs to have a knee surgery.  She said she suffered so much with this knee that she cannot live like that and she wants something to be done about it.  Cardiac wise she denies have any chest pain tightness squeezing pressure burning chest, obviously her ability to exercise limited because of severe knee pain.  Therefore she does not meet criteria of ability of exercise at least 4 METS before that surgery.  Past Medical History:  Diagnosis Date   Abdominal pain 06/29/2019   Anemia 07/01/2020   Anxiety    Arthritis    Cancer ([place])    skin   Chronic pain of left knee 8/54/6270   Complication of anesthesia    Coronary artery disease involving native coronary artery of native heart without angina pectoris 05/04/2015   No symptoms, calcification noted the CT   Cyst of ovary 06/29/2019   Dyspnea on exertion 01/28/2019   GERD  (gastroesophageal reflux disease)    occ   History of GI bleed 05/04/2015   History of stroke 08/25/2020   Formatting of this note might be different from the original. Left side of pons   HTN (hypertension) 04/21/2013   Hypertension    Late effect of cerebrovascular accident (CVA) 08/31/2020   Malaise and fatigue 06/30/2020   Obstructive sleep apnea syndrome 06/30/2020   Pericardial effusion 05/04/2015   Moderate by last echocardiogram   Pes anserinus bursitis of right knee 12/29/2019   PONV (postoperative nausea and vomiting)    Recurrent UTI 09/28/2020   S/P lumbar laminectomy 02/17/2015   Stroke ([place]) 08/13/2020   Swelling of both lower extremities 01/28/2019   Trochanteric bursitis of right hip 12/29/2019    Past Surgical History:  Procedure Laterality Date   ANKLE FRACTURE SURGERY Right    25 yrs ago   APPENDECTOMY     BREAST SURGERY Left 2011   removed nodule-benign   FOOT SURGERY Right    bunion 20 yrs ago   KNEE ARTHROSCOPY Left 2000   LAPAROTOMY  50   intestinal tortion   LUMBAR LAMINECTOMY/DECOMPRESSION MICRODISCECTOMY N/A 06/04/2013   Procedure: LUMBAR LAMINECTOMY/DECOMPRESSION MICRODISCECTOMY 1 LEVEL;  Surgeon: Eustace Moore, MD;  Location: [facility];  Service: Neurosurgery;  Laterality: N/A;  LUMBAR LAMINECTOMY/DECOMPRESSION MICRODISCECTOMY 1 LEVEL   LUMBAR LAMINECTOMY/DECOMPRESSION MICRODISCECTOMY Left 02/17/2015   Procedure: LUMBAR LAMINECTOMY/DECOMPRESSION MICRODISCECTOMY LUMBAR THREE FOUR;  Surgeon:  Eustace Moore, MD;  Location: [place] [facility];  Service: Neurosurgery;  Laterality: Left;    Current Medications: Current Meds  Medication Sig   aspirin 325 MG tablet Take 325 mg by mouth daily.   atorvastatin (LIPITOR) 40 MG tablet Take 40 mg by mouth at bedtime.   escitalopram (LEXAPRO) 10 MG tablet Take 1 tablet by mouth daily.   furosemide (LASIX) 40 MG tablet Take 40 mg by mouth daily. 1 to 1 1/2 tablets daily   labetalol (NORMODYNE) 300 MG tablet Take 200 mg by  mouth daily.   losartan (COZAAR) 100 MG tablet Take 100 mg by mouth daily.   pantoprazole (PROTONIX) 40 MG tablet Take 40 mg by mouth daily.   Turmeric (QC TUMERIC COMPLEX PO) Take 1 tablet by mouth daily. Unknown strength   [DISCONTINUED] aspirin EC 81 MG tablet Take 325 mg by mouth daily. Swallow whole.     Allergies:   Oxycodone, Penicillins, Codeine, Hydrocortisone, and Sulfa antibiotics   Social History   Socioeconomic History   Marital status: Widowed    Spouse name: Not on file   Number of children: Not on file   Years of education: Not on file   Highest education level: Not on file  Occupational History   Not on file  Tobacco Use   Smoking status: Never   Smokeless tobacco: Never  Vaping Use   Vaping Use: Never used  Substance and Sexual Activity   Alcohol use: No   Drug use: No   Sexual activity: Not on file  Other Topics Concern   Not on file  Social History Narrative   Not on file   Social Determinants of Health   Financial Resource Strain: Not on file  Food Insecurity: Not on file  Transportation Needs: Not on file  Physical Activity: Not on file  Stress: Not on file  Social Connections: Not on file     Family History: The patient's family history includes Breast cancer in her daughter; Diabetes in her sister; Ovarian cancer in her mother. ROS:   Please see the history of present illness.    All 14 point review of systems negative except as described per history of present illness  EKGs/Labs/Other Studies Reviewed:      Recent Labs: No results found for requested labs within last 8760 hours.  Recent Lipid Panel No results found for: CHOL, TRIG, HDL, CHOLHDL, VLDL, LDLCALC, LDLDIRECT  Physical Exam:    VS:  BP 124/72 (BP Location: Right Arm, Patient Position: Sitting)   Pulse 67   Ht 5\' 1"  (1.549 m)   Wt 138 lb 3.2 oz (62.7 kg)   SpO2 93%   BMI 26.11 kg/m     Wt Readings from Last 3 Encounters:  06/27/21 138 lb 3.2 oz (62.7 kg)   12/13/20 133 lb (60.3 kg)  10/13/20 133 lb (60.3 kg)     GEN:  Well nourished, well developed in no acute distress HEENT: Normal NECK: No JVD; No carotid bruits LYMPHATICS: No lymphadenopathy CARDIAC: RRR, no murmurs, no rubs, no gallops RESPIRATORY:  Clear to auscultation without rales, wheezing or rhonchi  ABDOMEN: Soft, non-tender, non-distended MUSCULOSKELETAL:  No edema; No deformity  SKIN: Warm and dry LOWER EXTREMITIES: no swelling NEUROLOGIC:  Alert and oriented x 3 PSYCHIATRIC:  Normal affect   ASSESSMENT:    1. Coronary artery disease involving native coronary artery of native heart without angina pectoris   2. Preop cardiovascular exam   3. Primary hypertension   4.  Pericardial effusion   5. Late effect of cerebrovascular accident (CVA)    PLAN:    In order of problems listed above:  Cardiovascular preop evaluation.  She does have calcification of the coronary arteries which not surprising somebody her age.  I think it would be reasonable to perform stress test on her and we will do a Lexiscan to make sure she does not have any inducible ischemia.  As a part of evaluation I will also do echocardiogram to assess left ventricle ejection fraction check on the amount of fluid that she had in her pericardium. Essential hypertension blood pressure well controlled continue present management. Pericardial fusion echocardiogram will be done to reassess that Late effects of CVA, no new problems. 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   Medication Adjustments/Labs and Tests Ordered: Current medicines are reviewed at length with the patient today.  Concerns regarding medicines are outlined above.  No orders of the defined types were placed in this encounter.  Medication changes: No orders of the defined types were placed in this encounter.   Signed, Park Liter, MD, [facility] 06/27/2021 11:17 AM    [place]

## 2021-06-27 NOTE — Patient Instructions (Signed)
Medication Instructions:  Your physician recommends that you continue on your current medications as directed. Please refer to the Current Medication list given to you today.  *If you need a refill on your cardiac medications before your next appointment, please call your pharmacy*   Lab Work: None If you have labs (blood work) drawn today and your tests are completely normal, you will receive your results only by: [place] (if you have MyChart) OR A paper copy in the mail If you have any lab test that is abnormal or we need to change your treatment, we will call you to review the results.   Testing/Procedures: Your physician has requested that you have an echocardiogram. Echocardiography is a painless test that uses sound waves to create images of your heart. It provides your doctor with information about the size and shape of your heart and how well your heart's chambers and valves are working. This procedure takes approximately one hour. There are no restrictions for this procedure.    [facility] [address] [place], [place] [address] Phone:  [phone]    Please arrive 15 minutes prior to your appointment time for registration and insurance purposes.  The test will take approximately 3 to 4 hours to complete; you may bring reading material.  If someone comes with you to your appointment, they will need to remain in the main lobby due to limited space in the testing area. **If you are pregnant or breastfeeding, please notify the nuclear lab prior to your appointment**  How to prepare for your Myocardial Perfusion Test: Do not eat or drink 3 hours prior to your test, except you may have water. Do not consume products containing caffeine (regular or decaffeinated) 12 hours prior to your test. (ex: coffee, chocolate, sodas, tea). Do bring a list of your current medications with you.  If not listed below, you may take your medications as  normal.  Do wear comfortable clothes (no dresses or overalls) and walking shoes, tennis shoes preferred (No heels or open toe shoes are allowed). Do NOT wear cologne, perfume, aftershave, or lotions (deodorant is allowed). If these instructions are not followed, your test will have to be rescheduled.  Please report to [address] for your test.  If you have questions or concerns about your appointment, you can call the [facility] at [phone].  If you cannot keep your appointment, please provide 24 hours notification to the Nuclear Lab, to avoid a possible $50 charge to your account.    Follow-Up: At [facility], you and your health needs are our priority.  As part of our continuing mission to provide you with exceptional heart care, we have created designated Provider Care Teams.  These Care Teams include your primary Cardiologist (physician) and Advanced Practice Providers (APPs -  Physician Assistants and Nurse Practitioners) who all work together to provide you with the care you need, when you need it.  We recommend signing up for the patient portal called "MyChart".  Sign up information is provided on this After Visit Summary.  MyChart is used to connect with patients for Virtual Visits (Telemedicine).  Patients are able to view lab/test results, encounter notes, upcoming appointments, etc.  Non-urgent messages can be sent to your provider as well.   To learn more about what you can do with MyChart, go to [email or web address].    Your next appointment:   6 month(s)  The format for your next appointment:  In Person  Provider:   Jenne Campus, MD    Other Instructions  Echocardiogram An echocardiogram is a test that uses sound waves (ultrasound) to produce images of the heart. Images from an echocardiogram can provide important information about: Heart size and shape. The size and thickness and movement of your heart's  walls. Heart muscle function and strength. Heart valve function or if you have stenosis. Stenosis is when the heart valves are too narrow. If blood is flowing backward through the heart valves (regurgitation). A tumor or infectious growth around the heart valves. Areas of heart muscle that are not working well because of poor blood flow or injury from a heart attack. Aneurysm detection. An aneurysm is a weak or damaged part of an artery wall. The wall bulges out from the normal force of blood pumping through the body. Tell a health care provider about: Any allergies you have. All medicines you are taking, including vitamins, herbs, eye drops, creams, and over-the-counter medicines. Any blood disorders you have. Any surgeries you have had. Any medical conditions you have. Whether you are pregnant or may be pregnant. What are the risks? Generally, this is a safe test. However, problems may occur, including an allergic reaction to dye (contrast) that may be used during the test. What happens before the test? No specific preparation is needed. You may eat and drink normally. What happens during the test?  You will take off your clothes from the waist up and put on a hospital gown. Electrodes or electrocardiogram (ECG)patches may be placed on your chest. The electrodes or patches are then connected to a device that monitors your heart rate and rhythm. You will lie down on a table for an ultrasound exam. A gel will be applied to your chest to help sound waves pass through your skin. A handheld device, called a transducer, will be pressed against your chest and moved over your heart. The transducer produces sound waves that travel to your heart and bounce back (or "echo" back) to the transducer. These sound waves will be captured in real-time and changed into images of your heart that can be viewed on a video monitor. The images will be recorded on a computer and reviewed by your health care  provider. You may be asked to change positions or hold your breath for a short time. This makes it easier to get different views or better views of your heart. In some cases, you may receive contrast through an IV in one of your veins. This can improve the quality of the pictures from your heart. The procedure may vary among health care providers and hospitals. What can I expect after the test? You may return to your normal, everyday life, including diet, activities, and medicines, unless your health care provider tells you not to do that. Follow these instructions at home: It is up to you to get the results of your test. Ask your health care provider, or the department that is doing the test, when your results will be ready. Keep all follow-up visits. This is important. Summary An echocardiogram is a test that uses sound waves (ultrasound) to produce images of the heart. Images from an echocardiogram can provide important information about the size and shape of your heart, heart muscle function, heart valve function, and other possible heart problems. You do not need to do anything to prepare before this test. You may eat and drink normally. After the echocardiogram is completed, you may return to your normal, everyday  life, unless your health care provider tells you not to do that. This information is not intended to replace advice given to you by your health care provider. Make sure you discuss any questions you have with your health care provider. Document Revised: 04/12/2021 Document Reviewed: 03/22/2020 Elsevier Patient Education  2022 [place].  Cardiac Nuclear Scan A cardiac nuclear scan is a test that measures blood flow to the heart when a person is resting and when he or she is exercising. The test looks for problems such as: Not enough blood reaching a portion of the heart. The heart muscle not working normally. You may need this test if: You have heart disease. You have had  abnormal lab results. You have had heart surgery or a balloon procedure to open up blocked arteries (angioplasty). You have chest pain. You have shortness of breath. In this test, a radioactive dye (tracer) is injected into your bloodstream. After the tracer has traveled to your heart, an imaging device is used to measure how much of the tracer is absorbed by or distributed to various areas of your heart. This procedure is usually done at a hospital and takes 2-4 hours. Tell a health care provider about: Any allergies you have. All medicines you are taking, including vitamins, herbs, eye drops, creams, and over-the-counter medicines. Any problems you or family members have had with anesthetic medicines. Any blood disorders you have. Any surgeries you have had. Any medical conditions you have. Whether you are pregnant or may be pregnant. What are the risks? Generally, this is a safe procedure. However, problems may occur, including: Serious chest pain and heart attack. This is only a risk if the stress portion of the test is done. Rapid heartbeat. Sensation of warmth in your chest. This usually passes quickly. Allergic reaction to the tracer. What happens before the procedure? Ask your health care provider about changing or stopping your regular medicines. This is especially important if you are taking diabetes medicines or blood thinners. Follow instructions from your health care provider about eating or drinking restrictions. Remove your jewelry on the day of the procedure. What happens during the procedure? An IV will be inserted into one of your veins. Your health care provider will inject a small amount of radioactive tracer through the IV. You will wait for 20-40 minutes while the tracer travels through your bloodstream. Your heart activity will be monitored with an electrocardiogram (ECG). You will lie down on an exam table. Images of your heart will be taken for about 15-20  minutes. You may also have a stress test. For this test, one of the following may be done: You will exercise on a treadmill or stationary bike. While you exercise, your heart's activity will be monitored with an ECG, and your blood pressure will be checked. You will be given medicines that will increase blood flow to parts of your heart. This is done if you are unable to exercise. When blood flow to your heart has peaked, a tracer will again be injected through the IV. After 20-40 minutes, you will get back on the exam table and have more images taken of your heart. Depending on the type of tracer used, scans may need to be repeated 3-4 hours later. Your IV line will be removed when the procedure is over. The procedure may vary among health care providers and hospitals. What happens after the procedure? Unless your health care provider tells you otherwise, you may return to your normal schedule, including diet,  activities, and medicines. Unless your health care provider tells you otherwise, you may increase your fluid intake. This will help to flush the contrast dye from your body. Drink enough fluid to keep your urine pale yellow. Ask your health care provider, or the department that is doing the test: When will my results be ready? How will I get my results? Summary A cardiac nuclear scan measures the blood flow to the heart when a person is resting and when he or she is exercising. Tell your health care provider if you are pregnant. Before the procedure, ask your health care provider about changing or stopping your regular medicines. This is especially important if you are taking diabetes medicines or blood thinners. After the procedure, unless your health care provider tells you otherwise, increase your fluid intake. This will help flush the contrast dye from your body. After the procedure, unless your health care provider tells you otherwise, you may return to your normal schedule, including  diet, activities, and medicines. This information is not intended to replace advice given to you by your health care provider. Make sure you discuss any questions you have with your health care provider. Document Revised: 04/12/2021 Document Reviewed: 01/11/2021 Elsevier Patient Education  [place].

## 2021-06-28 ENCOUNTER — Ambulatory Visit (INDEPENDENT_AMBULATORY_CARE_PROVIDER_SITE_OTHER): Payer: Medicare PPO

## 2021-06-28 DIAGNOSIS — R079 Chest pain, unspecified: Secondary | ICD-10-CM

## 2021-06-28 LAB — MYOCARDIAL PERFUSION IMAGING
LV dias vol: 75 mL (ref 46–106)
LV sys vol: 22 mL
Nuc Stress EF: 71 %
Peak HR: 77 {beats}/min
Rest HR: 63 {beats}/min
Rest Nuclear Isotope Dose: 8.5 mCi
SDS: 1
SRS: 1
SSS: 2
ST Depression (mm): 0 mm
Stress Nuclear Isotope Dose: 25.2 mCi
TID: 1.14

## 2021-06-28 MED ORDER — TECHNETIUM TC 99M TETROFOSMIN IV KIT
25.2000 | PACK | Freq: Once | INTRAVENOUS | Status: AC | PRN
  Administered 2021-06-28: 25.2 via INTRAVENOUS

## 2021-06-28 MED ORDER — TECHNETIUM TC 99M TETROFOSMIN IV KIT
8.5000 | PACK | Freq: Once | INTRAVENOUS | Status: AC | PRN
  Administered 2021-06-28: 8.5 via INTRAVENOUS

## 2021-06-28 MED ORDER — REGADENOSON 0.4 MG/5ML IV SOLN
0.4000 mg | Freq: Once | INTRAVENOUS | Status: AC
  Administered 2021-06-28: 0.4 mg via INTRAVENOUS

## 2021-06-29 ENCOUNTER — Telehealth: Payer: Self-pay

## 2021-06-29 NOTE — Telephone Encounter (Signed)
-----   Message from Park Liter, MD sent at 06/29/2021 11:50 AM EST ----- Stress test showing no ischemia

## 2021-06-29 NOTE — Telephone Encounter (Signed)
Spoke with Karen(on DPR), advised of results.

## 2021-07-18 ENCOUNTER — Other Ambulatory Visit: Payer: Medicare PPO

## 2021-07-26 ENCOUNTER — Ambulatory Visit (INDEPENDENT_AMBULATORY_CARE_PROVIDER_SITE_OTHER): Payer: Medicare PPO

## 2021-07-26 ENCOUNTER — Other Ambulatory Visit: Payer: Self-pay

## 2021-07-26 DIAGNOSIS — I10 Essential (primary) hypertension: Secondary | ICD-10-CM

## 2021-07-26 DIAGNOSIS — R079 Chest pain, unspecified: Secondary | ICD-10-CM

## 2021-07-26 DIAGNOSIS — Z01810 Encounter for preprocedural cardiovascular examination: Secondary | ICD-10-CM

## 2021-07-26 DIAGNOSIS — I2510 Atherosclerotic heart disease of native coronary artery without angina pectoris: Secondary | ICD-10-CM

## 2021-07-26 DIAGNOSIS — I3139 Other pericardial effusion (noninflammatory): Secondary | ICD-10-CM | POA: Diagnosis not present

## 2021-07-26 DIAGNOSIS — I6930 Unspecified sequelae of cerebral infarction: Secondary | ICD-10-CM

## 2021-07-26 LAB — ECHOCARDIOGRAM COMPLETE
Area-P 1/2: 3.66 cm2
MV VTI: 2.03 cm2
S' Lateral: 2.6 cm

## 2021-07-28 ENCOUNTER — Ambulatory Visit: Payer: Medicare PPO | Admitting: Cardiology

## 2021-07-31 ENCOUNTER — Telehealth: Payer: Self-pay

## 2021-07-31 NOTE — Telephone Encounter (Signed)
° °  [facility] Pre-operative Risk Assessment    Request for surgical clearance:  What type of surgery is being performed? Total Knee Replacement   When is this surgery scheduled? TBD   What type of clearance is required (medical clearance vs. Pharmacy clearance to hold med vs. Both)? Medical Clearance   Are there any medications that need to be held prior to surgery and how long?Not specified however, the patient is on ASA 366m   Practice name and name of physician performing surgery? Dr. [facility]   What is your office phone number: [phone]   7.   What is your office fax number: [phone].   Anesthesia type (None, local, MAC, general) ? Not specified  Dr. LRuel Favorsoffice is requesting copies of any recent test results and office notes.  Colleen Watkins 07/31/2021, 7:36 AM  _________________________________________________________________   (provider comments below)

## 2021-07-31 NOTE — Telephone Encounter (Signed)
° °  Primary Cardiologist: Jenne Campus, MD  Chart reviewed as part of pre-operative protocol coverage. Given past medical history and time since last visit, based on ACC/AHA guidelines, Elexa Kivi would be at acceptable risk for the planned procedure without further cardiovascular testing.   Her RCRI risk is a class III risk, 6.6% risk of major cardiac event.  I will route this recommendation to the requesting party via Epic fax function and remove from pre-op pool.  Please call with questions.  Jossie Ng. Gilma Bessette NP-C    07/31/2021, 9:48 AM [address] Office [phone] Fax [phone]

## 2021-08-04 ENCOUNTER — Other Ambulatory Visit: Payer: Medicare PPO

## 2021-08-28 NOTE — Progress Notes (Signed)
Surgery orders requested via Epic inbox. °

## 2021-09-13 ENCOUNTER — Other Ambulatory Visit: Payer: Self-pay | Admitting: Orthopedic Surgery

## 2021-09-13 DIAGNOSIS — M25562 Pain in left knee: Secondary | ICD-10-CM

## 2021-09-13 DIAGNOSIS — G8929 Other chronic pain: Secondary | ICD-10-CM

## 2021-09-13 NOTE — Patient Instructions (Addendum)
DUE TO COVID-19 ONLY ONE VISITOR IS ALLOWED TO COME WITH YOU AND STAY IN THE WAITING ROOM ONLY DURING PRE OP AND PROCEDURE.   **NO VISITORS ARE ALLOWED IN THE SHORT STAY AREA OR RECOVERY ROOM!!**  IF YOU WILL BE ADMITTED INTO THE HOSPITAL YOU ARE ALLOWED ONLY TWO SUPPORT PEOPLE DURING VISITATION HOURS ONLY (7 AM -8PM)   The support person(s) must pass our screening, gel in and out, and wear a mask at all times, including in the patients room. Patients must also wear a mask when staff or their support person are in the room. Visitors GUEST BADGE MUST BE WORN VISIBLY  One adult visitor may remain with you overnight and MUST be in the room by 8 P.M.  No visitors under the age of 15. Any visitor under the age of 29 must be accompanied by an adult.    COVID SWAB TESTING MUST BE COMPLETED ON:  09/14/21 @ 9:00 AM   Site: [facility] [place].  [place] Enter: Main Entrance have a seat in the waiting area to the right of main entrance (DO NOT [place]!!!!!) Dial: [phone] to alert staff you have arrived  You are not required to quarantine, however you are required to wear a well-fitted mask when you are out and around people not in your household.  Hand Hygiene often Do NOT share personal items Notify your provider if you are in close contact with someone who has COVID or you develop fever 100.4 or greater, new onset of sneezing, cough, sore throat, shortness of breath or body aches.  [place], [address], must go inside of the hospital, NOT A DRIVE THRU!  (Must self quarantine after testing. Follow instructions on handout.)       Your procedure is scheduled on: 09/18/21   Report to [facility] Main Entrance    Report to admitting at: 6:15  AM   Call this number if you have problems the morning of surgery [phone]   Do not eat food :After Midnight.   May have liquids until :  6:00 AM   day of surgery  CLEAR LIQUID DIET  Foods Allowed                                                                     Foods Excluded  Water, Black Coffee and tea, regular and decaf                             liquids that you cannot  Plain Jell-O in any flavor  (No red)                                           see through such as: Fruit ices (not with fruit pulp)                                     milk, soups, orange juice  Iced Popsicles (No red)                                    All solid food                                   Apple juices Sports drinks like Gatorade (No red) Lightly seasoned clear broth or consume(fat free) Sugar Sample Menu Breakfast                                Lunch                                     Supper Cranberry juice                    Beef broth                            Chicken broth Jell-O                                     Grape juice                           Apple juice Coffee or tea                        Jell-O                                      Popsicle                                                Coffee or tea                        Coffee or tea     Complete one Ensure drink the morning of surgery 3 hours prior to scheduled surgery at: 6:00 AM.    The day of surgery:  Drink ONE (1) Pre-Surgery Clear Ensure or G2 at AM the morning of surgery. Drink in one sitting. Do not sip.  This drink was given to you during your hospital  pre-op appointment visit. Nothing else to drink after completing the  Pre-Surgery Clear Ensure or G2.          If you have questions, please contact your surgeons office.   Oral Hygiene is also important to reduce your risk of infection.                                    Remember - BRUSH YOUR TEETH THE MORNING OF SURGERY WITH YOUR REGULAR TOOTHPASTE   Do NOT smoke after Midnight   Take these medicines the morning of surgery with A SIP OF  WATER: labetalol,amlodipine,pantoprazole.  DO  NOT TAKE ANY ORAL DIABETIC MEDICATIONS DAY OF YOUR SURGERY                              You may not have any metal on your body including hair pins, jewelry, and body piercing             Do not wear make-up, lotions, powders, perfumes/cologne, or deodorant  Do not wear nail polish including gel and S&S, artificial/acrylic nails, or any other type of covering on natural nails including finger and toenails. If you have artificial nails, gel coating, etc. that needs to be removed by a nail salon please have this removed prior to surgery or surgery may need to be canceled/ delayed if the surgeon/ anesthesia feels like they are unable to be safely monitored.   Do not shave  48 hours prior to surgery.    Do not bring valuables to the hospital. [place].   Contacts, dentures or bridgework may not be worn into surgery.   Bring small overnight bag day of surgery.    Patients discharged on the day of surgery will not be allowed to drive home.  Someone needs to stay with you for the first 24 hours after anesthesia.   Special Instructions: Bring a copy of your healthcare power of attorney and living will documents         the day of surgery if you haven't scanned them before.              Please read over the following fact sheets you were given: IF YOU HAVE QUESTIONS ABOUT YOUR PRE-OP INSTRUCTIONS PLEASE CALL [phone]     [facility] - Preparing for Surgery Before surgery, you can play an important role.  Because skin is not sterile, your skin needs to be as free of germs as possible.  You can reduce the number of germs on your skin by washing with CHG (chlorahexidine gluconate) soap before surgery.  CHG is an antiseptic cleaner which kills germs and bonds with the skin to continue killing germs even after washing. Please DO NOT use if you have an allergy to CHG or antibacterial soaps.  If your skin becomes reddened/irritated stop using the CHG and  inform your nurse when you arrive at Short Stay. Do not shave (including legs and underarms) for at least 48 hours prior to the first CHG shower.  You may shave your face/neck. Please follow these instructions carefully:  1.  Shower with CHG Soap the night before surgery and the  morning of Surgery.  2.  If you choose to wash your hair, wash your hair first as usual with your  normal  shampoo.  3.  After you shampoo, rinse your hair and body thoroughly to remove the  shampoo.                           4.  Use CHG as you would any other liquid soap.  You can apply chg directly  to the skin and wash                       Gently with a scrungie or clean washcloth.  5.  Apply the CHG Soap to your body ONLY FROM  THE NECK DOWN.   Do not use on face/ open                           Wound or open sores. Avoid contact with eyes, ears mouth and genitals (private parts).                       Wash face,  Genitals (private parts) with your normal soap.             6.  Wash thoroughly, paying special attention to the area where your surgery  will be performed.  7.  Thoroughly rinse your body with warm water from the neck down.  8.  DO NOT shower/wash with your normal soap after using and rinsing off  the CHG Soap.                9.  Pat yourself dry with a clean towel.            10.  Wear clean pajamas.            11.  Place clean sheets on your bed the night of your first shower and do not  sleep with pets. Day of Surgery : Do not apply any lotions/deodorants the morning of surgery.  Please wear clean clothes to the hospital/surgery center.  FAILURE TO FOLLOW THESE INSTRUCTIONS MAY RESULT IN THE CANCELLATION OF YOUR SURGERY PATIENT SIGNATURE_________________________________  NURSE SIGNATURE__________________________________  ________________________________________________________________________    Adam Phenix  An incentive spirometer is a tool that can help keep your lungs clear and  active. This tool measures how well you are filling your lungs with each breath. Taking long deep breaths may help reverse or decrease the chance of developing breathing (pulmonary) problems (especially infection) following: A long period of time when you are unable to move or be active. BEFORE THE PROCEDURE  If the spirometer includes an indicator to show your best effort, your nurse or respiratory therapist will set it to a desired goal. If possible, sit up straight or lean slightly forward. Try not to slouch. Hold the incentive spirometer in an upright position. INSTRUCTIONS FOR USE  Sit on the edge of your bed if possible, or sit up as far as you can in bed or on a chair. Hold the incentive spirometer in an upright position. Breathe out normally. Place the mouthpiece in your mouth and seal your lips tightly around it. Breathe in slowly and as deeply as possible, raising the piston or the ball toward the top of the column. Hold your breath for 3-5 seconds or for as long as possible. Allow the piston or ball to fall to the bottom of the column. Remove the mouthpiece from your mouth and breathe out normally. Rest for a few seconds and repeat Steps 1 through 7 at least 10 times every 1-2 hours when you are awake. Take your time and take a few normal breaths between deep breaths. The spirometer may include an indicator to show your best effort. Use the indicator as a goal to work toward during each repetition. After each set of 10 deep breaths, practice coughing to be sure your lungs are clear. If you have an incision (the cut made at the time of surgery), support your incision when coughing by placing a pillow or rolled up towels firmly against it. Once you are able to get out of bed, walk around indoors and cough well. You  may stop using the incentive spirometer when instructed by your caregiver.  RISKS AND COMPLICATIONS Take your time so you do not get dizzy or light-headed. If you are in pain,  you may need to take or ask for pain medication before doing incentive spirometry. It is harder to take a deep breath if you are having pain. AFTER USE Rest and breathe slowly and easily. It can be helpful to keep track of a log of your progress. Your caregiver can provide you with a simple table to help with this. If you are using the spirometer at home, follow these instructions: [place] IF:  You are having difficultly using the spirometer. You have trouble using the spirometer as often as instructed. Your pain medication is not giving enough relief while using the spirometer. You develop fever of 100.5 F (38.1 C) or higher. SEEK IMMEDIATE MEDICAL CARE IF:  You cough up bloody sputum that had not been present before. You develop fever of 102 F (38.9 C) or greater. You develop worsening pain at or near the incision site. MAKE SURE YOU:  Understand these instructions. Will watch your condition. Will get help right away if you are not doing well or get worse. Document Released: 12/10/2006 Document Revised: 10/22/2011 Document Reviewed: 02/10/2007 [facility] Patient Information 2014 [place], [place].   ________________________________________________________________________

## 2021-09-14 ENCOUNTER — Encounter (HOSPITAL_COMMUNITY)
Admission: RE | Admit: 2021-09-14 | Discharge: 2021-09-14 | Disposition: A | Discharge status: Home / Self Care | Payer: Medicare PPO | Source: Ambulatory Visit | Attending: Orthopedic Surgery | Admitting: Orthopedic Surgery

## 2021-09-14 ENCOUNTER — Encounter (HOSPITAL_COMMUNITY): Payer: Self-pay

## 2021-09-14 ENCOUNTER — Other Ambulatory Visit: Payer: Self-pay

## 2021-09-14 VITALS — BP 129/104 | HR 66 | Temp 98.0°F | Resp 18 | Ht 61.0 in | Wt 140.0 lb

## 2021-09-14 DIAGNOSIS — G8929 Other chronic pain: Secondary | ICD-10-CM | POA: Diagnosis not present

## 2021-09-14 DIAGNOSIS — N189 Chronic kidney disease, unspecified: Secondary | ICD-10-CM | POA: Diagnosis not present

## 2021-09-14 DIAGNOSIS — M1712 Unilateral primary osteoarthritis, left knee: Secondary | ICD-10-CM | POA: Diagnosis not present

## 2021-09-14 DIAGNOSIS — M25562 Pain in left knee: Secondary | ICD-10-CM | POA: Insufficient documentation

## 2021-09-14 DIAGNOSIS — Z01812 Encounter for preprocedural laboratory examination: Secondary | ICD-10-CM | POA: Diagnosis present

## 2021-09-14 DIAGNOSIS — Z20822 Contact with and (suspected) exposure to covid-19: Secondary | ICD-10-CM | POA: Insufficient documentation

## 2021-09-14 DIAGNOSIS — Z01818 Encounter for other preprocedural examination: Secondary | ICD-10-CM

## 2021-09-14 DIAGNOSIS — I2510 Atherosclerotic heart disease of native coronary artery without angina pectoris: Secondary | ICD-10-CM | POA: Diagnosis not present

## 2021-09-14 DIAGNOSIS — I10 Essential (primary) hypertension: Secondary | ICD-10-CM

## 2021-09-14 DIAGNOSIS — I129 Hypertensive chronic kidney disease with stage 1 through stage 4 chronic kidney disease, or unspecified chronic kidney disease: Secondary | ICD-10-CM | POA: Insufficient documentation

## 2021-09-14 DIAGNOSIS — Z8673 Personal history of transient ischemic attack (TIA), and cerebral infarction without residual deficits: Secondary | ICD-10-CM | POA: Diagnosis not present

## 2021-09-14 DIAGNOSIS — D649 Anemia, unspecified: Secondary | ICD-10-CM | POA: Diagnosis not present

## 2021-09-14 DIAGNOSIS — G4733 Obstructive sleep apnea (adult) (pediatric): Secondary | ICD-10-CM | POA: Insufficient documentation

## 2021-09-14 HISTORY — DX: Urinary tract infection, site not specified: N39.0

## 2021-09-14 LAB — CBC WITH DIFFERENTIAL/PLATELET
Abs Immature Granulocytes: 0.01 10*3/uL (ref 0.00–0.07)
Basophils Absolute: 0 10*3/uL (ref 0.0–0.1)
Basophils Relative: 0 %
Eosinophils Absolute: 0.3 10*3/uL (ref 0.0–0.5)
Eosinophils Relative: 6 %
HCT: 36.6 % (ref 36.0–46.0)
Hemoglobin: 11.9 g/dL — ABNORMAL LOW (ref 12.0–15.0)
Immature Granulocytes: 0 %
Lymphocytes Relative: 21 %
Lymphs Abs: 1.1 10*3/uL (ref 0.7–4.0)
MCH: 31.4 pg (ref 26.0–34.0)
MCHC: 32.5 g/dL (ref 30.0–36.0)
MCV: 96.6 fL (ref 80.0–100.0)
Monocytes Absolute: 0.7 10*3/uL (ref 0.1–1.0)
Monocytes Relative: 13 %
Neutro Abs: 3 10*3/uL (ref 1.7–7.7)
Neutrophils Relative %: 60 %
Platelets: 144 10*3/uL — ABNORMAL LOW (ref 150–400)
RBC: 3.79 MIL/uL — ABNORMAL LOW (ref 3.87–5.11)
RDW: 14.1 % (ref 11.5–15.5)
WBC: 5.1 10*3/uL (ref 4.0–10.5)
nRBC: 0 % (ref 0.0–0.2)

## 2021-09-14 LAB — COMPREHENSIVE METABOLIC PANEL
ALT: 18 U/L (ref 0–44)
AST: 26 U/L (ref 15–41)
Albumin: 3.7 g/dL (ref 3.5–5.0)
Alkaline Phosphatase: 52 U/L (ref 38–126)
Anion gap: 8 (ref 5–15)
BUN: 37 mg/dL — ABNORMAL HIGH (ref 8–23)
CO2: 25 mmol/L (ref 22–32)
Calcium: 9.9 mg/dL (ref 8.9–10.3)
Chloride: 104 mmol/L (ref 98–111)
Creatinine, Ser: 1.01 mg/dL — ABNORMAL HIGH (ref 0.44–1.00)
GFR, Estimated: 54 mL/min — ABNORMAL LOW (ref >60)
Glucose, Bld: 99 mg/dL (ref 70–99)
Potassium: 3.9 mmol/L (ref 3.5–5.1)
Sodium: 137 mmol/L (ref 135–145)
Total Bilirubin: 0.6 mg/dL (ref 0.3–1.2)
Total Protein: 6.1 g/dL — ABNORMAL LOW (ref 6.5–8.1)

## 2021-09-14 LAB — SURGICAL PCR SCREEN
MRSA, PCR: NEGATIVE
Staphylococcus aureus: NEGATIVE

## 2021-09-14 LAB — SARS CORONAVIRUS 2 (TAT 6-24 HRS): SARS Coronavirus 2: NEGATIVE

## 2021-09-14 NOTE — Progress Notes (Signed)
COVID Vaccine Completed: Yes Date COVID Vaccine completed: 2021 x 2 COVID vaccine manufacturer:   Moderna     COVID Test: 09/14/21 Bowel prep reminder: N/A  PCP - Owens Shark: NP Cardiologist - Dr. Jenne Campus. Clearance: Rod Can NP: 07/31/21  Chest x-ray -  EKG -06/27/21  Stress Test -  ECHO - 07/26/21 Cardiac Cath -  Pacemaker/ICD device last checked:  Sleep Study -  CPAP -   Fasting Blood Sugar -  Checks Blood Sugar _____ times a day  Blood Thinner Instructions: Aspirin 325 mg. On hold since: 09/10/21 Aspirin Instructions: Dr. Ronnie Derby Last Dose:  Anesthesia review: Hx: CVA,OSA(NO CPAP),HTN.  Patient denies shortness of breath, fever, cough and chest pain at PAT appointment   Patient verbalized understanding of instructions that were given to them at the PAT appointment. Patient was also instructed that they will need to review over the PAT instructions again at home before surgery.

## 2021-09-14 NOTE — Progress Notes (Addendum)
Anesthesia Chart Review:   Case: [id number] Date/Time: 09/18/21 0855   Procedure: TOTAL KNEE ARTHROPLASTY (Left: Knee)   Anesthesia type: Spinal   Pre-op diagnosis: Osteoarthritis of left knee M17.12   Location: [facility] ROOM 07 / [facility]   Surgeons: Vickey Huger, MD       DISCUSSION: Pt is 88 years old with hx CAD (coronary calcifications on imaging), HTN, CVA, OSA, CKD, anemia  VS: BP (!) 129/104    Pulse 66    Temp 36.7 C (Oral)    Resp 18    Ht 5\' 1"  (1.549 m)    Wt 63.5 kg    SpO2 100%    BMI 26.45 kg/m   PROVIDERS: - PCP is Mayer Camel, NP - Cardiologist is Jenne Campus, MD. Last office visit 06/27/21.  Cleared for surgery by Coletta Memos, NP at acceptable risk on 07/31/21   LABS: Labs reviewed: Acceptable for surgery. - A1c 05/02/21 was 6.1, indicating prediabetes (care everywhere)   (all labs ordered are listed, but only abnormal results are displayed)  Labs Reviewed  CBC WITH DIFFERENTIAL/PLATELET - Abnormal; Notable for the following components:      Result Value   RBC 3.79 (*)    Hemoglobin 11.9 (*)    Platelets 144 (*)    All other components within normal limits  COMPREHENSIVE METABOLIC PANEL - Abnormal; Notable for the following components:   BUN 37 (*)    Creatinine, Ser 1.01 (*)    Total Protein 6.1 (*)    GFR, Estimated 54 (*)    All other components within normal limits  SURGICAL PCR SCREEN    EKG 06/27/21: Sinus rhythm with PACs. LAD. RBBB.    CV: Echo 07/26/21:  1. Left ventricular ejection fraction, by estimation, is 60 to 65%. The left ventricle has normal function. The left ventricle has no regional wall motion abnormalities. Left ventricular diastolic parameters are consistent with Grade I diastolic  dysfunction (impaired relaxation).  2. Right ventricular systolic function is normal. The right ventricular size is normal. There is normal pulmonary artery systolic pressure.  3. Unchanged in size as compared to echo from 2021.  Moderate pericardial effusion. The pericardial effusion is circumferential. There is no evidence of cardiac tamponade.  4. The mitral valve is normal in structure. No evidence of mitral valve regurgitation. No evidence of mitral stenosis.  5. The aortic valve is normal in structure. There is mild calcification of the aortic valve. There is mild thickening of the aortic valve. Aortic valve regurgitation is mild. No aortic stenosis is present.  6. The inferior vena cava is normal in size with greater than 50% respiratory variability, suggesting right atrial pressure of 3 mmHg.   Nuclear stress test 06/28/21:  Findings are consistent with no  ischemia and no prior myocardial infarction. The study is low risk. No ST deviation was noted. Left ventricular function is normal. Nuclear stress EF: 71 %. The left ventricular ejection fraction is hyperdynamic (>65%). End diastolic cavity size is normal. Prior study not available for comparison.   Cardiac monitor 09/20/20:  - Multiple episode of supraventricular tachycardia, none of this appears to be atrial fibrillation.  There are some episodes showing multifocal atrial tachycardia.   Past Medical History:  Diagnosis Date   Abdominal pain 06/29/2019   Anemia 07/01/2020   Anxiety    Arthritis    Cancer ([place])    skin   Chronic pain of left knee 81/27/5170   Complication of anesthesia    Coronary  artery disease involving native coronary artery of native heart without angina pectoris 05/04/2015   No symptoms, calcification noted the CT   Cyst of ovary 06/29/2019   Dyspnea on exertion 01/28/2019   GERD (gastroesophageal reflux disease)    occ   History of GI bleed 05/04/2015   History of stroke 08/25/2020   Formatting of this note might be different from the original. Left side of pons   HTN (hypertension) 04/21/2013   Hypertension    Late effect of cerebrovascular accident (CVA) 08/31/2020   Malaise and fatigue 06/30/2020   Obstructive sleep  apnea syndrome 06/30/2020   Pericardial effusion 05/04/2015   Moderate by last echocardiogram   Pes anserinus bursitis of right knee 12/29/2019   PONV (postoperative nausea and vomiting)    Recurrent UTI 09/28/2020   S/P lumbar laminectomy 02/17/2015   Stroke ([place]) 08/13/2020   Swelling of both lower extremities 01/28/2019   Trochanteric bursitis of right hip 12/29/2019   UTI (urinary tract infection)     Past Surgical History:  Procedure Laterality Date   ANKLE FRACTURE SURGERY Right    25 yrs ago   APPENDECTOMY     BREAST SURGERY Left 2011   removed nodule-benign   FOOT SURGERY Right    bunion 20 yrs ago   KNEE ARTHROSCOPY Left 2000   LAPAROTOMY  50   intestinal tortion   LUMBAR LAMINECTOMY/DECOMPRESSION MICRODISCECTOMY N/A 06/04/2013   Procedure: LUMBAR LAMINECTOMY/DECOMPRESSION MICRODISCECTOMY 1 LEVEL;  Surgeon: Eustace Moore, MD;  Location: [facility];  Service: Neurosurgery;  Laterality: N/A;  LUMBAR LAMINECTOMY/DECOMPRESSION MICRODISCECTOMY 1 LEVEL   LUMBAR LAMINECTOMY/DECOMPRESSION MICRODISCECTOMY Left 02/17/2015   Procedure: LUMBAR LAMINECTOMY/DECOMPRESSION MICRODISCECTOMY LUMBAR THREE FOUR;  Surgeon: Eustace Moore, MD;  Location: [place] [facility] OR[facility];  Service: Neurosurgery;  Laterality: Left;    MEDICATIONS:  acetaminophen (TYLENOL) 500 MG tablet   amLODipine (NORVASC) 5 MG tablet   aspirin EC 325 MG tablet   atorvastatin (LIPITOR) 40 MG tablet   Cholecalciferol (VITAMIN D-3) 125 MCG (5000 UT) TABS   cyanocobalamin (,VITAMIN B-12,) 1000 MCG/ML injection   escitalopram (LEXAPRO) 10 MG tablet   furosemide (LASIX) 40 MG tablet   labetalol (NORMODYNE) 200 MG tablet   losartan (COZAAR) 100 MG tablet   pantoprazole (PROTONIX) 40 MG tablet   Pediatric Multivitamins-Iron (FLINTSTONES PLUS IRON PO)   No current facility-administered medications for this encounter.    If no changes, I anticipate pt can proceed with surgery as scheduled.   Willeen Cass, PhD, FNP-BC [facility]  Short Stay Surgical Center/Anesthesiology Phone: [phone] 09/14/2021 1:36 PM

## 2021-09-14 NOTE — Anesthesia Preprocedure Evaluation (Addendum)
Anesthesia Evaluation  Patient identified by MRN, date of birth, ID band Patient awake    Reviewed: Allergy & Precautions, NPO status , Patient's Chart, lab work & pertinent test results  History of Anesthesia Complications (+) PONV and history of anesthetic complications  Airway Mallampati: II  TM Distance: >3 FB Neck ROM: Full    Dental  (+) Dental Advisory Given, Teeth Intact   Pulmonary sleep apnea ,    breath sounds clear to auscultation       Cardiovascular hypertension, Pt. on medications and Pt. on home beta blockers + CAD   Rhythm:Regular Rate:Normal  Echo: Left ventricular ejection fraction, by estimation, is 60 to 65%. The left ventricle has normal function. The left ventricle has no regional wall motion abnormalities. Left ventricular diastolic parameters are consistent with Grade I diastolic dysfunction (impaired relaxation). 1. Right ventricular systolic function is normal. The right ventricular size is normal. There is normal pulmonary artery systolic pressure. 2. Unchanged in size as compared to echo from 2021. Moderate pericardial effusion. The pericardial effusion is circumferential. There is no evidence of cardiac tamponade. 3. The mitral valve is normal in structure. No evidence of mitral valve regurgitation. No evidence of mitral stenosis. 4. The aortic valve is normal in structure. There is mild calcification of the aortic valve. There is mild thickening of the aortic valve. Aortic valve regurgitation is mild. No aortic stenosis is present. 5. The inferior vena cava is normal in size with greater than 50% respiratory variability, suggesting right atrial pressure of 3 mmHg.   Neuro/Psych Anxiety CVA    GI/Hepatic Neg liver ROS, GERD  Medicated,  Endo/Other  negative endocrine ROS  Renal/GU Renal disease     Musculoskeletal  (+) Arthritis ,   Abdominal Normal abdominal exam  (+)   Peds   Hematology negative hematology ROS (+)   Anesthesia Other Findings   Reproductive/Obstetrics                           Anesthesia Physical Anesthesia Plan  ASA: 3  Anesthesia Plan: Spinal   Post-op Pain Management: Regional block   Induction: Intravenous  PONV Risk Score and Plan: 4 or greater and Ondansetron and Propofol infusion  Airway Management Planned: Natural Airway and Simple Face Mask  Additional Equipment: None  Intra-op Plan:   Post-operative Plan:   Informed Consent: I have reviewed the patients History and Physical, chart, labs and discussed the procedure including the risks, benefits and alternatives for the proposed anesthesia with the patient or authorized representative who has indicated his/her understanding and acceptance.       Plan Discussed with: CRNA  Anesthesia Plan Comments: (See APP note by Durel Salts, FNP   Lab Results      Component                Value               Date                      WBC                      5.1                 09/14/2021                HGB  11.9 (L)            09/14/2021                HCT                      36.6                09/14/2021                MCV                      96.6                09/14/2021                PLT                      144 (L)             09/14/2021           )      Anesthesia Quick Evaluation

## 2021-09-18 ENCOUNTER — Encounter (HOSPITAL_COMMUNITY): Payer: Self-pay | Admitting: Orthopedic Surgery

## 2021-09-18 ENCOUNTER — Encounter (HOSPITAL_COMMUNITY)
Admission: AD | Disposition: A | Discharge status: Skilled Nursing Facility | Payer: Self-pay | Source: Home / Self Care | Attending: Orthopedic Surgery

## 2021-09-18 ENCOUNTER — Inpatient Hospital Stay (HOSPITAL_COMMUNITY)
Admission: AD | Admit: 2021-09-18 | Discharge: 2021-09-22 | DRG: 470 | Disposition: A | Discharge status: Skilled Nursing Facility | Payer: Medicare PPO | Attending: Orthopedic Surgery | Admitting: Orthopedic Surgery

## 2021-09-18 ENCOUNTER — Ambulatory Visit (HOSPITAL_COMMUNITY): Payer: Medicare PPO | Admitting: Anesthesiology

## 2021-09-18 ENCOUNTER — Ambulatory Visit (HOSPITAL_COMMUNITY): Payer: Medicare PPO | Admitting: Emergency Medicine

## 2021-09-18 ENCOUNTER — Other Ambulatory Visit: Payer: Self-pay

## 2021-09-18 DIAGNOSIS — N1832 Chronic kidney disease, stage 3b: Secondary | ICD-10-CM | POA: Diagnosis present

## 2021-09-18 DIAGNOSIS — Z20822 Contact with and (suspected) exposure to covid-19: Secondary | ICD-10-CM | POA: Diagnosis present

## 2021-09-18 DIAGNOSIS — G4733 Obstructive sleep apnea (adult) (pediatric): Secondary | ICD-10-CM | POA: Diagnosis present

## 2021-09-18 DIAGNOSIS — Z87440 Personal history of urinary (tract) infections: Secondary | ICD-10-CM

## 2021-09-18 DIAGNOSIS — I10 Essential (primary) hypertension: Secondary | ICD-10-CM

## 2021-09-18 DIAGNOSIS — Z885 Allergy status to narcotic agent status: Secondary | ICD-10-CM

## 2021-09-18 DIAGNOSIS — Z882 Allergy status to sulfonamides status: Secondary | ICD-10-CM

## 2021-09-18 DIAGNOSIS — M1712 Unilateral primary osteoarthritis, left knee: Principal | ICD-10-CM | POA: Diagnosis present

## 2021-09-18 DIAGNOSIS — K219 Gastro-esophageal reflux disease without esophagitis: Secondary | ICD-10-CM | POA: Diagnosis present

## 2021-09-18 DIAGNOSIS — Z8673 Personal history of transient ischemic attack (TIA), and cerebral infarction without residual deficits: Secondary | ICD-10-CM

## 2021-09-18 DIAGNOSIS — I129 Hypertensive chronic kidney disease with stage 1 through stage 4 chronic kidney disease, or unspecified chronic kidney disease: Secondary | ICD-10-CM | POA: Diagnosis present

## 2021-09-18 DIAGNOSIS — E1122 Type 2 diabetes mellitus with diabetic chronic kidney disease: Secondary | ICD-10-CM | POA: Diagnosis present

## 2021-09-18 DIAGNOSIS — Z79899 Other long term (current) drug therapy: Secondary | ICD-10-CM

## 2021-09-18 DIAGNOSIS — Z833 Family history of diabetes mellitus: Secondary | ICD-10-CM

## 2021-09-18 DIAGNOSIS — I2510 Atherosclerotic heart disease of native coronary artery without angina pectoris: Secondary | ICD-10-CM | POA: Diagnosis present

## 2021-09-18 DIAGNOSIS — Z96659 Presence of unspecified artificial knee joint: Secondary | ICD-10-CM

## 2021-09-18 DIAGNOSIS — Z880 Allergy status to penicillin: Secondary | ICD-10-CM

## 2021-09-18 DIAGNOSIS — Z96652 Presence of left artificial knee joint: Secondary | ICD-10-CM

## 2021-09-18 DIAGNOSIS — Z7982 Long term (current) use of aspirin: Secondary | ICD-10-CM

## 2021-09-18 DIAGNOSIS — Z888 Allergy status to other drugs, medicaments and biological substances status: Secondary | ICD-10-CM

## 2021-09-18 HISTORY — PX: TOTAL KNEE ARTHROPLASTY: SHX125

## 2021-09-18 SURGERY — ARTHROPLASTY, KNEE, TOTAL
Anesthesia: Spinal | Site: Knee | Laterality: Left

## 2021-09-18 MED ORDER — GABAPENTIN 300 MG PO CAPS
300.0000 mg | ORAL_CAPSULE | Freq: Once | ORAL | Status: AC
  Administered 2021-09-18: 300 mg via ORAL
  Filled 2021-09-18: qty 1

## 2021-09-18 MED ORDER — ATORVASTATIN CALCIUM 40 MG PO TABS
40.0000 mg | ORAL_TABLET | Freq: Every day | ORAL | Status: DC
  Administered 2021-09-19 – 2021-09-22 (×4): 40 mg via ORAL
  Filled 2021-09-18 (×4): qty 1

## 2021-09-18 MED ORDER — ZOLPIDEM TARTRATE 5 MG PO TABS: 5.0000 mg | ORAL_TABLET | Freq: Every evening | ORAL | Status: DC | PRN

## 2021-09-18 MED ORDER — SODIUM CHLORIDE 0.9 % IV SOLN: INTRAVENOUS | Status: DC

## 2021-09-18 MED ORDER — DIPHENHYDRAMINE HCL 12.5 MG/5ML PO ELIX: 12.5000 mg | ORAL_SOLUTION | ORAL | Status: DC | PRN

## 2021-09-18 MED ORDER — TRANEXAMIC ACID-NACL 1000-0.7 MG/100ML-% IV SOLN
1000.0000 mg | INTRAVENOUS | Status: AC
  Administered 2021-09-18: 1000 mg via INTRAVENOUS
  Filled 2021-09-18: qty 100

## 2021-09-18 MED ORDER — PHENYLEPHRINE HCL-NACL 20-0.9 MG/250ML-% IV SOLN
INTRAVENOUS | Status: DC | PRN
  Administered 2021-09-18: 60 ug/min via INTRAVENOUS

## 2021-09-18 MED ORDER — ACETAMINOPHEN 500 MG PO TABS
1000.0000 mg | ORAL_TABLET | Freq: Four times a day (QID) | ORAL | Status: AC
  Administered 2021-09-18 – 2021-09-19 (×2): 1000 mg via ORAL
  Filled 2021-09-18 (×2): qty 2

## 2021-09-18 MED ORDER — METOCLOPRAMIDE HCL 5 MG/ML IJ SOLN: 5.0000 mg | Freq: Three times a day (TID) | INTRAMUSCULAR | Status: DC | PRN

## 2021-09-18 MED ORDER — ACETAMINOPHEN 160 MG/5ML PO SOLN: 325.0000 mg | Freq: Once | ORAL | Status: DC | PRN

## 2021-09-18 MED ORDER — DOCUSATE SODIUM 100 MG PO CAPS
100.0000 mg | ORAL_CAPSULE | Freq: Two times a day (BID) | ORAL | Status: DC
  Administered 2021-09-19 – 2021-09-22 (×8): 100 mg via ORAL
  Filled 2021-09-18 (×8): qty 1

## 2021-09-18 MED ORDER — PHENOL 1.4 % MT LIQD: 1.0000 | OROMUCOSAL | Status: DC | PRN

## 2021-09-18 MED ORDER — LACTATED RINGERS IV SOLN: INTRAVENOUS | Status: DC

## 2021-09-18 MED ORDER — PANTOPRAZOLE SODIUM 40 MG PO TBEC
40.0000 mg | DELAYED_RELEASE_TABLET | Freq: Every morning | ORAL | Status: DC
Start: 2021-09-19 — End: 2021-09-23
  Administered 2021-09-19 – 2021-09-22 (×4): 40 mg via ORAL
  Filled 2021-09-18 (×4): qty 1

## 2021-09-18 MED ORDER — CHLORHEXIDINE GLUCONATE 0.12 % MT SOLN
15.0000 mL | Freq: Once | OROMUCOSAL | Status: AC
  Administered 2021-09-18: 15 mL via OROMUCOSAL

## 2021-09-18 MED ORDER — AMISULPRIDE (ANTIEMETIC) 5 MG/2ML IV SOLN: 10.0000 mg | Freq: Once | INTRAVENOUS | Status: DC | PRN

## 2021-09-18 MED ORDER — ACETAMINOPHEN 650 MG RE SUPP
650.0000 mg | RECTAL | Status: DC | PRN
  Administered 2021-09-18: 650 mg via RECTAL
  Filled 2021-09-18: qty 1

## 2021-09-18 MED ORDER — ONDANSETRON HCL 4 MG PO TABS: 4.0000 mg | ORAL_TABLET | Freq: Four times a day (QID) | ORAL | Status: DC | PRN

## 2021-09-18 MED ORDER — OXYCODONE HCL 5 MG PO TABS
5.0000 mg | ORAL_TABLET | ORAL | Status: DC | PRN
  Filled 2021-09-18: qty 1

## 2021-09-18 MED ORDER — DEXAMETHASONE SODIUM PHOSPHATE 10 MG/ML IJ SOLN: 8.0000 mg | Freq: Once | INTRAMUSCULAR | Status: DC

## 2021-09-18 MED ORDER — ONDANSETRON HCL 4 MG/2ML IJ SOLN
4.0000 mg | Freq: Four times a day (QID) | INTRAMUSCULAR | Status: DC | PRN
  Administered 2021-09-21: 4 mg via INTRAVENOUS
  Filled 2021-09-18: qty 2

## 2021-09-18 MED ORDER — SODIUM CHLORIDE 0.9% FLUSH
INTRAVENOUS | Status: DC | PRN
  Administered 2021-09-18: 20 mL

## 2021-09-18 MED ORDER — DEXAMETHASONE SODIUM PHOSPHATE 10 MG/ML IJ SOLN
10.0000 mg | Freq: Once | INTRAMUSCULAR | Status: AC
  Administered 2021-09-19: 10 mg via INTRAVENOUS
  Filled 2021-09-18: qty 1

## 2021-09-18 MED ORDER — ESCITALOPRAM OXALATE 10 MG PO TABS
10.0000 mg | ORAL_TABLET | Freq: Every day | ORAL | Status: DC
  Administered 2021-09-19 – 2021-09-22 (×4): 10 mg via ORAL
  Filled 2021-09-18 (×4): qty 1

## 2021-09-18 MED ORDER — FERROUS SULFATE 325 (65 FE) MG PO TABS
325.0000 mg | ORAL_TABLET | Freq: Three times a day (TID) | ORAL | Status: DC
  Administered 2021-09-19 – 2021-09-22 (×11): 325 mg via ORAL
  Filled 2021-09-18 (×12): qty 1

## 2021-09-18 MED ORDER — FLEET ENEMA 7-19 GM/118ML RE ENEM: 1.0000 | ENEMA | Freq: Once | RECTAL | Status: DC | PRN

## 2021-09-18 MED ORDER — ALUM & MAG HYDROXIDE-SIMETH 200-200-20 MG/5ML PO SUSP: 30.0000 mL | ORAL | Status: DC | PRN

## 2021-09-18 MED ORDER — ROPIVACAINE HCL 5 MG/ML IJ SOLN
INTRAMUSCULAR | Status: DC | PRN
Start: 2021-09-18 — End: 2021-09-18
  Administered 2021-09-18: 20 mL via PERINEURAL

## 2021-09-18 MED ORDER — AMLODIPINE BESYLATE 5 MG PO TABS
5.0000 mg | ORAL_TABLET | Freq: Every morning | ORAL | Status: DC
  Administered 2021-09-19 – 2021-09-22 (×4): 5 mg via ORAL
  Filled 2021-09-18 (×4): qty 1

## 2021-09-18 MED ORDER — ACETAMINOPHEN 10 MG/ML IV SOLN: 1000.0000 mg | Freq: Once | INTRAVENOUS | Status: DC | PRN

## 2021-09-18 MED ORDER — SENNOSIDES-DOCUSATE SODIUM 8.6-50 MG PO TABS
1.0000 | ORAL_TABLET | Freq: Every evening | ORAL | Status: DC | PRN
  Administered 2021-09-22: 1 via ORAL
  Filled 2021-09-18: qty 1

## 2021-09-18 MED ORDER — FENTANYL CITRATE PF 50 MCG/ML IJ SOSY: 25.0000 ug | PREFILLED_SYRINGE | INTRAMUSCULAR | Status: DC | PRN

## 2021-09-18 MED ORDER — ORAL CARE MOUTH RINSE: 15.0000 mL | Freq: Once | OROMUCOSAL | Status: AC

## 2021-09-18 MED ORDER — ACETAMINOPHEN 325 MG PO TABS: 325.0000 mg | ORAL_TABLET | Freq: Once | ORAL | Status: DC | PRN

## 2021-09-18 MED ORDER — BUPIVACAINE LIPOSOME 1.3 % IJ SUSP
INTRAMUSCULAR | Status: DC | PRN
  Administered 2021-09-18: 20 mL

## 2021-09-18 MED ORDER — MENTHOL 3 MG MT LOZG: 1.0000 | LOZENGE | OROMUCOSAL | Status: DC | PRN

## 2021-09-18 MED ORDER — METHOCARBAMOL 500 MG IVPB - SIMPLE MED
500.0000 mg | Freq: Four times a day (QID) | INTRAVENOUS | Status: DC | PRN
  Filled 2021-09-18: qty 50

## 2021-09-18 MED ORDER — PROPOFOL 10 MG/ML IV BOLUS
INTRAVENOUS | Status: DC | PRN
  Administered 2021-09-18: 30 mg via INTRAVENOUS

## 2021-09-18 MED ORDER — METHOCARBAMOL 500 MG PO TABS
500.0000 mg | ORAL_TABLET | Freq: Four times a day (QID) | ORAL | Status: DC | PRN
  Administered 2021-09-18: 500 mg via ORAL
  Filled 2021-09-18: qty 1

## 2021-09-18 MED ORDER — WATER FOR IRRIGATION, STERILE IR SOLN
Status: DC | PRN
  Administered 2021-09-18: 2000 mL

## 2021-09-18 MED ORDER — CEFAZOLIN SODIUM-DEXTROSE 2-4 GM/100ML-% IV SOLN
2.0000 g | INTRAVENOUS | Status: AC
  Administered 2021-09-18: 2 g via INTRAVENOUS
  Filled 2021-09-18: qty 100

## 2021-09-18 MED ORDER — TRANEXAMIC ACID-NACL 1000-0.7 MG/100ML-% IV SOLN
1000.0000 mg | Freq: Once | INTRAVENOUS | Status: AC
  Administered 2021-09-18: 1000 mg via INTRAVENOUS
  Filled 2021-09-18: qty 100

## 2021-09-18 MED ORDER — POVIDONE-IODINE 10 % EX SWAB
2.0000 "application " | Freq: Once | CUTANEOUS | Status: AC
  Administered 2021-09-18: 2 via TOPICAL

## 2021-09-18 MED ORDER — HYDROMORPHONE HCL 1 MG/ML IJ SOLN: 0.5000 mg | INTRAMUSCULAR | Status: DC | PRN

## 2021-09-18 MED ORDER — BUPIVACAINE IN DEXTROSE 0.75-8.25 % IT SOLN
INTRATHECAL | Status: DC | PRN
  Administered 2021-09-18: 1.4 mL via INTRATHECAL

## 2021-09-18 MED ORDER — ACETAMINOPHEN 500 MG PO TABS
1000.0000 mg | ORAL_TABLET | Freq: Once | ORAL | Status: DC
  Filled 2021-09-18: qty 2

## 2021-09-18 MED ORDER — BISACODYL 5 MG PO TBEC: 5.0000 mg | DELAYED_RELEASE_TABLET | Freq: Every day | ORAL | Status: DC | PRN

## 2021-09-18 MED ORDER — LABETALOL HCL 100 MG PO TABS
200.0000 mg | ORAL_TABLET | Freq: Two times a day (BID) | ORAL | Status: DC
  Administered 2021-09-19 – 2021-09-22 (×8): 200 mg via ORAL
  Filled 2021-09-18 (×8): qty 2

## 2021-09-18 MED ORDER — BUPIVACAINE LIPOSOME 1.3 % IJ SUSP
INTRAMUSCULAR | Status: AC
  Filled 2021-09-18: qty 20

## 2021-09-18 MED ORDER — ASPIRIN EC 325 MG PO TBEC
325.0000 mg | DELAYED_RELEASE_TABLET | Freq: Two times a day (BID) | ORAL | Status: DC
  Administered 2021-09-19 – 2021-09-22 (×7): 325 mg via ORAL
  Filled 2021-09-18 (×8): qty 1

## 2021-09-18 MED ORDER — BUPIVACAINE-EPINEPHRINE 0.25% -1:200000 IJ SOLN
INTRAMUSCULAR | Status: DC | PRN
  Administered 2021-09-18: 30 mL

## 2021-09-18 MED ORDER — FUROSEMIDE 40 MG PO TABS
40.0000 mg | ORAL_TABLET | Freq: Every morning | ORAL | Status: DC
  Administered 2021-09-19 – 2021-09-22 (×4): 40 mg via ORAL
  Filled 2021-09-18 (×4): qty 1

## 2021-09-18 MED ORDER — SODIUM CHLORIDE (PF) 0.9 % IJ SOLN
INTRAMUSCULAR | Status: AC
  Filled 2021-09-18: qty 20

## 2021-09-18 MED ORDER — PROPOFOL 500 MG/50ML IV EMUL
INTRAVENOUS | Status: DC | PRN
  Administered 2021-09-18: 40 ug/kg/min via INTRAVENOUS

## 2021-09-18 MED ORDER — FENTANYL CITRATE PF 50 MCG/ML IJ SOSY
50.0000 ug | PREFILLED_SYRINGE | Freq: Once | INTRAMUSCULAR | Status: AC
  Administered 2021-09-18: 25 ug via INTRAVENOUS
  Filled 2021-09-18: qty 2

## 2021-09-18 MED ORDER — LACTATED RINGERS IV SOLN
INTRAVENOUS | Status: DC
  Administered 2021-09-18: 1000 mL via INTRAVENOUS

## 2021-09-18 MED ORDER — METOCLOPRAMIDE HCL 5 MG PO TABS: 5.0000 mg | ORAL_TABLET | Freq: Three times a day (TID) | ORAL | Status: DC | PRN

## 2021-09-18 MED ORDER — SODIUM CHLORIDE 0.9 % IR SOLN
Status: DC | PRN
  Administered 2021-09-18 (×2): 1000 mL

## 2021-09-18 MED ORDER — BUPIVACAINE LIPOSOME 1.3 % IJ SUSP: 20.0000 mL | Freq: Once | INTRAMUSCULAR | Status: DC

## 2021-09-18 MED ORDER — TRAMADOL HCL 50 MG PO TABS
50.0000 mg | ORAL_TABLET | Freq: Four times a day (QID) | ORAL | Status: DC
  Administered 2021-09-18: 50 mg via ORAL
  Filled 2021-09-18 (×2): qty 1

## 2021-09-18 MED ORDER — PANTOPRAZOLE SODIUM 40 MG PO TBEC: 40.0000 mg | DELAYED_RELEASE_TABLET | Freq: Every day | ORAL | Status: DC

## 2021-09-18 MED ORDER — LOSARTAN POTASSIUM 50 MG PO TABS
100.0000 mg | ORAL_TABLET | Freq: Every day | ORAL | Status: DC
Start: 2021-09-18 — End: 2021-09-23
  Administered 2021-09-19 – 2021-09-22 (×4): 100 mg via ORAL
  Filled 2021-09-18 (×4): qty 2

## 2021-09-18 MED ORDER — BUPIVACAINE-EPINEPHRINE (PF) 0.25% -1:200000 IJ SOLN
INTRAMUSCULAR | Status: AC
  Filled 2021-09-18: qty 30

## 2021-09-18 SURGICAL SUPPLY — 61 items
ARTISURF 10M PLY L 6-9EF KNEE (Knees) ×1 IMPLANT
BAG COUNTER SPONGE SURGICOUNT (BAG) ×1 IMPLANT
BAG SPEC THK2 15X12 ZIP CLS (MISCELLANEOUS) ×1
BAG SPNG CNTER NS LX DISP (BAG) ×1
BAG ZIPLOCK 12X15 (MISCELLANEOUS) ×3 IMPLANT
BLADE SAGITTAL 13X1.27X60 (BLADE) ×3 IMPLANT
BLADE SAW SGTL 18X1.27X75 (BLADE) ×3 IMPLANT
BLADE SURG 15 STRL LF DISP TIS (BLADE) ×2 IMPLANT
BLADE SURG 15 STRL SS (BLADE) ×2
BLADE SURG SZ10 CARB STEEL (BLADE) ×6 IMPLANT
BNDG ELASTIC 6X5.8 VLCR STR LF (GAUZE/BANDAGES/DRESSINGS) ×3 IMPLANT
BOWL SMART MIX CTS (DISPOSABLE) ×3 IMPLANT
BSPLAT TIB 5D E CMNT STM LT (Knees) ×1 IMPLANT
CEMENT BONE SIMPLEX SPEEDSET (Cement) ×6 IMPLANT
COMP FEM PERSONA SZ7 LT (Joint) ×2 IMPLANT
COMPONENT FEM PERSONA SZ7 LT (Joint) IMPLANT
COVER SURGICAL LIGHT HANDLE (MISCELLANEOUS) ×3 IMPLANT
CUFF TOURN SGL QUICK 34 (TOURNIQUET CUFF) ×2
CUFF TRNQT CYL 34X4.125X (TOURNIQUET CUFF) ×2 IMPLANT
DRAPE INCISE IOBAN 66X45 STRL (DRAPES) ×8 IMPLANT
DRAPE U-SHAPE 47X51 STRL (DRAPES) ×3 IMPLANT
DRESSING MEPILEX FLEX 4X4 (GAUZE/BANDAGES/DRESSINGS) IMPLANT
DRSG AQUACEL AG ADV 3.5X10 (GAUZE/BANDAGES/DRESSINGS) ×3 IMPLANT
DRSG MEPILEX FLEX 4X4 (GAUZE/BANDAGES/DRESSINGS) ×2
DURAPREP 26ML APPLICATOR (WOUND CARE) ×6 IMPLANT
ELECT REM PT RETURN 15FT ADLT (MISCELLANEOUS) ×3 IMPLANT
GAUZE 4X4 16PLY ~~LOC~~+RFID DBL (SPONGE) IMPLANT
GLOVE SURG ENC TEXT LTX SZ7.5 (GLOVE) IMPLANT
GLOVE SURG ENC TEXT LTX SZ8 (GLOVE) ×9 IMPLANT
GLOVE SURG UNDER POLY LF SZ7.5 (GLOVE) IMPLANT
GLOVE SURG UNDER POLY LF SZ8.5 (GLOVE) ×6 IMPLANT
GOWN STRL REUS W/ TWL XL LVL3 (GOWN DISPOSABLE) ×4 IMPLANT
GOWN STRL REUS W/TWL XL LVL3 (GOWN DISPOSABLE) ×4
HANDPIECE INTERPULSE COAX TIP (DISPOSABLE) ×2
HOLDER FOLEY CATH W/STRAP (MISCELLANEOUS) ×3 IMPLANT
HOOD PEEL AWAY FLYTE STAYCOOL (MISCELLANEOUS) ×9 IMPLANT
KIT TURNOVER KIT A (KITS) IMPLANT
MANIFOLD NEPTUNE II (INSTRUMENTS) ×3 IMPLANT
NDL HYPO 21X1.5 SAFETY (NEEDLE) ×2 IMPLANT
NEEDLE HYPO 21X1.5 SAFETY (NEEDLE) ×2 IMPLANT
NS IRRIG 1000ML POUR BTL (IV SOLUTION) ×3 IMPLANT
PACK TOTAL KNEE CUSTOM (KITS) ×3 IMPLANT
PROTECTOR NERVE ULNAR (MISCELLANEOUS) ×3 IMPLANT
SET HNDPC FAN SPRY TIP SCT (DISPOSABLE) ×2 IMPLANT
SPIKE FLUID TRANSFER (MISCELLANEOUS) ×6 IMPLANT
SPONGE T-LAP 18X18 ~~LOC~~+RFID (SPONGE) ×1 IMPLANT
STEM POLY PAT PLY 32M KNEE (Knees) ×1 IMPLANT
STEM TIBIA 5 DEG SZ E L KNEE (Knees) IMPLANT
STRIP CLOSURE SKIN 1/2X4 (GAUZE/BANDAGES/DRESSINGS) ×5 IMPLANT
SUT BONE WAX W31G (SUTURE) ×3 IMPLANT
SUT MNCRL AB 3-0 PS2 18 (SUTURE) ×3 IMPLANT
SUT STRATAFIX 0 PDS 27 VIOLET (SUTURE) ×2
SUT STRATAFIX PDS+ 0 24IN (SUTURE) ×3 IMPLANT
SUT VIC AB 1 CT1 36 (SUTURE) ×3 IMPLANT
SUTURE STRATFX 0 PDS 27 VIOLET (SUTURE) ×2 IMPLANT
SYR 30ML LL (SYRINGE) ×6 IMPLANT
TIBIA STEM 5 DEG SZ E L KNEE (Knees) ×2 IMPLANT
TRAY FOLEY MTR SLVR 14FR STAT (SET/KITS/TRAYS/PACK) ×1 IMPLANT
TRAY FOLEY MTR SLVR 16FR STAT (SET/KITS/TRAYS/PACK) ×2 IMPLANT
WATER STERILE IRR 1000ML POUR (IV SOLUTION) ×6 IMPLANT
WRAP KNEE MAXI GEL POST OP (GAUZE/BANDAGES/DRESSINGS) ×3 IMPLANT

## 2021-09-18 NOTE — Evaluation (Signed)
Physical Therapy Evaluation Patient Details Name: Colleen Watkins MRN: [id number] DOB: [birth year] Today's Date: 09/18/2021  History of Present Illness  Colleen Watkins is an 88 yo female S/P L TKA on 09/18/2021. PMH: Stroke, HTN, Pericardial effusion, CKD, lumbar laminectomy, CAD.  Clinical Impression  The patient is very sleepy/lethargic. Aroused to mobilize to sitting onto EOB with max assistance. Stood x 2 at Rw with max assistance of 2 persons helping.   Patient will have 24/7 caregivers initially. Per son, family sharing night assistance "for a few days."  Anticipate that patient will require 24/7 assistance for  some time.   Recommend HHPT. Pt admitted with above diagnosis.  Pt currently with functional limitations due to the deficits listed below (see PT Problem List). Pt will benefit from skilled PT to increase their independence and safety with mobility to allow discharge to the venue listed below.        Recommendations for follow up therapy are one component of a multi-disciplinary discharge planning process, led by the attending physician.  Recommendations may be updated based on patient status, additional functional criteria and insurance authorization.  Follow Up Recommendations Home health PT    Assistance Recommended at Discharge Frequent or constant Supervision/Assistance  Patient can return home with the following  A lot of help with bathing/dressing/bathroom;Assistance with cooking/housework;Assist for transportation;A lot of help with walking and/or transfers;Help with stairs or ramp for entrance    Equipment Recommendations  (son would like a tub bench)  Recommendations for Other Services       Functional Status Assessment Patient has had a recent decline in their functional status and demonstrates the ability to make significant improvements in function in a reasonable and predictable amount of time.     Precautions / Restrictions Precautions Precautions: Fall;Knee       Mobility  Bed Mobility Overal bed mobility: Needs Assistance Bed Mobility: Supine to Sit, Sit to Supine     Supine to sit: Mod assist, HOB elevated Sit to supine: +2 for physical assistance, +2 for safety/equipment, Total assist   General bed mobility comments: patient assisting with the right leg and attempting to sit upright. Therapist used bed pad to scoot to bed edge. +2  total to return to supine.    Transfers Overall transfer level: Needs assistance Equipment used: Rolling walker (2 wheels) Transfers: Sit to/from Stand Sit to Stand: +2 physical assistance, +2 safety/equipment, Max assist           General transfer comment: stood x 2 from bed, unable to take a side step.    Ambulation/Gait                  Stairs            Wheelchair Mobility    Modified Rankin (Stroke Patients Only)       Balance Overall balance assessment: Needs assistance Sitting-balance support: Bilateral upper extremity supported, Feet supported Sitting balance-Leahy Scale: Poor Sitting balance - Comments: tending to lean forward, sleepy.   Standing balance support: Bilateral upper extremity supported, During functional activity, Reliant on assistive device for balance Standing balance-Leahy Scale: Poor                               Pertinent Vitals/Pain Pain Assessment Pain Assessment: Faces Faces Pain Scale: Hurts even more Pain Location: left knee Pain Descriptors / Indicators: Aching, Discomfort, Moaning Pain Intervention(s): Monitored during session, Premedicated before session, Limited activity within  patient's tolerance, Ice applied, Repositioned    Home Living Family/patient expects to be discharged to:: Private residence Living Arrangements: Children;Non-relatives/Friends Available Help at Discharge: Family;Available 24 hours/day Type of Home: House Home Access: Level entry       Home Layout: One level Home Equipment: Conservation officer, nature (2  wheels);BSC/3in1      Prior Function Prior Level of Function : Needs assist       Physical Assist : Mobility (physical);ADLs (physical)   ADLs (physical): Bathing;Grooming;Dressing Mobility Comments: has had caregivers fro 8 AM-5 PM, home alone at night ADLs Comments: helpers assist with  ADL's     Hand Dominance        Extremity/Trunk Assessment   Upper Extremity Assessment Upper Extremity Assessment: Generalized weakness    Lower Extremity Assessment Lower Extremity Assessment: LLE deficits/detail LLE Deficits / Details: tending to hold leg off bed and flex the knee, asking for a pillow under knee, explained why not    Cervical / Trunk Assessment Cervical / Trunk Assessment: Kyphotic  Communication   Communication: HOH  Cognition Arousal/Alertness: Lethargic, Suspect due to medications Behavior During Therapy: Flat affect Overall Cognitive Status: Within Functional Limits for tasks assessed                                 General Comments: able to follow directions with increased time. Oriwnted to surgery and date        General Comments      Exercises     Assessment/Plan    PT Assessment Patient needs continued PT services  PT Problem List Decreased strength;Decreased balance;Decreased knowledge of precautions;Decreased range of motion;Decreased mobility;Decreased knowledge of use of DME;Decreased activity tolerance;Decreased coordination;Decreased safety awareness;Pain       PT Treatment Interventions DME instruction;Therapeutic activities;Gait training;Therapeutic exercise;Patient/family education;Functional mobility training    PT Goals (Current goals can be found in the Care Plan section)  Acute Rehab PT Goals Patient Stated Goal: per son, to stand today. PT Goal Formulation: With patient/family Time For Goal Achievement: 09/25/21 Potential to Achieve Goals: Good    Frequency 7X/week     Co-evaluation                AM-PAC PT "6 Clicks" Mobility  Outcome Measure Help needed turning from your back to your side while in a flat bed without using bedrails?: Total Help needed moving from lying on your back to sitting on the side of a flat bed without using bedrails?: Total Help needed moving to and from a bed to a chair (including a wheelchair)?: Total Help needed standing up from a chair using your arms (e.g., wheelchair or bedside chair)?: Total Help needed to walk in hospital room?: Total Help needed climbing 3-5 steps with a railing? : Total 6 Click Score: 6    End of Session Equipment Utilized During Treatment: Gait belt Activity Tolerance: Patient limited by lethargy Patient left: in bed;with call bell/phone within reach;with bed alarm set Nurse Communication: Mobility status PT Visit Diagnosis: Unsteadiness on feet (R26.81);Difficulty in walking, not elsewhere classified (R26.2);Pain Pain - Right/Left: Left Pain - part of body: Knee    Time: 4696-2952 PT Time Calculation (min) (ACUTE ONLY): 29 min   Charges:   PT Evaluation $PT Eval Low Complexity: 1 Low PT Treatments $Therapeutic Activity: 8-22 mins        Tresa Endo PT Acute Rehabilitation Services Pager [phone] Office [phone]   Claretha Cooper 09/18/2021, 5:44  PM

## 2021-09-18 NOTE — H&P (Signed)
Colleen Watkins MRN:  [id number] DOB/SEX:  1933-06-27/female  CHIEF COMPLAINT:  Painful left Knee  HISTORY: Patient is a 88 y.o. female presented with a history of pain in the left knee. Onset of symptoms was gradual starting a few years ago with gradually worsening course since that time. Patient has been treated conservatively with over-the-counter NSAIDs and activity modification. Patient currently rates pain in the knee at 10 out of 10 with activity. There is pain at night.  PAST MEDICAL HISTORY: Patient Active Problem List   Diagnosis Date Noted   Preop cardiovascular exam 06/27/2021   Stage 3b chronic kidney disease ([place]) 05/02/2021   Chronic diarrhea 02/07/2021   PONV (postoperative nausea and vomiting)    Hypertension    GERD (gastroesophageal reflux disease)    Complication of anesthesia    Cancer ([place])    Anxiety    Arthritis    Recurrent UTI 09/28/2020   Late effect of cerebrovascular accident (CVA) 08/31/2020   History of stroke 08/25/2020   Stroke ([place]) 08/13/2020   Anemia 07/01/2020   Malaise and fatigue 06/30/2020   Obstructive sleep apnea syndrome 06/30/2020   Pes anserinus bursitis of right knee 12/29/2019   Trochanteric bursitis of right hip 12/29/2019   Abdominal pain 06/29/2019   Cyst of ovary 06/29/2019   Swelling of both lower extremities 01/28/2019   Dyspnea on exertion 01/28/2019   Chronic pain of left knee 01/21/2017   Coronary artery disease involving native coronary artery of native heart without angina pectoris 05/04/2015   Pericardial effusion 05/04/2015   History of GI bleed 05/04/2015   S/P lumbar laminectomy 02/17/2015   HTN (hypertension) 04/21/2013   Past Medical History:  Diagnosis Date   Abdominal pain 06/29/2019   Anemia 07/01/2020   Anxiety    Arthritis    Cancer ([place])    skin   Chronic pain of left knee 02/54/2706   Complication of anesthesia    Coronary artery disease involving native coronary artery of native heart without angina  pectoris 05/04/2015   No symptoms, calcification noted the CT   Cyst of ovary 06/29/2019   Dyspnea on exertion 01/28/2019   GERD (gastroesophageal reflux disease)    occ   History of GI bleed 05/04/2015   History of stroke 08/25/2020   Formatting of this note might be different from the original. Left side of pons   HTN (hypertension) 04/21/2013   Hypertension    Late effect of cerebrovascular accident (CVA) 08/31/2020   Malaise and fatigue 06/30/2020   Obstructive sleep apnea syndrome 06/30/2020   Pericardial effusion 05/04/2015   Moderate by last echocardiogram   Pes anserinus bursitis of right knee 12/29/2019   PONV (postoperative nausea and vomiting)    Recurrent UTI 09/28/2020   S/P lumbar laminectomy 02/17/2015   Stroke ([place]) 08/13/2020   Swelling of both lower extremities 01/28/2019   Trochanteric bursitis of right hip 12/29/2019   UTI (urinary tract infection)    Past Surgical History:  Procedure Laterality Date   ANKLE FRACTURE SURGERY Right    25 yrs ago   APPENDECTOMY     BREAST SURGERY Left 2011   removed nodule-benign   FOOT SURGERY Right    bunion 20 yrs ago   KNEE ARTHROSCOPY Left 2000   LAPAROTOMY  50   intestinal tortion   LUMBAR LAMINECTOMY/DECOMPRESSION MICRODISCECTOMY N/A 06/04/2013   Procedure: LUMBAR LAMINECTOMY/DECOMPRESSION MICRODISCECTOMY 1 LEVEL;  Surgeon: Eustace Moore, MD;  Location: [facility];  Service: Neurosurgery;  Laterality: N/A;  LUMBAR  LAMINECTOMY/DECOMPRESSION MICRODISCECTOMY 1 LEVEL   LUMBAR LAMINECTOMY/DECOMPRESSION MICRODISCECTOMY Left 02/17/2015   Procedure: LUMBAR LAMINECTOMY/DECOMPRESSION MICRODISCECTOMY LUMBAR THREE FOUR;  Surgeon: Eustace Moore, MD;  Location: [facility];  Service: Neurosurgery;  Laterality: Left;     MEDICATIONS:   Medications Prior to Admission  Medication Sig Dispense Refill Last Dose   acetaminophen (TYLENOL) 500 MG tablet Take 1,000 mg by mouth every 6 (six) hours as needed (for pain.).   09/18/2021    amLODipine (NORVASC) 5 MG tablet Take 5 mg by mouth in the morning.   09/18/2021   aspirin EC 325 MG tablet Take 325 mg by mouth in the morning.   Past Week   atorvastatin (LIPITOR) 40 MG tablet Take 40 mg by mouth at bedtime.   09/17/2021   Cholecalciferol (VITAMIN D-3) 125 MCG (5000 UT) TABS Take 5,000 Units by mouth in the morning.   Past Week   cyanocobalamin (,VITAMIN B-12,) 1000 MCG/ML injection Inject 1,000 mcg into the muscle every 30 (thirty) days.   Past Month   escitalopram (LEXAPRO) 10 MG tablet Take 10 mg by mouth at bedtime.   09/17/2021   furosemide (LASIX) 40 MG tablet Take 40 mg by mouth in the morning.   09/17/2021   labetalol (NORMODYNE) 200 MG tablet Take 200 mg by mouth 2 (two) times daily.   09/18/2021 at 0430   losartan (COZAAR) 100 MG tablet Take 100 mg by mouth at bedtime.   09/17/2021   pantoprazole (PROTONIX) 40 MG tablet Take 40 mg by mouth in the morning.   09/18/2021   Pediatric Multivitamins-Iron (FLINTSTONES PLUS IRON PO) Take 1 tablet by mouth in the morning.   Past Week    ALLERGIES:   Allergies  Allergen Reactions   Oxycodone Nausea Only   Penicillins Other (See Comments)    Lip/ face tingling   Codeine Rash   Hydrocortisone Nausea Only   Sulfa Antibiotics Rash    REVIEW OF SYSTEMS:  A comprehensive review of systems was negative except for: Musculoskeletal: positive for arthralgias and bone pain   FAMILY HISTORY:   Family History  Problem Relation Age of Onset   Breast cancer Daughter    Ovarian cancer Mother    Diabetes Sister     SOCIAL HISTORY:   Social History   Tobacco Use   Smoking status: Never   Smokeless tobacco: Never  Substance Use Topics   Alcohol use: No     EXAMINATION:  Vital signs in last 24 hours: Temp:  [98.1 F (36.7 C)] 98.1 F (36.7 C) (02/06 0645) Pulse Rate:  [65] 65 (02/06 0645) Resp:  [20] 20 (02/06 0645) BP: (138)/(63) 138/63 (02/06 0645) SpO2:  [96 %] 96 % (02/06 0645) Weight:  [63.5 kg] 63.5 kg (02/06  0645)  BP 138/63    Pulse 65    Temp 98.1 F (36.7 C) (Oral)    Resp 20    Ht 5\' 1"  (1.549 m)    Wt 63.5 kg    SpO2 96%    BMI 26.45 kg/m   General Appearance:    Alert, cooperative, no distress, appears stated age  Head:    Normocephalic, without obvious abnormality, atraumatic  Eyes:    PERRL, conjunctiva/corneas clear, EOM's intact, fundi    benign, both eyes  Ears:    Normal TM's and external ear canals, both ears  Nose:   Nares normal, septum midline, mucosa normal, no drainage    or sinus tenderness  Throat:   Lips, mucosa, and  tongue normal; teeth and gums normal  Neck:   Supple, symmetrical, trachea midline, no adenopathy;    thyroid:  no enlargement/tenderness/nodules; no carotid   bruit or JVD  Back:     Symmetric, no curvature, ROM normal, no CVA tenderness  Lungs:     Clear to auscultation bilaterally, respirations unlabored  Chest Wall:    No tenderness or deformity   Heart:    Regular rate and rhythm, S1 and S2 normal, no murmur, rub   or gallop  Breast Exam:    No tenderness, masses, or nipple abnormality  Abdomen:     Soft, non-tender, bowel sounds active all four quadrants,    no masses, no organomegaly  Genitalia:    Normal female without lesion, discharge or tenderness  Rectal:    Normal tone, no masses or tenderness;   guaiac negative stool  Extremities:   Extremities normal, atraumatic, no cyanosis or edema  Pulses:   2+ and symmetric all extremities  Skin:   Skin color, texture, turgor normal, no rashes or lesions  Lymph nodes:   Cervical, supraclavicular, and axillary nodes normal  Neurologic:   CNII-XII intact, normal strength, sensation and reflexes    throughout    Musculoskeletal:  ROM 0-120, Ligaments intact,  Imaging Review Plain radiographs demonstrate severe degenerative joint disease of the left knee. The overall alignment is neutral. The bone quality appears to be good for age and reported activity level.  Assessment/Plan: Primary  osteoarthritis, left knee   The patient history, physical examination and imaging studies are consistent with advanced degenerative joint disease of the left knee. The patient has failed conservative treatment.  The clearance notes were reviewed.  After discussion with the patient it was felt that Total Knee Replacement was indicated. The procedure,  risks, and benefits of total knee arthroplasty were presented and reviewed. The risks including but not limited to aseptic loosening, infection, blood clots, vascular injury, stiffness, patella tracking problems complications among others were discussed. The patient acknowledged the explanation, agreed to proceed with the plan.  Preoperative templating of the joint replacement has been completed, documented, and submitted to the Operating Room personnel in order to optimize intra-operative equipment management.    Patient's anticipated LOS is less than 2 midnights, meeting these requirements: - Lives within 1 hour of care - Has a competent adult at home to recover with post-op recover - NO history of  - Chronic pain requiring opiods  - Diabetes  - Coronary Artery Disease  - Heart failure  - Heart attack  - Stroke  - DVT/VTE  - Cardiac arrhythmia  - Respiratory Failure/COPD  - Renal failure  - Anemia  - Advanced Liver disease     Donia Ast 09/18/2021, 7:15 AM

## 2021-09-18 NOTE — Anesthesia Procedure Notes (Signed)
Anesthesia Regional Block: Adductor canal block   Pre-Anesthetic Checklist: , timeout performed,  Correct Patient, Correct Site, Correct Laterality,  Correct Procedure, Correct Position, site marked,  Risks and benefits discussed,  Surgical consent,  Pre-op evaluation,  At surgeon's request and post-op pain management  Laterality: Left  Prep: chloraprep       Needles:  Injection technique: Single-shot  Needle Type: Echogenic Stimulator Needle     Needle Length: 9cm  Needle Gauge: 21     Additional Needles:   Procedures:,,,, ultrasound used (permanent image in chart),,    Narrative:  Start time: 09/18/2021 8:45 AM End time: 09/18/2021 8:50 AM Injection made incrementally with aspirations every 5 mL.  Performed by: Personally  Anesthesiologist: [name], MD  Additional Notes: Patient tolerated the procedure well. Local anesthetic introduced in an incremental fashion under minimal resistance after negative aspirations. No paresthesias were elicited. After completion of the procedure, no acute issues were identified and patient continued to be monitored by RN.

## 2021-09-18 NOTE — Progress Notes (Signed)
Orthopedic Tech Progress Note Patient Details:  Colleen Watkins [birth year] [id number]  CPM Left Knee CPM Left Knee: On Left Knee Flexion (Degrees): 90 Left Knee Extension (Degrees): 0 Additional Comments: zero knee  Post Interventions Patient Tolerated: Well Instructions Provided: Care of device, Adjustment of device  [name] 09/18/2021, 11:33 AM

## 2021-09-18 NOTE — Transfer of Care (Signed)
Immediate Anesthesia Transfer of Care Note  Patient: Colleen Watkins  Procedure(s) Performed: TOTAL KNEE ARTHROPLASTY (Left: Knee)  Patient Location: PACU  Anesthesia Type:Spinal  Level of Consciousness: awake, alert  and drowsy  Airway & Oxygen Therapy: Patient Spontanous Breathing and Patient connected to face mask oxygen  Post-op Assessment: Report given to RN and Post -op Vital signs reviewed and stable  Post vital signs: Reviewed and stable  Last Vitals:  Vitals Value Taken Time  BP 104/54 09/18/21 1107  Temp    Pulse 59 09/18/21 1109  Resp 15 09/18/21 1109  SpO2 99 % 09/18/21 1109  Vitals shown include unvalidated device data.  Last Pain:  Vitals:   09/18/21 0903  TempSrc:   PainSc: 0-No pain         Complications: No notable events documented.

## 2021-09-18 NOTE — Plan of Care (Signed)
  Problem: Activity: Goal: Risk for activity intolerance will decrease Outcome: Progressing   Problem: Pain Managment: Goal: General experience of comfort will improve Outcome: Progressing   Problem: Safety: Goal: Ability to remain free from injury will improve Outcome: Progressing   

## 2021-09-18 NOTE — Op Note (Signed)
TOTAL KNEE REPLACEMENT OPERATIVE NOTE:  09/18/2021  11:13 AM  PATIENT:  Colleen Watkins  88 y.o. female  PRE-OPERATIVE DIAGNOSIS:  Osteoarthritis of left knee M17.12  POST-OPERATIVE DIAGNOSIS:  Osteoarthritis of left knee M17.12  PROCEDURE:  Procedure(s): TOTAL KNEE ARTHROPLASTY  SURGEON:  Surgeon(s): Vickey Huger, MD  PHYSICIAN ASSISTANT: Carlyon Shadow, PA-C   ANESTHESIA:   spinal  SPECIMEN: None  COUNTS:  Correct  TOURNIQUET:   Total Tourniquet Time Documented: Thigh (Left) - 35 minutes Total: Thigh (Left) - 35 minutes   DICTATION:  Indication for procedure:    The patient is a 88 y.o. female who has failed conservative treatment for Osteoarthritis of left knee M17.12.  Informed consent was obtained prior to anesthesia. The risks versus benefits of the operation were explain and in a way the patient can, and did, understand.     Description of procedure:     The patient was taken to the operating room and placed under anesthesia.  The patient was positioned in the usual fashion taking care that all body parts were adequately padded and/or protected.  A tourniquet was applied and the leg prepped and draped in the usual sterile fashion.  The extremity was exsanguinated with the esmarch and tourniquet inflated to 250 mmHg.  Pre-operative range of motion was normal.  T  A midline incision approximately 6-7 inches long was made with a #10 blade.  A new blade was used to make a parapatellar arthrotomy going 2-3 cm into the quadriceps tendon, over the patella, and alongside the medial aspect of the patellar tendon.  A synovectomy was then performed with the #10 blade and forceps. I then elevated the deep MCL off the medial tibial metaphysis subperiosteally around to the semimembranosus attachment.    I everted the patella and used calipers to measure patellar thickness.  I used the reamer to ream down to appropriate thickness to recreate the native thickness.  I then removed excess  bone with the rongeur and sagittal saw.  I used the appropriately sized template and drilled the three lug holes.  I then put the trial in place and measured the thickness with the calipers to ensure recreation of the native thickness.  The trial was then removed and the patella subluxed and the knee brought into flexion.  A homan retractor was place to retract and protect the patella and lateral structures.  A Z-retractor was place medially to protect the medial structures.  The extra-medullary alignment system was used to make cut the tibial articular surface perpendicular to the anamotic axis of the tibia and in 3 degrees of posterior slope.  The cut surface and alignment jig was removed.  I then used the intramedullary alignment guide to make a valgus cut on the distal femur.  I then marked out the epicondylar axis on the distal femur.  I then used the anterior referencing sizer and measured the femur to be a size 7.  The 4-In-1 cutting block was screwed into place in external rotation matching the posterior condylar angle, making our cuts perpendicular to the epicondylar axis.  Anterior, posterior and chamfer cuts were made with the sagittal saw.  The cutting block and cut pieces were removed.  A lamina spreader was placed in 90 degrees of flexion.  The ACL, PCL, menisci, and posterior condylar osteophytes were removed.  A 10 mm spacer blocked was found to offer good flexion and extension gap balance after minimal in degree releasing.   The scoop retractor was then placed  and the femoral finishing block was pinned in place.  The small sagittal saw was used as well as the lug drill to finish the femur.  The block and cut surfaces were removed and the medullary canal hole filled with autograft bone from the cut pieces.  The tibia was delivered forward in deep flexion and external rotation.  A size E tray was selected and pinned into place centered on the medial 1/3 of the tibial tubercle.  The reamer and  keel was used to prepare the tibia through the tray.    I then trialed with the size 7 femur, size E tibia, a 10 mm insert and the 32 patella.  I had excellent flexion/extension gap balance, excellent patella tracking.  Flexion was full and beyond 120 degrees; extension was zero.  These components were chosen and the staff opened them to me on the back table while the knee was lavaged copiously and the cement mixed.  The soft tissue was infiltrated with 60cc of exparel 1.3% through a 21 gauge needle.  I cemented in the components and removed all excess cement.  The polyethylene tibial component was snapped into place and the knee placed in extension while cement was hardening.  The capsule was infilltrated with a 60cc exparel/marcaine/saline mixture.   Once the cement was hard, the tourniquet was let down.  Hemostasis was obtained.  The arthrotomy was closed using a #1 stratofix running suture.  The deep soft tissues were closed with #0 vicryls and the subcuticular layer closed with #2-0 vicryl.  The skin was reapproximated and closed with 3.0 Monocryl.  The wound was covered with steristrips, aquacel dressing, and a TED stocking.   The patient was then awakened, extubated, and taken to the recovery room in stable condition.  BLOOD LOSS:  830NM COMPLICATIONS:  None.  PLAN OF CARE: Admit for overnight observation  PATIENT DISPOSITION:  PACU - hemodynamically stable.   Delay start of Pharmacological VTE agent (>24hrs) due to surgical blood loss or risk of bleeding:  yes  Please fax a copy of this op note to my office at [phone] (please only include page 1 and 2 of the Case Information op note)

## 2021-09-18 NOTE — Progress Notes (Signed)
Assisted Dr. Hollis with left, ultrasound guided, adductor canal block. Side rails up, monitors on throughout procedure. See vital signs in flow sheet. Tolerated Procedure well.  

## 2021-09-18 NOTE — Anesthesia Procedure Notes (Signed)
Spinal  Start time: 09/18/2021 9:30 AM End time: 09/18/2021 9:35 AM Reason for block: surgical anesthesia Staffing Performed: anesthesiologist  Anesthesiologist: [name], MD Preanesthetic Checklist Completed: patient identified, IV checked, site marked, risks and benefits discussed, surgical consent, monitors and equipment checked, pre-op evaluation and timeout performed Spinal Block Patient position: sitting Prep: DuraPrep and site prepped and draped Location: L3-4 Injection technique: single-shot Needle Needle type: Pencan  Needle gauge: 24 G Needle length: 10 cm Needle insertion depth: 10 cm Additional Notes Patient tolerated well. No immediate complications. 2 attempts  Functioning IV was confirmed and monitors were applied. Sterile prep and drape, including hand hygiene and sterile gloves were used. The patient was positioned and the back was prepped. The skin was anesthetized with lidocaine. Free flow of clear CSF was obtained prior to injecting local anesthetic into the CSF. The spinal needle aspirated freely following injection. The needle was carefully withdrawn. The patient tolerated the procedure well.

## 2021-09-18 NOTE — Anesthesia Postprocedure Evaluation (Signed)
Anesthesia Post Note  Patient: Colleen Watkins  Procedure(s) Performed: TOTAL KNEE ARTHROPLASTY (Left: Knee)     Patient location during evaluation: PACU Anesthesia Type: Spinal Level of consciousness: oriented and awake and alert Pain management: pain level controlled Vital Signs Assessment: post-procedure vital signs reviewed and stable Respiratory status: spontaneous breathing, respiratory function stable and patient connected to nasal cannula oxygen Cardiovascular status: blood pressure returned to baseline and stable Postop Assessment: no headache, no backache and no apparent nausea or vomiting Anesthetic complications: no   No notable events documented.  Last Vitals:  Vitals:   09/18/21 1300 09/18/21 1311  BP: (!) 145/85 (!) 156/71  Pulse: 73 69  Resp: 17 16  Temp:  (!) 36.4 C  SpO2: 94% 97%    Last Pain:  Vitals:   09/18/21 1311  TempSrc: Oral  PainSc: Asleep                 [name]

## 2021-09-19 ENCOUNTER — Encounter (HOSPITAL_COMMUNITY): Payer: Self-pay | Admitting: Orthopedic Surgery

## 2021-09-19 LAB — CBC
HCT: 35.2 % — ABNORMAL LOW (ref 36.0–46.0)
Hemoglobin: 11.2 g/dL — ABNORMAL LOW (ref 12.0–15.0)
MCH: 30.8 pg (ref 26.0–34.0)
MCHC: 31.8 g/dL (ref 30.0–36.0)
MCV: 96.7 fL (ref 80.0–100.0)
Platelets: 122 10*3/uL — ABNORMAL LOW (ref 150–400)
RBC: 3.64 MIL/uL — ABNORMAL LOW (ref 3.87–5.11)
RDW: 14 % (ref 11.5–15.5)
WBC: 8.5 10*3/uL (ref 4.0–10.5)
nRBC: 0 % (ref 0.0–0.2)

## 2021-09-19 LAB — BASIC METABOLIC PANEL
Anion gap: 7 (ref 5–15)
BUN: 21 mg/dL (ref 8–23)
CO2: 23 mmol/L (ref 22–32)
Calcium: 9.3 mg/dL (ref 8.9–10.3)
Chloride: 109 mmol/L (ref 98–111)
Creatinine, Ser: 0.76 mg/dL (ref 0.44–1.00)
GFR, Estimated: >60 mL/min (ref >60)
Glucose, Bld: 125 mg/dL — ABNORMAL HIGH (ref 70–99)
Potassium: 3.9 mmol/L (ref 3.5–5.1)
Sodium: 139 mmol/L (ref 135–145)

## 2021-09-19 MED ORDER — ACETAMINOPHEN 500 MG PO TABS
1000.0000 mg | ORAL_TABLET | Freq: Four times a day (QID) | ORAL | Status: DC | PRN
  Administered 2021-09-19 – 2021-09-22 (×9): 1000 mg via ORAL
  Filled 2021-09-19 (×9): qty 2

## 2021-09-19 MED ORDER — CHLORHEXIDINE GLUCONATE CLOTH 2 % EX PADS
6.0000 | MEDICATED_PAD | Freq: Every day | CUTANEOUS | Status: DC
  Administered 2021-09-19 – 2021-09-20 (×2): 6 via TOPICAL

## 2021-09-19 NOTE — Plan of Care (Signed)
  Problem: Coping: Goal: Level of anxiety will decrease Outcome: Progressing   Problem: Pain Managment: Goal: General experience of comfort will improve Outcome: Progressing   Problem: Safety: Goal: Ability to remain free from injury will improve Outcome: Progressing   

## 2021-09-19 NOTE — Progress Notes (Signed)
PT Cancellation Note  Patient Details Name: Colleen Watkins MRN: [id number] DOB: [birth year]   Cancelled Treatment:    Reason Eval/Treat Not Completed: Medical issues which prohibited therapy, will hold therapy today due to lethargy.     Claretha Cooper 09/19/2021, 4:53 PM [place] Pager [phone] Office [phone]

## 2021-09-19 NOTE — TOC Transition Note (Signed)
Transition of Care [facility]) - CM/SW Discharge Note  Patient Details  Name: Colleen Watkins MRN: [id number] Date of Birth: 1933/05/23  [facility] CM/SW Contact:  Sherie Don, LCSW Phone Number: 09/19/2021, 12:47 PM  Clinical Narrative: Patient is expected to discharge home after working with PT. CSW met with patient and her daughter, Butch Penny. Patient was lethargic, so CSW discussed discharge plan with daughter. Per daughter, she could not remember if the patient was going home with OPPT or HHPT as patient was discharged from [facility] about 2 weeks. Patient has a CPM, walker (not rolling), cane, and elevated toilets at home so there are no DME needs at this time. CSW confirmed with Loma Sousa in Dr. Ruel Favors office that patient is set up with OPPT at [facility] on 09/28/21 at 1pm. CSW updated patient's son, Cherlynn Kaiser, who is now here with the patient. TOC signing off.  Final next level of care: OP Rehab Barriers to Discharge: No Barriers Identified  Patient Goals and CMS Choice Patient states their goals for this hospitalization and ongoing recovery are:: Discharge home with OPPT Choice offered to / list presented to : NA  Discharge Plan and Services         DME Arranged: N/A DME Agency: NA  Readmission Risk Interventions No flowsheet data found.

## 2021-09-19 NOTE — Progress Notes (Signed)
Family request foley catheter not be removed until patient is more alert

## 2021-09-19 NOTE — Progress Notes (Signed)
SPORTS MEDICINE AND JOINT REPLACEMENT  Lara Mulch, MD    Carlyon Shadow, PA-C [address], [place], [place]  [address]                             [phone]   PROGRESS NOTE  Subjective:  negative for Chest Pain  negative for Shortness of Breath  negative for Nausea/Vomiting   negative for Calf Pain  negative for Bowel Movement   Tolerating Diet: yes         Patient reports pain as 3 on 0-10 scale.    Objective: Vital signs in last 24 hours:   Patient Vitals for the past 24 hrs:  BP Temp Temp src Pulse Resp SpO2  09/19/21 1000 (!) 176/86 98.9 F (37.2 C) Oral 93 -- 97 %  09/19/21 0623 (!) 177/74 99.8 F (37.7 C) Oral 95 19 96 %  09/19/21 0202 (!) 163/70 (!) 100.4 F (38 C) Oral 99 20 96 %  09/18/21 2342 (!) 171/77 98.7 F (37.1 C) Oral (!) 105 20 96 %  09/18/21 2120 (!) 169/73 (!) 101.9 F (38.8 C) Axillary (!) 107 20 97 %  09/18/21 1522 134/60 (!) 97.4 F (36.3 C) Oral 63 16 98 %  09/18/21 1311 (!) 156/71 (!) 97.5 F (36.4 C) Oral 69 16 97 %  09/18/21 1300 (!) 145/85 -- -- 73 17 94 %  09/18/21 1245 (!) 159/67 -- -- 63 (!) 23 98 %    @flow {1959:LAST@   Intake/Output from previous day:   02/06 0701 - 02/07 0700 In: 2462.5 [P.O.:320; I.V.:1942.5] Out: 1925 [Urine:1825]   Intake/Output this shift:   No intake/output data recorded.   Intake/Output      02/06 0701 02/07 0700 02/07 0701 02/08 0700   P.O. 320    I.V. (mL/kg) 1942.5 (30.6)    IV Piggyback 200    Total Intake(mL/kg) 2462.5 (38.8)    Urine (mL/kg/hr) 1825 (1.2)    Blood 100    Total Output 1925    Net +537.5            LABORATORY DATA: Recent Labs    09/14/21 0947 09/19/21 0342  WBC 5.1 8.5  HGB 11.9* 11.2*  HCT 36.6 35.2*  PLT 144* 122*   Recent Labs    09/14/21 0947 09/19/21 0342  NA 137 139  K 3.9 3.9  CL 104 109  CO2 25 23  BUN 37* 21  CREATININE 1.01* 0.76  GLUCOSE 99 125*  CALCIUM 9.9 9.3   No results found for: INR, PROTIME  Examination:  General  appearance: alert and fatigued Extremities: extremities normal, atraumatic, no cyanosis or edema  Wound Exam: clean, dry, intact   Drainage:  None: wound tissue dry  Motor Exam: Quadriceps and Hamstrings Intact  Sensory Exam: Deep Peroneal and Tibial normal   Assessment:    1 Day Post-Op  Procedure(s) (LRB): TOTAL KNEE ARTHROPLASTY (Left)  ADDITIONAL DIAGNOSIS:  Principal Problem:   S/P total knee replacement     Plan: Physical Therapy as ordered Weight Bearing as Tolerated (WBAT)  DVT Prophylaxis:  Aspirin  DISCHARGE PLAN: Home  Patient extremely sensitive to medications and has remained sedated and sleeping today. Recommend home meds and tylenol/aspirin only. Will keep foley in over night and recheck tomorrow. PT as able.        Patient's anticipated LOS is less than 2 midnights, meeting these requirements: - Lives within 1 hour of care -  Has a competent adult at home to recover with post-op recover - NO history of  - Chronic pain requiring opiods  - Diabetes  - Coronary Artery Disease  - Heart failure  - Heart attack  - Stroke  - DVT/VTE  - Cardiac arrhythmia  - Respiratory Failure/COPD  - Renal failure  - Anemia  - Advanced Liver disease      Donia Ast 09/19/2021, 12:32 PM

## 2021-09-19 NOTE — Plan of Care (Signed)
  Problem: Pain Managment: Goal: General experience of comfort will improve Outcome: Progressing   Problem: Safety: Goal: Ability to remain free from injury will improve Outcome: Progressing   

## 2021-09-19 NOTE — Progress Notes (Signed)
Orthopedic Tech Progress Note Patient Details:  Colleen Watkins April 06, 1933 [id number]      Post Interventions Patient Tolerated: Well Instructions Provided: Care of device, Adjustment of device  [name] [name] 09/19/2021, 3:30 PM Applied CPM to left knee. Patient could not tolerate 60 extension. Changed to 40.

## 2021-09-19 NOTE — Progress Notes (Signed)
Orthopedic Tech Progress Note Patient Details:  Colleen Watkins [birth year] [id number]  CPM Left Knee CPM Left Knee: Off Left Knee Flexion (Degrees): 0 Left Knee Extension (Degrees): 40 Additional Comments: CPM off at Patients request  Post Interventions Patient Tolerated: Well Instructions Provided: Care of device, Adjustment of device  [name] Jazz Biddy 09/19/2021, 5:18 PM Removed CPM device and set aside in room for future use. Patient requested and nurse called me.

## 2021-09-20 DIAGNOSIS — I2510 Atherosclerotic heart disease of native coronary artery without angina pectoris: Secondary | ICD-10-CM | POA: Diagnosis present

## 2021-09-20 DIAGNOSIS — Z20822 Contact with and (suspected) exposure to covid-19: Secondary | ICD-10-CM | POA: Diagnosis present

## 2021-09-20 DIAGNOSIS — Z96652 Presence of left artificial knee joint: Secondary | ICD-10-CM

## 2021-09-20 DIAGNOSIS — E1122 Type 2 diabetes mellitus with diabetic chronic kidney disease: Secondary | ICD-10-CM | POA: Diagnosis present

## 2021-09-20 DIAGNOSIS — Z8673 Personal history of transient ischemic attack (TIA), and cerebral infarction without residual deficits: Secondary | ICD-10-CM | POA: Diagnosis not present

## 2021-09-20 DIAGNOSIS — Z880 Allergy status to penicillin: Secondary | ICD-10-CM | POA: Diagnosis not present

## 2021-09-20 DIAGNOSIS — Z885 Allergy status to narcotic agent status: Secondary | ICD-10-CM | POA: Diagnosis not present

## 2021-09-20 DIAGNOSIS — K219 Gastro-esophageal reflux disease without esophagitis: Secondary | ICD-10-CM | POA: Diagnosis present

## 2021-09-20 DIAGNOSIS — M25562 Pain in left knee: Secondary | ICD-10-CM | POA: Diagnosis present

## 2021-09-20 DIAGNOSIS — M1712 Unilateral primary osteoarthritis, left knee: Secondary | ICD-10-CM | POA: Diagnosis present

## 2021-09-20 DIAGNOSIS — Z833 Family history of diabetes mellitus: Secondary | ICD-10-CM | POA: Diagnosis not present

## 2021-09-20 DIAGNOSIS — I129 Hypertensive chronic kidney disease with stage 1 through stage 4 chronic kidney disease, or unspecified chronic kidney disease: Secondary | ICD-10-CM | POA: Diagnosis present

## 2021-09-20 DIAGNOSIS — Z79899 Other long term (current) drug therapy: Secondary | ICD-10-CM | POA: Diagnosis not present

## 2021-09-20 DIAGNOSIS — Z87440 Personal history of urinary (tract) infections: Secondary | ICD-10-CM | POA: Diagnosis not present

## 2021-09-20 DIAGNOSIS — Z882 Allergy status to sulfonamides status: Secondary | ICD-10-CM | POA: Diagnosis not present

## 2021-09-20 DIAGNOSIS — Z7982 Long term (current) use of aspirin: Secondary | ICD-10-CM | POA: Diagnosis not present

## 2021-09-20 DIAGNOSIS — G4733 Obstructive sleep apnea (adult) (pediatric): Secondary | ICD-10-CM | POA: Diagnosis present

## 2021-09-20 DIAGNOSIS — Z888 Allergy status to other drugs, medicaments and biological substances status: Secondary | ICD-10-CM | POA: Diagnosis not present

## 2021-09-20 DIAGNOSIS — N1832 Chronic kidney disease, stage 3b: Secondary | ICD-10-CM | POA: Diagnosis present

## 2021-09-20 HISTORY — DX: Presence of left artificial knee joint: Z96.652

## 2021-09-20 LAB — CBC
HCT: 30.7 % — ABNORMAL LOW (ref 36.0–46.0)
Hemoglobin: 9.9 g/dL — ABNORMAL LOW (ref 12.0–15.0)
MCH: 31.3 pg (ref 26.0–34.0)
MCHC: 32.2 g/dL (ref 30.0–36.0)
MCV: 97.2 fL (ref 80.0–100.0)
Platelets: 112 10*3/uL — ABNORMAL LOW (ref 150–400)
RBC: 3.16 MIL/uL — ABNORMAL LOW (ref 3.87–5.11)
RDW: 14.1 % (ref 11.5–15.5)
WBC: 9.3 10*3/uL (ref 4.0–10.5)
nRBC: 0 % (ref 0.0–0.2)

## 2021-09-20 NOTE — Progress Notes (Signed)
Orthopedic Tech Progress Note Patient Details:  Colleen Watkins 1933/02/17 [id number]  Patient ID: Colleen Watkins, female   DOB: [birth year], 88 y.o.   MRN: [id number]  Colleen Watkins 09/20/2021, 5:25 PM Adjusted cpm to 0 to 40 as patient was complaining of pain.

## 2021-09-20 NOTE — Plan of Care (Signed)
?  Problem: Activity: ?Goal: Risk for activity intolerance will decrease ?Outcome: Progressing ?  ?Problem: Safety: ?Goal: Ability to remain free from injury will improve ?Outcome: Progressing ?  ?Problem: Pain Managment: ?Goal: General experience of comfort will improve ?Outcome: Progressing ?  ?

## 2021-09-20 NOTE — Progress Notes (Signed)
Orthopedic Tech Progress Note Patient Details:  Colleen Watkins 1933/06/10 [id number]  CPM Left Knee CPM Left Knee: Off Left Knee Flexion (Degrees): 0 Left Knee Extension (Degrees): 50 Additional Comments: Cpm off at 800  today  Post Interventions Patient Tolerated: Well Instructions Provided: Care of device, Adjustment of device  [name] [name] 09/20/2021, 7:07 PM Took patient out of CPM.  It was adjusted by nurse to 0 - 40 per the patient.

## 2021-09-20 NOTE — Progress Notes (Signed)
Physical Therapy Treatment Patient Details Name: Colleen Watkins MRN: [id number] DOB: [birth year] Today's Date: 09/20/2021   History of Present Illness Colleen Watkins is an 88 yo female S/P L TKA on 09/18/2021. PMH: Stroke, HTN, Pericardial effusion, CKD, lumbar laminectomy, CAD. Post op lethargy    PT Comments    The patient required  assist of 2 to ambulate x 15' to BR. Patient too fatigued to ambulate again so recliner brought to BR and was assisted back to recliner, then +2 mod to pivot back to bed.   Patient's daughter present and expresses concern that there will be only 1 caregiver at a time and some are not able to physically assist the patient. Patient can benefit from short rehab stay.  Recommendations for follow up therapy are one component of a multi-disciplinary discharge planning process, led by the attending physician.  Recommendations may be updated based on patient status, additional functional criteria and insurance authorization.  Follow Up Recommendations  SNF or Home health PT if has progressed to requiring 1 min assist.     Assistance Recommended at Discharge Frequent or constant Supervision/Assistance  Patient can return home with the following A lot of help with bathing/dressing/bathroom;Assistance with cooking/housework;Assist for transportation;A lot of help with walking and/or transfers;Help with stairs or ramp for entrance;Direct supervision/assist for medications management   Equipment Recommendations  Wheelchair (measurements PT);Wheelchair cushion (measurements PT)    Recommendations for Other Services       Precautions / Restrictions Precautions Precautions: Fall;Knee Restrictions LLE Weight Bearing: Weight bearing as tolerated     Mobility  Bed Mobility Overal bed mobility: Needs Assistance Bed Mobility: Sit to Supine     Sd Sit to supine: Max assist, +2 for safety/equipment   General bed mobility comments: assist  for legs and trunk back onto bed.     Transfers Overall transfer level: Needs assistance Equipment used: Rolling walker (2 wheels) Transfers: Sit to/from Stand, Bed to chair/wheelchair/BSC Sit to Stand: Mod assist, +2 safety/equipment, Max assist   Step pivot transfers: Max assist from toilet to recliner then to bed.       General transfer comment: + 2 mod Assist to power up  to stand from recliner, max of 2 from toilet, patient very weak after ambulating 15' to toilet. After standing from toilet, unable  to  ambulate. Recliner brought up and assisted to pivot to recliner while in BR.    Ambulation/Gait Ambulation/Gait assistance: Mod assist, +2 safety/equipment, Max assist Gait Distance (Feet): 15 Feet Assistive device: Rolling walker (2 wheels) Gait Pattern/deviations: Step-to pattern, Step-through pattern Gait velocity: decr     General Gait Details: Initially able to advance  each leg and step through( right foot turned out) and required mod assist to steady. Patient became weaker and  began to shuffle with  decreased step length requiring max assistance of 2 to continue steps to  to get to toilet and turn to sit down.   Stairs             Wheelchair Mobility    Modified Rankin (Stroke Patients Only)       Balance Overall balance assessment: Needs assistance Sitting-balance support: No upper extremity supported Sitting balance-Leahy Scale: Fair Sitting balance - Comments: tending to lean forward, kyphosis   Standing balance support: Reliant on assistive device for balance, Bilateral upper extremity supported, During functional activity Standing balance-Leahy Scale: Poor Standing balance comment: relies on therapist support for balance.  Cognition Arousal/Alertness: Awake/alert Behavior During Therapy: WFL for tasks assessed/performed Overall Cognitive Status: Within Functional Limits for tasks assessed                                  General Comments: except off a day. Knows surgery was Monday        Exercises Total Joint Exercises Ankle Circles/Pumps: AROM, Both, 10 reps, Supine Heel Slides: AAROM, AROM, Both, 10 reps, Supine Straight Leg Raises: AAROM, Both, 10 reps, Supine Goniometric ROM: 10-60 left knee flex    General Comments        Pertinent Vitals/Pain Pain Assessment Pain Score: 8  Faces Pain Scale: Hurts even more Pain Location: left knee Pain Descriptors / Indicators: Aching, Discomfort Pain Intervention(s): Monitored during session, Premedicated before session, Repositioned, Ice applied    Home Living                          Prior Function            PT Goals (current goals can now be found in the care plan section) Progress towards PT goals: Progressing toward goals    Frequency    7X/week      PT Plan Current plan remains appropriate    Co-evaluation              AM-PAC PT "6 Clicks" Mobility   Outcome Measure  Help needed turning from your back to your side while in a flat bed without using bedrails?: A Lot Help needed moving from lying on your back to sitting on the side of a flat bed without using bedrails?: A Lot Help needed moving to and from a bed to a chair (including a wheelchair)?: A Lot Help needed standing up from a chair using your arms (e.g., wheelchair or bedside chair)?: A Lot Help needed to walk in hospital room?: Total Help needed climbing 3-5 steps with a railing? : Total 6 Click Score: 10    End of Session Equipment Utilized During Treatment: Gait belt Activity Tolerance: Patient limited by fatigue Patient left: in bed;with call bell/phone within reach;with bed alarm set;with family/visitor present Nurse Communication: Mobility status PT Visit Diagnosis: Unsteadiness on feet (R26.81);Difficulty in walking, not elsewhere classified (R26.2);Pain Pain - Right/Left: Left Pain - part of body: Knee     Time: 4765-4650 PT Time  Calculation (min) (ACUTE ONLY): 29 min  Charges:  $Gait Training: 23-37 mins                    [place] Pager [phone] Office [phone]   Claretha Cooper 09/20/2021, 1:34 PM

## 2021-09-20 NOTE — Progress Notes (Addendum)
Physical Therapy Treatment Patient Details Name: Colleen Watkins MRN: [id number] DOB: 11/30/1932 Today's Date: 09/20/2021   History of Present Illness Colleen Watkins is an 88 yo female S/P L TKA on 09/18/2021. PMH: Stroke, HTN, Pericardial effusion, CKD, lumbar laminectomy, CAD. Post op lethargy    PT Comments    The patient is awake today and able to participate in therapy. Patient required max assistance to sit up to bed edge. Patient ambulated x 8' with RW with 2 persons for safety. Patient limited by fatigue and pain com-plaints of the left knee .  Patient's daughter present, expressing concern for patient discharge home with the caregivers needing to provide extensive assistance , based on today's performance.  Patient currently not meeting PT goals for safe DC home.  Daughter asking about  rehab but states MD is not favoring SNf. Continue PT for progressive mobility and safety and caregiver education..  Recommendations for follow up therapy are one component of a multi-disciplinary discharge planning process, led by the attending physician.  Recommendations may be updated based on patient status, additional functional criteria and insurance authorization.  Follow Up Recommendations  Home health PT vs SNF      Assistance Recommended at Discharge Frequent or constant Supervision/Assistance  Patient can return home with the following A lot of help with bathing/dressing/bathroom;Assistance with cooking/housework;Assist for transportation;A lot of help with walking and/or transfers;Help with stairs or ramp for entrance;Direct supervision/assist for medications management   Equipment Recommendations  None recommended by PT    Recommendations for Other Services       Precautions / Restrictions Precautions Precautions: Fall;Knee Restrictions LLE Weight Bearing: Weight bearing as tolerated     Mobility  Bed Mobility Overal bed mobility: Needs Assistance Bed Mobility: Supine to Sit      Supine to sit: Max assist, +2 for safety/equipment, HOB elevated     General bed mobility comments: patient assisting with legs, required max assist to fully sit upright, bed pad  used to scoot forward. Patient then able  to scoot forward farther so feet touching floor.    Transfers Overall transfer level: Needs assistance Equipment used: Rolling walker (2 wheels)   Sit to Stand: Mod assist, +2 safety/equipment           General transfer comment: + 2 mod Assist to power up  to stand, to steady balance once standing, cues for pushing into RW, tendency to lean posteriorly.    Ambulation/Gait Ambulation/Gait assistance: Mod assist, +2 safety/equipment Gait Distance (Feet): 8 Feet Assistive device: Rolling walker (2 wheels) Gait Pattern/deviations: Step-to pattern, Antalgic, Trunk flexed Gait velocity: decr     General Gait Details: cues for trunk posture, patient has kyphosis, Cues for step length on right  leg. Right leg/foot noted to be externally rotated. Tolerated WBAT on the left leg. Recliner brought up.   Stairs             Wheelchair Mobility    Modified Rankin (Stroke Patients Only)       Balance Overall balance assessment: Needs assistance Sitting-balance support: Bilateral upper extremity supported, Feet supported Sitting balance-Leahy Scale: Fair Sitting balance - Comments: tending to lean forward, kyphosis   Standing balance support: Bilateral upper extremity supported, During functional activity, Reliant on assistive device for balance Standing balance-Leahy Scale: Poor Standing balance comment: relies on therapist support for balance.  Cognition Arousal/Alertness: Awake/alert Behavior During Therapy: Flat affect, WFL for tasks assessed/performed Overall Cognitive Status: Within Functional Limits for tasks assessed                                 General Comments: except off a day. Knows  surgery was Monday        Exercises Total Joint Exercises Ankle Circles/Pumps: AROM, Both, 10 reps, Supine Heel Slides: AAROM, AROM, Both, 10 reps, Supine Straight Leg Raises: AAROM, Both, 10 reps, Supine Goniometric ROM: 10-60 left knee flex    General Comments        Pertinent Vitals/Pain Pain Assessment Pain Score: 8  Pain Location: left knee Pain Descriptors / Indicators: Aching, Discomfort Pain Intervention(s): Monitored during session, Premedicated before session    Home Living                          Prior Function            PT Goals (current goals can now be found in the care plan section) Progress towards PT goals: Progressing toward goals    Frequency    7X/week      PT Plan Current plan remains appropriate    Co-evaluation              AM-PAC PT "6 Clicks" Mobility   Outcome Measure  Help needed turning from your back to your side while in a flat bed without using bedrails?: A Lot Help needed moving from lying on your back to sitting on the side of a flat bed without using bedrails?: A Lot Help needed moving to and from a bed to a chair (including a wheelchair)?: A Lot Help needed standing up from a chair using your arms (e.g., wheelchair or bedside chair)?: A Lot Help needed to walk in hospital room?: Total Help needed climbing 3-5 steps with a railing? : Total 6 Click Score: 10    End of Session Equipment Utilized During Treatment: Gait belt Activity Tolerance: Patient limited by lethargy Patient left: in chair;with call bell/phone within reach;with family/visitor present Nurse Communication: Mobility status PT Visit Diagnosis: Unsteadiness on feet (R26.81);Difficulty in walking, not elsewhere classified (R26.2);Pain Pain - Right/Left: Left Pain - part of body: Knee     Time: 9150-5697 PT Time Calculation (min) (ACUTE ONLY): 34 min  Charges:  $Gait Training: 8-22 mins                    [place] Pager [phone] Office [phone]  Claretha Cooper 09/20/2021, 8:54 AM

## 2021-09-20 NOTE — Evaluation (Signed)
Occupational Therapy Evaluation Patient Details Name: Lakia Gritton MRN: [id number] DOB: [birth year] Today's Date: 09/20/2021   History of Present Illness cassanda walmer is an 88 yo female S/P L TKA on 09/18/2021. PMH: Stroke, HTN, Pericardial effusion, CKD, lumbar laminectomy, CAD. Post op lethargy   Clinical Impression   Mrs. Zalia Hautala is an 88 year old woman who typically is independent with cane and lives alone. Her daughter reports needing to use a walker for the last three weeks. On evaluation she presents with generalized weakness, decreased activity tolerance, impaired balance and pain. She required max assist to transfer to side of bed, assistance to stand and ambulate late short distance in room and +2 for safety due to potential for buckling. She is needing max -total assist for LB ADLs and toileting. Patient will benefit from skilled OT services while in hospital to improve deficits and learn compensatory strategies as needed in order to return to PLOF.  Patient reports concern in regards to discharge plan - they have 24/7 assistance planned initially but are concerned about the amount of physical assistance patient currently requiring. They also report MD says she has to go home and then start receiving OP PT after two weeks. She currently is requiring significant assistance and is not safe to discharge home. Will continue to follow ac\utely.       Recommendations for follow up therapy are one component of a multi-disciplinary discharge planning process, led by the attending physician.  Recommendations may be updated based on patient status, additional functional criteria and insurance authorization.   Follow Up Recommendations  Follow physician's recommendations for discharge plan and follow up therapies    Assistance Recommended at Discharge Frequent or constant Supervision/Assistance  Patient can return home with the following A lot of help with walking and/or transfers;A lot of help with  bathing/dressing/bathroom;Assistance with cooking/housework;Help with stairs or ramp for entrance    Functional Status Assessment  Patient has had a recent decline in their functional status and demonstrates the ability to make significant improvements in function in a reasonable and predictable amount of time.  Equipment Recommendations   (they think they have a BSC)    Recommendations for Other Services       Precautions / Restrictions Precautions Precautions: Fall;Knee Restrictions Weight Bearing Restrictions: No LLE Weight Bearing: Weight bearing as tolerated      Mobility Bed Mobility                    Transfers                          Balance Overall balance assessment: Needs assistance Sitting-balance support: No upper extremity supported Sitting balance-Leahy Scale: Fair     Standing balance support: Reliant on assistive device for balance Standing balance-Leahy Scale: Poor                             ADL either performed or assessed with clinical judgement   ADL Overall ADL's : Needs assistance/impaired Eating/Feeding: Set up;Sitting Eating/Feeding Details (indicate cue type and reason): in recliner Grooming: Set up;Sitting;Oral care;Wash/dry face Grooming Details (indicate cue type and reason): in recliner Upper Body Bathing: Set up;Sitting   Lower Body Bathing: Maximal assistance;Sit to/from stand   Upper Body Dressing : Set up;Sitting   Lower Body Dressing: Total assistance;Sit to/from stand   Toilet Transfer: Minimal assistance;BSC/3in1;Rolling walker (2 wheels) Toilet Transfer Details (indicate  cue type and reason): as demonstrated via transfer to [place] and Hygiene: Maximal assistance;Sit to/from stand       Functional mobility during ADLs: Rolling walker (2 wheels);+2 for physical assistance General ADL Comments: Max assist to transfer to side of bed and mod x 2 assist to stand  and ambulate to door with RW and chair follow. Patient exhibits poor activity tolerance and reports feeling weak.     Vision Patient Visual Report: No change from baseline       Perception     Praxis      Pertinent Vitals/Pain Pain Assessment Pain Assessment: 0-10 Pain Score: 8  Pain Location: left knee Pain Descriptors / Indicators: Aching, Discomfort Pain Intervention(s): Limited activity within patient's tolerance     Hand Dominance Right   Extremity/Trunk Assessment Upper Extremity Assessment Upper Extremity Assessment: Generalized weakness   Lower Extremity Assessment Lower Extremity Assessment: Defer to PT evaluation   Cervical / Trunk Assessment Cervical / Trunk Assessment: Kyphotic   Communication Communication Communication: HOH   Cognition Arousal/Alertness: Awake/alert Behavior During Therapy: WFL for tasks assessed/performed Overall Cognitive Status: Within Functional Limits for tasks assessed                                       General Comments       Exercises     Shoulder Instructions      Home Living Family/patient expects to be discharged to:: Private residence Living Arrangements: Children;Non-relatives/Friends Available Help at Discharge: Family;Available 24 hours/day Type of Home: House Home Access: Level entry     Home Layout: One level     Bathroom Shower/Tub: Tub/shower unit         Home Equipment: Conservation officer, nature (2 wheels);BSC/3in1          Prior Functioning/Environment Prior Level of Function : Needs assist       Physical Assist : Mobility (physical);ADLs (physical)   ADLs (physical): Bathing;Grooming;Dressing Mobility Comments: has had caregivers fro 8 AM-5 PM, home alone at night ADLs Comments: helpers assist with  ADL's        OT Problem List: Decreased strength;Decreased activity tolerance;Impaired balance (sitting and/or standing);Decreased knowledge of use of DME or AE;Pain      OT  Treatment/Interventions:      OT Goals(Current goals can be found in the care plan section) Acute Rehab OT Goals Patient Stated Goal: to regain strength OT Goal Formulation: With patient/family Time For Goal Achievement: 10/04/21 Potential to Achieve Goals: Good  OT Frequency:      Co-evaluation              AM-PAC OT "6 Clicks" Daily Activity     Outcome Measure Help from another person eating meals?: A Little Help from another person taking care of personal grooming?: A Little Help from another person toileting, which includes using toliet, bedpan, or urinal?: A Lot Help from another person bathing (including washing, rinsing, drying)?: A Lot Help from another person to put on and taking off regular upper body clothing?: A Little Help from another person to put on and taking off regular lower body clothing?: Total 6 Click Score: 14   End of Session Equipment Utilized During Treatment: Gait belt;Rolling walker (2 wheels) CPM Left Knee CPM Left Knee: Off Additional Comments: Cpm off at 800  today Nurse Communication: Mobility status  Activity Tolerance: Patient limited by fatigue Patient left: in  chair;with call bell/phone within reach;with family/visitor present  OT Visit Diagnosis: Other abnormalities of gait and mobility (R26.89)                Time: 1661-9694 OT Time Calculation (min): 21 min Charges:  OT General Charges $OT Visit: 1 Visit OT Evaluation $OT Eval Low Complexity: 1 Low  Gill Delrossi, OTR/L [place]  Office [phone] Pager: [place] 09/20/2021, 9:35 AM

## 2021-09-20 NOTE — Progress Notes (Signed)
Orthopedic Tech Progress Note Patient Details:  Colleen Watkins Mar 18, 1933 [id number]      Post Interventions Patient Tolerated: Well Instructions Provided: Care of device, Adjustment of device  [name] [name] 09/20/2021, 4:58 PM [employer] CPM

## 2021-09-21 LAB — CBC
HCT: 29.8 % — ABNORMAL LOW (ref 36.0–46.0)
Hemoglobin: 9.7 g/dL — ABNORMAL LOW (ref 12.0–15.0)
MCH: 31.2 pg (ref 26.0–34.0)
MCHC: 32.6 g/dL (ref 30.0–36.0)
MCV: 95.8 fL (ref 80.0–100.0)
Platelets: 114 10*3/uL — ABNORMAL LOW (ref 150–400)
RBC: 3.11 MIL/uL — ABNORMAL LOW (ref 3.87–5.11)
RDW: 14.1 % (ref 11.5–15.5)
WBC: 7.7 10*3/uL (ref 4.0–10.5)
nRBC: 0 % (ref 0.0–0.2)

## 2021-09-21 NOTE — Progress Notes (Signed)
SPORTS MEDICINE AND JOINT REPLACEMENT  Colleen Mulch, MD    Carlyon Shadow, PA-C [place], [place], [place]  [address]                             [phone]   PROGRESS NOTE  Subjective:  negative for Chest Pain  negative for Shortness of Breath  negative for Nausea/Vomiting   negative for Calf Pain  negative for Bowel Movement   Tolerating Diet: no         Patient reports pain as 5 on 0-10 scale.    Objective: Vital signs in last 24 hours:   Patient Vitals for the past 24 hrs:  BP Temp Temp src Pulse Resp SpO2  09/21/21 0654 (!) 162/69 98.3 F (36.8 C) Oral 76 14 97 %  09/20/21 2150 (!) 152/63 99.2 F (37.3 C) Oral 85 16 94 %  09/20/21 1354 136/60 99.2 F (37.3 C) Oral 78 20 95 %    @flow {1959:LAST@   Intake/Output from previous day:   02/08 0701 - 02/09 0700 In: 460 [P.O.:460] Out: 2425 [Urine:2425]   Intake/Output this shift:   No intake/output data recorded.   Intake/Output      02/08 0701 02/09 0700 02/09 0701 02/10 0700   P.O. 460    I.V. (mL/kg) 0 (0)    Total Intake(mL/kg) 460 (7.2)    Urine (mL/kg/hr) 2425 (1.6)    Stool     Total Output 2425    Net -1965            LABORATORY DATA: Recent Labs    09/14/21 0947 09/19/21 0342 09/20/21 0323 09/21/21 0313  WBC 5.1 8.5 9.3 7.7  HGB 11.9* 11.2* 9.9* 9.7*  HCT 36.6 35.2* 30.7* 29.8*  PLT 144* 122* 112* 114*   Recent Labs    09/14/21 0947 09/19/21 0342  NA 137 139  K 3.9 3.9  CL 104 109  CO2 25 23  BUN 37* 21  CREATININE 1.01* 0.76  GLUCOSE 99 125*  CALCIUM 9.9 9.3   No results found for: INR, PROTIME  Examination:  General appearance: alert, cooperative, and no distress Extremities: extremities normal, atraumatic, no cyanosis or edema  Wound Exam: clean, dry, intact   Drainage:  None: wound tissue dry  Motor Exam: Quadriceps and Hamstrings Intact  Sensory Exam: Superficial Peroneal, Deep Peroneal, and Tibial normal   Assessment:    3 Days Post-Op   Procedure(s) (LRB): TOTAL KNEE ARTHROPLASTY (Left)  ADDITIONAL DIAGNOSIS:  Principal Problem:   S/P total knee replacement Active Problems:   S/P TKR (total knee replacement) using cement, left     Plan: Physical Therapy as ordered Weight Bearing as Tolerated (WBAT)  DVT Prophylaxis:  Aspirin  Patient much more alert and oriented on tylenol only. Continue to progress with PT     Anticipated LOS equal to or greater than 2 midnights due to - Age 34 and older with one or more of the following:  - Obesity  - Expected need for hospital services (PT, OT, Nursing) required for safe  discharge  - Anticipated need for postoperative skilled nursing care or inpatient rehab  Donia Ast 09/21/2021, 9:16 AM

## 2021-09-21 NOTE — Plan of Care (Signed)
  Problem: Safety: Goal: Ability to remain free from injury will improve Outcome: Progressing   Problem: Pain Managment: Goal: General experience of comfort will improve Outcome: Progressing   

## 2021-09-21 NOTE — Progress Notes (Signed)
Occupational Therapy Treatment Patient Details Name: Colleen Watkins MRN: [id number] DOB: 03-24-1933 Today's Date: 09/21/2021   History of present illness Colleen Watkins is an 88 yo female S/P L TKA on 09/18/2021. PMH: Stroke, HTN, Pericardial effusion, CKD, lumbar laminectomy, CAD. Post op lethargy   OT comments  Patient still needing max assist to transfer to side of bed and intermittent min assist to stand. Poor activity tolerance and generalized weakness resulting in patient only able to transfer to [facility] instead of ambulating to bathroom. Patient still needing significant assistance for bed mobility and ADLs. Recommend short term rehab at discharge.    Recommendations for follow up therapy are one component of a multi-disciplinary discharge planning process, led by the attending physician.  Recommendations may be updated based on patient status, additional functional criteria and insurance authorization.    Follow Up Recommendations  Skilled nursing-short term rehab (<3 hours/day)    Assistance Recommended at Discharge Frequent or constant Supervision/Assistance  Patient can return home with the following  A lot of help with walking and/or transfers;A lot of help with bathing/dressing/bathroom;Assistance with cooking/housework;Help with stairs or ramp for entrance   Equipment Recommendations       Recommendations for Other Services      Precautions / Restrictions Precautions Precautions: Fall;Knee Restrictions Weight Bearing Restrictions: No LLE Weight Bearing: Weight bearing as tolerated          Balance Overall balance assessment: Needs assistance Sitting-balance support: No upper extremity supported, Feet supported Sitting balance-Leahy Scale: Good     Standing balance support: Reliant on assistive device for balance Standing balance-Leahy Scale: Poor                             ADL either performed or assessed with clinical judgement   ADL Overall ADL's : Needs  assistance/impaired                         Toilet Transfer: BSC/3in1;Rolling walker (2 wheels);Min guard Toilet Transfer Details (indicate cue type and reason): Min guard to transfer from edge of bed to [facility] with use of RW. Slow movement and verbal cues for hand placement Toileting- Clothing Manipulation and Hygiene: Maximal assistance;Sit to/from stand Toileting - Clothing Manipulation Details (indicate cue type and reason): for clothing management       General ADL Comments: Patient max assist to transfer to side of bed. Min assist  and elevated head of bed to stand from bed. Min guard to stand from [facility] and take steps to recliner. Continues to exhibit poor activity tolerance.    Extremity/Trunk Assessment               Cognition Arousal/Alertness: Awake/alert Behavior During Therapy: WFL for tasks assessed/performed Overall Cognitive Status: Within Functional Limits for tasks assessed                                 General Comments: HOH                   Pertinent Vitals/ Pain       Pain Assessment Pain Assessment: 0-10 Pain Score: 7  Pain Location: left knee Pain Descriptors / Indicators: Grimacing, Discomfort, Sharp Pain Intervention(s): Limited activity within patient's tolerance, Monitored during session, Premedicated before session   Frequency  Min 2X/week        Progress Toward Goals  OT Goals(current goals can now be found in the care plan section)  Progress towards OT goals: Progressing toward goals  Acute Rehab OT Goals Patient Stated Goal: require less assistance OT Goal Formulation: With patient/family Time For Goal Achievement: 10/04/21 Potential to Achieve Goals: Good  Plan Discharge plan needs to be updated    Co-evaluation                 AM-PAC OT "6 Clicks" Daily Activity     Outcome Measure   Help from another person eating meals?: A Little Help from another person taking care of personal  grooming?: A Little Help from another person toileting, which includes using toliet, bedpan, or urinal?: A Lot Help from another person bathing (including washing, rinsing, drying)?: A Lot Help from another person to put on and taking off regular upper body clothing?: A Little Help from another person to put on and taking off regular lower body clothing?: A Lot 6 Click Score: 15    End of Session Equipment Utilized During Treatment: Gait belt;Rolling walker (2 wheels)  OT Visit Diagnosis: Other abnormalities of gait and mobility (R26.89)   Activity Tolerance Patient limited by fatigue   Patient Left in chair;with call bell/phone within reach;with family/visitor present   Nurse Communication Mobility status        Time: 5947-0761 OT Time Calculation (min): 14 min  Charges: OT General Charges $OT Visit: 1 Visit OT Treatments $Self Care/Home Management : 8-22 mins  Derl Barrow, OTR/L [place]  Office [phone] Pager: [place] 09/21/2021, 12:31 PM

## 2021-09-21 NOTE — Progress Notes (Signed)
Physical Therapy Treatment Patient Details Name: Colleen Watkins MRN: [id number] DOB: [birth year] Today's Date: 09/21/2021   History of Present Illness Colleen Watkins is an 88 yo female S/P L TKA on 09/18/2021. PMH: Stroke, HTN, Pericardial effusion, CKD, lumbar laminectomy, CAD. Post op lethargy    PT Comments    The patient is alert. Progressing with mobility. Per family, plans are for SNF for rehab.   Recommendations for follow up therapy are one component of a multi-disciplinary discharge planning process, led by the attending physician.  Recommendations may be updated based on patient status, additional functional criteria and insurance authorization.  Follow Up Recommendations  Skilled nursing-short term rehab (<3 hours/day)     Assistance Recommended at Discharge Frequent or constant Supervision/Assistance  Patient can return home with the following A lot of help with bathing/dressing/bathroom;Assistance with cooking/housework;Assist for transportation;A lot of help with walking and/or transfers;Help with stairs or ramp for entrance;Direct supervision/assist for medications management   Equipment Recommendations  None recommended by PT    Recommendations for Other Services       Precautions / Restrictions Precautions Precautions: Fall;Knee     Mobility  Bed Mobility   Bed Mobility: Supine to Sit, Sit to Supine     Supine to sit: Min assist Sit to supine: Mod assist   General bed mobility comments: assist legs and trunk    Transfers Overall transfer level: Needs assistance Equipment used: Rolling walker (2 wheels) Transfers: Sit to/from Stand Sit to Stand: Min assist           General transfer comment: min assist from bed and   and BSC. extra time, staedy assist to balance    Ambulation/Gait Ambulation/Gait assistance: Min assist, +2 safety/equipment Gait Distance (Feet): 20 Feet (x 2) Assistive device: Rolling walker (2 wheels) Gait Pattern/deviations: Step-to  pattern, Step-through pattern, Trunk flexed Gait velocity: decr     General Gait Details: Patient has flexed posture, present PTA per family. cues to look ahead. Cues for position inside Rw, tending to not  place Rw far enough forward, legs close to front. R  foot turns out(: my mother's was the same way" patient said   Stairs             Wheelchair Mobility    Modified Rankin (Stroke Patients Only)       Balance Overall balance assessment: Needs assistance Sitting-balance support: No upper extremity supported, Feet supported Sitting balance-Leahy Scale: Good Sitting balance - Comments: tending to lean forward, kyphosis   Standing balance support: Reliant on assistive device for balance Standing balance-Leahy Scale: Poor                              Cognition Arousal/Alertness: Awake/alert Behavior During Therapy: WFL for tasks assessed/performed Overall Cognitive Status: Within Functional Limits for tasks assessed                                 General Comments: HOH        Exercises T   General Comments        Pertinent Vitals/Pain Pain Assessment Faces Pain Scale: Hurts a little bit Pain Location: left knee, when sitting down quickly, knee flexed Pain Descriptors / Indicators: Grimacing, Discomfort Pain Intervention(s): Monitored during session    Home Living  Prior Function            PT Goals (current goals can now be found in the care plan section) Progress towards PT goals: Progressing toward goals    Frequency    7X/week      PT Plan Current plan remains appropriate    Co-evaluation              AM-PAC PT "6 Clicks" Mobility   Outcome Measure  Help needed turning from your back to your side while in a flat bed without using bedrails?: A Lot Help needed moving from lying on your back to sitting on the side of a flat bed without using bedrails?: A Lot Help needed  moving to and from a bed to a chair (including a wheelchair)?: A Lot Help needed standing up from a chair using your arms (e.g., wheelchair or bedside chair)?: A Lot Help needed to walk in hospital room?: A Lot Help needed climbing 3-5 steps with a railing? : Total 6 Click Score: 11    End of Session Equipment Utilized During Treatment: Gait belt Activity Tolerance: Patient tolerated treatment well Patient left: in bed;with call bell/phone within reach;with family/visitor present;with bed alarm set Nurse Communication: Mobility status PT Visit Diagnosis: Unsteadiness on feet (R26.81);Difficulty in walking, not elsewhere classified (R26.2);Pain Pain - Right/Left: Left Pain - part of body: Knee     Time: 1440-1515 PT Time Calculation (min) (ACUTE ONLY): 35 min  Charges:  $Gait Training: 8-22 mins $Self Care/Home Management: [place] Pager [phone] Office [phone]    Colleen Watkins 09/21/2021, 5:22 PM

## 2021-09-21 NOTE — Progress Notes (Signed)
Orthopedic Tech Progress Note Patient Details:  Colleen Watkins 1932/12/12 [id number]  Patient ID: Colleen Watkins, female   DOB: Jun 24, 1933, 88 y.o.   MRN: [id number]  Colleen Watkins 09/21/2021, 3:13 PM Patient placed in cpm

## 2021-09-21 NOTE — TOC Progression Note (Signed)
Transition of Care [facility]) - Progression Note   Patient Details  Name: Colleen Watkins MRN: [id number] Date of Birth: 03-24-1933  Transition of Care [facility]) CM/SW [place], LCSW Phone Number: 09/21/2021, 12:17 PM  Clinical Narrative: Due to patient's limited progress with PT, patient will now need SNF. FL2 done; PASRR verified. Initial referral faxed out. TOC awaiting bed offers.  Expected Discharge Plan: Skilled Nursing Facility Barriers to Discharge: SNF Pending bed offer, Insurance Authorization  Expected Discharge Plan and Services Expected Discharge Plan: [place] Choice: [name]             DME Arranged: N/A DME Agency: NA  Readmission Risk Interventions No flowsheet data found.

## 2021-09-21 NOTE — Progress Notes (Signed)
Orthopedic Tech Progress Note Patient Details:  Colleen Watkins 30-Jul-1933 [id number]  Patient ID: Colleen Watkins, female   DOB: 1932/09/20, 88 y.o.   MRN: [id number]  Colleen Watkins 09/21/2021, 4:38 PM Cpm removed from patient due to pain

## 2021-09-21 NOTE — Plan of Care (Signed)
  Problem: Health Behavior/Discharge Planning: Goal: Ability to manage health-related needs will improve Outcome: Progressing   Problem: Activity: Goal: Risk for activity intolerance will decrease Outcome: Progressing   Problem: Elimination: Goal: Will not experience complications related to bowel motility Outcome: Progressing   Problem: Pain Managment: Goal: General experience of comfort will improve Outcome: Progressing   

## 2021-09-21 NOTE — NC FL2 (Signed)
[place] LEVEL OF CARE SCREENING TOOL     IDENTIFICATION  Patient Name: Colleen Watkins Birthdate: 06-05-1933 Sex: female Admission Date (Current Location): 09/18/2021  [facility] [place] Number:  Herbalist and Address:  [facility],  [place], [place]      Provider Number: [id number]  Attending Physician Name and Address:  Vickey Huger, MD  Relative Name and Phone Number:  Lanier Ensign (daughter) Ph: [phone]    Current Level of Care: Hospital Recommended Level of Care: [place] Prior Approval Number:    Date Approved/Denied:   PASRR Number: [id number]  Discharge Plan: SNF    Current Diagnoses: Patient Active Problem List   Diagnosis Date Noted   S/P TKR (total knee replacement) using cement, left 09/20/2021   S/P total knee replacement 09/18/2021   Preop cardiovascular exam 06/27/2021   Stage 3b chronic kidney disease ([place]) 05/02/2021   Chronic diarrhea 02/07/2021   PONV (postoperative nausea and vomiting)    Hypertension    GERD (gastroesophageal reflux disease)    Complication of anesthesia    Cancer ([place])    Anxiety    Arthritis    Recurrent UTI 09/28/2020   Late effect of cerebrovascular accident (CVA) 08/31/2020   History of stroke 08/25/2020   Stroke ([place]) 08/13/2020   Anemia 07/01/2020   Malaise and fatigue 06/30/2020   Obstructive sleep apnea syndrome 06/30/2020   Pes anserinus bursitis of right knee 12/29/2019   Trochanteric bursitis of right hip 12/29/2019   Abdominal pain 06/29/2019   Cyst of ovary 06/29/2019   Swelling of both lower extremities 01/28/2019   Dyspnea on exertion 01/28/2019   Chronic pain of left knee 01/21/2017   Coronary artery disease involving native coronary artery of native heart without angina pectoris 05/04/2015   Pericardial effusion 05/04/2015   History of GI bleed 05/04/2015   S/P lumbar laminectomy 02/17/2015   HTN (hypertension) 04/21/2013     Orientation RESPIRATION BLADDER Height & Weight     Self, Time, Situation, Place  Normal Continent Weight: 140 lb (63.5 kg) Height:  5\' 1"  (154.9 cm)  BEHAVIORAL SYMPTOMS/MOOD NEUROLOGICAL BOWEL NUTRITION STATUS   (N/A)  (N/A) Continent Diet (Regular diet)  AMBULATORY STATUS COMMUNICATION OF NEEDS Skin   Limited Assist Verbally Surgical wounds                       Personal Care Assistance Level of Assistance  Bathing, Feeding, Dressing Bathing Assistance: Maximum assistance (Patient is max assist with lower body.) Feeding assistance: Limited assistance Dressing Assistance: Maximum assistance (Patient is max assist with lower body.)     Functional Limitations Info  Sight, Hearing, Speech Sight Info: Adequate Hearing Info: Impaired Speech Info: Adequate    SPECIAL CARE FACTORS FREQUENCY  PT (By licensed PT), OT (By licensed OT)     PT Frequency: 5x's/week OT Frequency: 5x's/week            Contractures Contractures Info: Not present    Additional Factors Info  Code Status, Allergies, Psychotropic Code Status Info: Full Allergies Info: Oxycodone, Penicillins, Codeine, Hydrocortisone, Sulfa Antibiotics Psychotropic Info: Lexapro         Current Medications (09/21/2021):  This is the current hospital active medication list Current Facility-Administered Medications  Medication Dose Route Frequency Provider Last Rate Last Admin   0.9 %  sodium chloride infusion   Intravenous Continuous Donia Ast, [place]   Stopped at 09/21/21 [phone]   acetaminophen (  TYLENOL) suppository 650 mg  650 mg Rectal Q4H PRN Donia Ast, [place]   650 mg at 09/18/21 2135   acetaminophen (TYLENOL) tablet 1,000 mg  1,000 mg Oral Q6H PRN Donia Ast, [place]   1,000 mg at 09/21/21 0825   alum & mag hydroxide-simeth (MAALOX/MYLANTA) 200-200-20 MG/5ML suspension 30 mL  30 mL Oral Q4H PRN Donia Ast, PA       amLODipine (NORVASC) tablet 5 mg  5 mg Oral q AM Donia Ast, [place]   5 mg at 09/21/21 7858   aspirin EC tablet 325 mg  325 mg Oral BID Donia Ast, [place]   325 mg at 09/21/21 8502   atorvastatin (LIPITOR) tablet 40 mg  40 mg Oral QHS Donia Ast, [place]   40 mg at 09/20/21 2145   bisacodyl (DULCOLAX) EC tablet 5 mg  5 mg Oral Daily PRN Donia Ast, PA       Chlorhexidine Gluconate Cloth 2 % PADS 6 each  6 each Topical Daily Vickey Huger, MD   6 each at 09/20/21 1005   diphenhydrAMINE (BENADRYL) 12.5 MG/5ML elixir 12.5-25 mg  12.5-25 mg Oral Q4H PRN Donia Ast, PA       docusate sodium (COLACE) capsule 100 mg  100 mg Oral BID Donia Ast, [place]   100 mg at 09/21/21 0825   escitalopram (LEXAPRO) tablet 10 mg  10 mg Oral QHS Donia Ast, [place]   10 mg at 09/20/21 2145   ferrous sulfate tablet 325 mg  325 mg Oral TID PC Donia Ast, [place]   325 mg at 09/21/21 7741   furosemide (LASIX) tablet 40 mg  40 mg Oral q AM Donia Ast, PA   40 mg at 09/21/21 2878   HYDROmorphone (DILAUDID) injection 0.5-1 mg  0.5-1 mg Intravenous Q4H PRN Donia Ast, PA       labetalol (NORMODYNE) tablet 200 mg  200 mg Oral BID Donia Ast, [place]   200 mg at 09/21/21 6767   losartan (COZAAR) tablet 100 mg  100 mg Oral QHS Donia Ast, [place]   100 mg at 09/20/21 2145   menthol-cetylpyridinium (CEPACOL) lozenge 3 mg  1 lozenge Oral PRN Donia Ast, PA       Or   phenol (CHLORASEPTIC) mouth spray 1 spray  1 spray Mouth/Throat PRN Donia Ast, PA       methocarbamol (ROBAXIN) tablet 500 mg  500 mg Oral Q6H PRN Donia Ast, [place]   500 mg at 09/18/21 1521   Or   methocarbamol (ROBAXIN) 500 mg in dextrose 5 % 50 mL IVPB  500 mg Intravenous Q6H PRN Donia Ast, PA       metoCLOPramide (REGLAN) tablet 5-10 mg  5-10 mg Oral Q8H PRN Donia Ast, [place]       Or   metoCLOPramide (REGLAN) injection 5-10 mg  5-10 mg Intravenous Q8H PRN Donia Ast, [place]       ondansetron [facility]) tablet 4  mg  4 mg Oral Q6H PRN Donia Ast, [place]       Or   ondansetron [facility]) injection 4 mg  4 mg Intravenous Q6H PRN Donia Ast, [place]   4 mg at 09/21/21 2094   oxyCODONE (Oxy IR/ROXICODONE) immediate release tablet 5-10 mg  5-10 mg Oral Q4H PRN Donia Ast, PA       pantoprazole (PROTONIX) EC tablet 40 mg  40  mg Oral q AM Donia Ast, [place]   40 mg at 09/21/21 4431   senna-docusate (Senokot-S) tablet 1 tablet  1 tablet Oral QHS PRN Donia Ast, PA       sodium phosphate (FLEET) 7-19 GM/118ML enema 1 enema  1 enema Rectal Once PRN Donia Ast, PA       traMADol Veatrice [place]) tablet 50 mg  50 mg Oral Q6H Carlyon Shadow [place], [place]   50 mg at 09/18/21 1347   zolpidem (AMBIEN) tablet 5 mg  5 mg Oral QHS PRN Donia Ast, [place]         Discharge Medications: Please see discharge summary for a list of discharge medications.  Relevant Imaging Results:  Relevant Lab Results:   Additional Information SSN: [id number]  Sherie Don, LCSW

## 2021-09-21 NOTE — Progress Notes (Signed)
Physical Therapy Treatment Patient Details Name: Colleen Watkins MRN: [id number] DOB: May 12, 1933 Today's Date: 09/21/2021   History of Present Illness Colleen Watkins is an 88 yo female S/P L TKA on 09/18/2021. PMH: Stroke, HTN, Pericardial effusion, CKD, lumbar laminectomy, CAD. Post op lethargy    PT Comments    The patient was sleeping and aroused readily , able to participate. Granddaughter present. Patient performed lt knee exercises and ambulated x 20' with RW, min-mod assist for balance and recliner followed. Per family, plans are for SNF rehab.   Recommendations for follow up therapy are one component of a multi-disciplinary discharge planning process, led by the attending physician.  Recommendations may be updated based on patient status, additional functional criteria and insurance authorization.  Follow Up Recommendations  Skilled nursing-short term rehab (<3 hours/day)     Assistance Recommended at Discharge Frequent or constant Supervision/Assistance  Patient can return home with the following A lot of help with bathing/dressing/bathroom;Assistance with cooking/housework;Assist for transportation;A lot of help with walking and/or transfers;Help with stairs or ramp for entrance;Direct supervision/assist for medications management   Equipment Recommendations  None recommended by PT    Recommendations for Other Services       Precautions / Restrictions Precautions Precautions: Fall;Knee Restrictions Weight Bearing Restrictions: No LLE Weight Bearing: Weight bearing as tolerated     Mobility  Bed Mobility               General bed mobility comments: in recliner    Transfers Overall transfer level: Needs assistance Equipment used: Rolling walker (2 wheels) Transfers: Sit to/from Stand Sit to Stand: Mod assist, +2 safety/equipment, Max assist           General transfer comment: steady assist  after standing to transition hands to RW. Patient right back down after  First stand. Patient pushed from recliner and reaches back without cues.    Ambulation/Gait Ambulation/Gait assistance: Min assist, +2 safety/equipment Gait Distance (Feet): 20 Feet Assistive device: Rolling walker (2 wheels) Gait Pattern/deviations: Step-to pattern, Step-through pattern, Trunk flexed Gait velocity: decr     General Gait Details: Patient has flexed posture, present PTA per family. cues to look ahead. Cues for position inside Rw, tending to not  place Rw far enough forward, legs close to front.   Stairs             Wheelchair Mobility    Modified Rankin (Stroke Patients Only)       Balance Overall balance assessment: Needs assistance Sitting-balance support: No upper extremity supported Sitting balance-Leahy Scale: Fair     Standing balance support: Reliant on assistive device for balance, Bilateral upper extremity supported, During functional activity Standing balance-Leahy Scale: Poor                              Cognition Arousal/Alertness: Awake/alert Behavior During Therapy: WFL for tasks assessed/performed Overall Cognitive Status: Within Functional Limits for tasks assessed                                 General Comments: some reminders of events  and personal , follows directions well, HOH, has HA's        Exercises Total Joint Exercises Ankle Circles/Pumps: AROM, Both, 10 reps, Supine Short Arc Quad: AROM, AAROM, Left, 10 reps, Supine Heel Slides: Both, 10 reps Hip ABduction/ADduction: AROM, AAROM, Left, 10 reps, Supine Straight Leg Raises:  AAROM, Left, 10 reps    General Comments        Pertinent Vitals/Pain Pain Assessment Faces Pain Scale: Hurts a little bit Pain Location: left knee, when sitting down quickly, knee flexed Pain Descriptors / Indicators: Grimacing, Discomfort Pain Intervention(s): Monitored during session, Premedicated before session    Home Living                           Prior Function            PT Goals (current goals can now be found in the care plan section) Progress towards PT goals: Progressing toward goals    Frequency    7X/week      PT Plan Current plan remains appropriate    Co-evaluation              AM-PAC PT "6 Clicks" Mobility   Outcome Measure  Help needed turning from your back to your side while in a flat bed without using bedrails?: A Lot Help needed moving from lying on your back to sitting on the side of a flat bed without using bedrails?: A Lot Help needed moving to and from a bed to a chair (including a wheelchair)?: A Lot Help needed standing up from a chair using your arms (e.g., wheelchair or bedside chair)?: A Lot Help needed to walk in hospital room?: Total Help needed climbing 3-5 steps with a railing? : Total 6 Click Score: 10    End of Session Equipment Utilized During Treatment: Gait belt Activity Tolerance: Patient tolerated treatment well Patient left: in chair;with call bell/phone within reach;with chair alarm set;with family/visitor present Nurse Communication: Mobility status PT Visit Diagnosis: Unsteadiness on feet (R26.81);Difficulty in walking, not elsewhere classified (R26.2);Pain Pain - Right/Left: Left Pain - part of body: Knee     Time: 1000-1030 PT Time Calculation (min) (ACUTE ONLY): 30 min  Charges:  $Gait Training: 8-22 mins $Therapeutic Exercise: 8-22 mins                     Tresa Endo PT Acute Rehabilitation Services Pager [phone] Office [phone]    Claretha Cooper 09/21/2021, 11:00 AM

## 2021-09-22 LAB — RESP PANEL BY RT-PCR (FLU A&B, COVID) ARPGX2
Influenza A by PCR: NEGATIVE
Influenza B by PCR: NEGATIVE
SARS Coronavirus 2 by RT PCR: NEGATIVE

## 2021-09-22 MED ORDER — TRAMADOL HCL 50 MG PO TABS: 25.0000 mg | ORAL_TABLET | Freq: Four times a day (QID) | ORAL | 0 refills | Status: DC

## 2021-09-22 MED ORDER — ACETAMINOPHEN 500 MG PO TABS: 500.0000 mg | ORAL_TABLET | Freq: Four times a day (QID) | ORAL | 0 refills | Status: AC | PRN

## 2021-09-22 MED ORDER — ASPIRIN 325 MG PO TBEC: 325.0000 mg | DELAYED_RELEASE_TABLET | Freq: Two times a day (BID) | ORAL | 0 refills | Status: AC

## 2021-09-22 NOTE — Discharge Summary (Signed)
SPORTS MEDICINE & JOINT REPLACEMENT   Lara Mulch, MD   Carlyon Shadow, PA-C [place], [place], [place]  [address]                             [phone]  PATIENT ID: Brandis Wixted        MRN:  [id number]          DOB/AGE: [birth year] / 88 y.o.    DISCHARGE SUMMARY  ADMISSION DATE:    09/18/2021 DISCHARGE DATE:   09/22/2021   ADMISSION DIAGNOSIS: S/P total knee replacement [Z96.659] S/P TKR (total knee replacement) using cement, left [Z96.652]    DISCHARGE DIAGNOSIS:  Osteoarthritis of left knee M17.12    ADDITIONAL DIAGNOSIS: Principal Problem:   S/P total knee replacement Active Problems:   S/P TKR (total knee replacement) using cement, left  Past Medical History:  Diagnosis Date   Abdominal pain 06/29/2019   Anemia 07/01/2020   Anxiety    Arthritis    Cancer ([place])    skin   Chronic pain of left knee 35/32/9924   Complication of anesthesia    Coronary artery disease involving native coronary artery of native heart without angina pectoris 05/04/2015   No symptoms, calcification noted the CT   Cyst of ovary 06/29/2019   Dyspnea on exertion 01/28/2019   GERD (gastroesophageal reflux disease)    occ   History of GI bleed 05/04/2015   History of stroke 08/25/2020   Formatting of this note might be different from the original. Left side of pons   HTN (hypertension) 04/21/2013   Hypertension    Late effect of cerebrovascular accident (CVA) 08/31/2020   Malaise and fatigue 06/30/2020   Obstructive sleep apnea syndrome 06/30/2020   Pericardial effusion 05/04/2015   Moderate by last echocardiogram   Pes anserinus bursitis of right knee 12/29/2019   PONV (postoperative nausea and vomiting)    Recurrent UTI 09/28/2020   S/P lumbar laminectomy 02/17/2015   Stroke ([place]) 08/13/2020   Swelling of both lower extremities 01/28/2019   Trochanteric bursitis of right hip 12/29/2019   UTI (urinary tract infection)     PROCEDURE: Procedure(s): TOTAL KNEE  ARTHROPLASTY on 09/18/2021  CONSULTS:    HISTORY:  See H&P in chart  HOSPITAL COURSE:  Alyxandra Tenbrink is a 88 y.o. admitted on 09/18/2021 and found to have a diagnosis of Osteoarthritis of left knee M17.12.  After appropriate laboratory studies were obtained  they were taken to the operating room on 09/18/2021 and underwent Procedure(s): TOTAL KNEE ARTHROPLASTY.   They were given perioperative antibiotics:  Anti-infectives (From admission, onward)    Start     Dose/Rate Route Frequency Ordered Stop   09/18/21 0630  ceFAZolin (ANCEF) IVPB 2g/100 mL premix        2 g 200 mL/hr over 30 Minutes Intravenous On call to O.R. 09/18/21 2683 09/18/21 0940     .  Patient given tranexamic acid IV or topical and exparel intra-operatively.  Tolerated the procedure well.    POD# 1: Vital signs were stable.  Patient denied Chest pain, shortness of breath, or calf pain.  Patient was started on Aspirin twice daily at 8am.  Consults to PT, OT, and care management were made.  The patient was weight bearing as tolerated.  CPM was placed on the operative leg 0-90 degrees for 6-8 hours a day. When out of the CPM, patient was placed in the foam block to achieve full  extension. Incentive spirometry was taught.  Dressing was changed.       POD #2, Continued  PT for ambulation and exercise program.  IV saline locked.  O2 discontinued.    The remainder of the hospital course was dedicated to ambulation and strengthening.   The patient was discharged on 4 Days Post-Op in  Good condition.  Blood products given:none  DIAGNOSTIC STUDIES: Recent vital signs: Patient Vitals for the past 24 hrs:  BP Temp Temp src Pulse Resp SpO2  09/22/21 0538 (!) 144/57 97.7 F (36.5 C) Oral 73 16 91 %  09/21/21 2047 138/63 98.2 F (36.8 C) Oral 79 16 94 %  09/21/21 1316 (!) 153/66 97.8 F (36.6 C) -- 70 18 93 %       Recent laboratory studies: Recent Labs    09/19/21 0342 09/20/21 0323 09/21/21 0313  WBC 8.5 9.3 7.7  HGB  11.2* 9.9* 9.7*  HCT 35.2* 30.7* 29.8*  PLT 122* 112* 114*   Recent Labs    09/19/21 0342  NA 139  K 3.9  CL 109  CO2 23  BUN 21  CREATININE 0.76  GLUCOSE 125*  CALCIUM 9.3   No results found for: INR, PROTIME   Recent Radiographic Studies :  No results found.  DISCHARGE INSTRUCTIONS: Discharge Instructions     Call MD / Call 911   Complete by: As directed    If you experience chest pain or shortness of breath, CALL 911 and be transported to the hospital emergency room.  If you develope a fever above 101 F, pus (white drainage) or increased drainage or redness at the wound, or calf pain, call your surgeon's office.   Constipation Prevention   Complete by: As directed    Drink plenty of fluids.  Prune juice may be helpful.  You may use a stool softener, such as Colace (over the counter) 100 mg twice a day.  Use MiraLax (over the counter) for constipation as needed.   Diet - low sodium heart healthy   Complete by: As directed    Discharge instructions   Complete by: As directed    INSTRUCTIONS AFTER JOINT REPLACEMENT   Remove items at home which could result in a fall. This includes throw rugs or furniture in walking pathways ICE to the affected joint every three hours while awake for 30 minutes at a time, for at least the first 3-5 days, and then as needed for pain and swelling.  Continue to use ice for pain and swelling. You may notice swelling that will progress down to the foot and ankle.  This is normal after surgery.  Elevate your leg when you are not up walking on it.   Continue to use the breathing machine you got in the hospital (incentive spirometer) which will help keep your temperature down.  It is common for your temperature to cycle up and down following surgery, especially at night when you are not up moving around and exerting yourself.  The breathing machine keeps your lungs expanded and your temperature down.   DIET:  As you were doing prior to hospitalization,  we recommend a well-balanced diet.  DRESSING / WOUND CARE / SHOWERING  Keep the surgical dressing until follow up.  The dressing is water proof, so you can shower without any extra covering.  IF THE DRESSING FALLS OFF or the wound gets wet inside, change the dressing with sterile gauze.  Please use good hand washing techniques before changing the dressing.  Do not use any lotions or creams on the incision until instructed by your surgeon.    ACTIVITY  Increase activity slowly as tolerated, but follow the weight bearing instructions below.   No driving for 6 weeks or until further direction given by your physician.  You cannot drive while taking narcotics.  No lifting or carrying greater than 10 lbs. until further directed by your surgeon. Avoid periods of inactivity such as sitting longer than an hour when not asleep. This helps prevent blood clots.  You may return to work once you are authorized by your doctor.     WEIGHT BEARING   Weight bearing as tolerated with assist device (walker, cane, etc) as directed, use it as long as suggested by your surgeon or therapist, typically at least 4-6 weeks.   EXERCISES  Results after joint replacement surgery are often greatly improved when you follow the exercise, range of motion and muscle strengthening exercises prescribed by your doctor. Safety measures are also important to protect the joint from further injury. Any time any of these exercises cause you to have increased pain or swelling, decrease what you are doing until you are comfortable again and then slowly increase them. If you have problems or questions, call your caregiver or physical therapist for advice.   Rehabilitation is important following a joint replacement. After just a few days of immobilization, the muscles of the leg can become weakened and shrink (atrophy).  These exercises are designed to build up the tone and strength of the thigh and leg muscles and to improve motion.  Often times heat used for twenty to thirty minutes before working out will loosen up your tissues and help with improving the range of motion but do not use heat for the first two weeks following surgery (sometimes heat can increase post-operative swelling).   These exercises can be done on a training (exercise) mat, on the floor, on a table or on a bed. Use whatever works the best and is most comfortable for you.    Use music or television while you are exercising so that the exercises are a pleasant break in your day. This will make your life better with the exercises acting as a break in your routine that you can look forward to.   Perform all exercises about fifteen times, three times per day or as directed.  You should exercise both the operative leg and the other leg as well.  Exercises include:   Quad Sets - Tighten up the muscle on the front of the thigh (Quad) and hold for 5-10 seconds.   Straight Leg Raises - With your knee straight (if you were given a brace, keep it on), lift the leg to 60 degrees, hold for 3 seconds, and slowly lower the leg.  Perform this exercise against resistance later as your leg gets stronger.  Leg Slides: Lying on your back, slowly slide your foot toward your buttocks, bending your knee up off the floor (only go as far as is comfortable). Then slowly slide your foot back down until your leg is flat on the floor again.  Angel Wings: Lying on your back spread your legs to the side as far apart as you can without causing discomfort.  Hamstring Strength:  Lying on your back, push your heel against the floor with your leg straight by tightening up the muscles of your buttocks.  Repeat, but this time bend your knee to a comfortable angle, and push your heel against the  floor.  You may put a pillow under the heel to make it more comfortable if necessary.   A rehabilitation program following joint replacement surgery can speed recovery and prevent re-injury in the future due  to weakened muscles. Contact your doctor or a physical therapist for more information on knee rehabilitation.    CONSTIPATION  Constipation is defined medically as fewer than three stools per week and severe constipation as less than one stool per week.  Even if you have a regular bowel pattern at home, your normal regimen is likely to be disrupted due to multiple reasons following surgery.  Combination of anesthesia, postoperative narcotics, change in appetite and fluid intake all can affect your bowels.   YOU MUST use at least one of the following options; they are listed in order of increasing strength to get the job done.  They are all available over the counter, and you may need to use some, POSSIBLY even all of these options:    Drink plenty of fluids (prune juice may be helpful) and high fiber foods Colace 100 mg by mouth twice a day  Senokot for constipation as directed and as needed Dulcolax (bisacodyl), take with full glass of water  Miralax (polyethylene glycol) once or twice a day as needed.  If you have tried all these things and are unable to have a bowel movement in the first 3-4 days after surgery call either your surgeon or your primary doctor.    If you experience loose stools or diarrhea, hold the medications until you stool forms back up.  If your symptoms do not get better within 1 week or if they get worse, check with your doctor.  If you experience "the worst abdominal pain ever" or develop nausea or vomiting, please contact the office immediately for further recommendations for treatment.   ITCHING:  If you experience itching with your medications, try taking only a single pain pill, or even half a pain pill at a time.  You can also use Benadryl over the counter for itching or also to help with sleep.   TED HOSE STOCKINGS:  Use stockings on both legs until for at least 2 weeks or as directed by physician office. They may be removed at night for sleeping.  MEDICATIONS:   See your medication summary on the "After Visit Summary" that nursing will review with you.  You may have some home medications which will be placed on hold until you complete the course of blood thinner medication.  It is important for you to complete the blood thinner medication as prescribed.  PRECAUTIONS:  If you experience chest pain or shortness of breath - call 911 immediately for transfer to the hospital emergency department.   If you develop a fever greater that 101 F, purulent drainage from wound, increased redness or drainage from wound, foul odor from the wound/dressing, or calf pain - CONTACT YOUR SURGEON.                                                   FOLLOW-UP APPOINTMENTS:  If you do not already have a post-op appointment, please call the office for an appointment to be seen by your surgeon.  Guidelines for how soon to be seen are listed in your "After Visit Summary", but are typically between 1-4 weeks after surgery.  OTHER INSTRUCTIONS:  Knee Replacement:  Do not place pillow under knee, focus on keeping the knee straight while resting. CPM instructions: 0-90 degrees, 2 hours in the morning, 2 hours in the afternoon, and 2 hours in the evening. Place foam block, curve side up under heel at all times except when in CPM or when walking.  DO NOT modify, tear, cut, or change the foam block in any way.  POST-OPERATIVE OPIOID TAPER INSTRUCTIONS: It is important to wean off of your opioid medication as soon as possible. If you do not need pain medication after your surgery it is ok to stop day one. Opioids include: Codeine, Hydrocodone(Norco, Vicodin), Oxycodone(Percocet, oxycontin) and hydromorphone amongst others.  Long term and even short term use of opiods can cause: Increased pain response Dependence Constipation Depression Respiratory depression And more.  Withdrawal symptoms can include Flu like symptoms Nausea, vomiting And more Techniques to manage these  symptoms Hydrate well Eat regular healthy meals Stay active Use relaxation techniques(deep breathing, meditating, yoga) Do Not substitute Alcohol to help with tapering If you have been on opioids for less than two weeks and do not have pain than it is ok to stop all together.  Plan to wean off of opioids This plan should start within one week post op of your joint replacement. Maintain the same interval or time between taking each dose and first decrease the dose.  Cut the total daily intake of opioids by one tablet each day Next start to increase the time between doses. The last dose that should be eliminated is the evening dose.     MAKE SURE YOU:  Understand these instructions.  Get help right away if you are not doing well or get worse.    Thank you for letting us be a part of your medical care team.  It is a privilege we respect greatly.  We hope these instructions will help you stay on track for a fast and full recovery!   Increase activity slowly as tolerated   Complete by: As directed    Post-operative opioid taper instructions:   Complete by: As directed    POST-OPERATIVE OPIOID TAPER INSTRUCTIONS: It is important to wean off of your opioid medication as soon as possible. If you do not need pain medication after your surgery it is ok to stop day one. Opioids include: Codeine, Hydrocodone(Norco, Vicodin), Oxycodone(Percocet, oxycontin) and hydromorphone amongst others.  Long term and even short term use of opiods can cause: Increased pain response Dependence Constipation Depression Respiratory depression And more.  Withdrawal symptoms can include Flu like symptoms Nausea, vomiting And more Techniques to manage these symptoms Hydrate well Eat regular healthy meals Stay active Use relaxation techniques(deep breathing, meditating, yoga) Do Not substitute Alcohol to help with tapering If you have been on opioids for less than two weeks and do not have pain than it  is ok to stop all together.  Plan to wean off of opioids This plan should start within one week post op of your joint replacement. Maintain the same interval or time between taking each dose and first decrease the dose.  Cut the total daily intake of opioids by one tablet each day Next start to increase the time between doses. The last dose that should be eliminated is the evening dose.          DISCHARGE MEDICATIONS:   Allergies as of 09/22/2021       Reactions   Oxycodone Nausea Only   Penicillins Other (See Comments)  Lip/ face tingling   Codeine Rash   Hydrocortisone Nausea Only   Sulfa Antibiotics Rash        Medication List     TAKE these medications    acetaminophen 500 MG tablet Commonly known as: TYLENOL Take 1-2 tablets (500-1,000 mg total) by mouth every 6 (six) hours as needed. What changed:  how much to take reasons to take this   amLODipine 5 MG tablet Commonly known as: NORVASC Take 5 mg by mouth in the morning.   aspirin 325 MG EC tablet Take 1 tablet (325 mg total) by mouth 2 (two) times daily. What changed: when to take this   atorvastatin 40 MG tablet Commonly known as: LIPITOR Take 40 mg by mouth at bedtime.   cyanocobalamin 1000 MCG/ML injection Commonly known as: (VITAMIN B-12) Inject 1,000 mcg into the muscle every 30 (thirty) days.   escitalopram 10 MG tablet Commonly known as: LEXAPRO Take 10 mg by mouth at bedtime.   FLINTSTONES PLUS IRON PO Take 1 tablet by mouth in the morning.   furosemide 40 MG tablet Commonly known as: LASIX Take 40 mg by mouth in the morning.   labetalol 200 MG tablet Commonly known as: NORMODYNE Take 200 mg by mouth 2 (two) times daily.   losartan 100 MG tablet Commonly known as: COZAAR Take 100 mg by mouth at bedtime.   pantoprazole 40 MG tablet Commonly known as: PROTONIX Take 40 mg by mouth in the morning.   traMADol 50 MG tablet Commonly known as: ULTRAM Take 0.5-1 tablets (25-50 mg  total) by mouth every 6 (six) hours. ONLY if pain not controlled with tylenol   Vitamin D-3 125 MCG (5000 UT) Tabs Take 5,000 Units by mouth in the morning.               Durable Medical Equipment  (From admission, onward)           Start     Ordered   09/18/21 1326  DME Walker rolling  Once       Question:  Patient needs a walker to treat with the following condition  Answer:  S/P total knee replacement   09/18/21 1326   09/18/21 1326  DME 3 n 1  Once        09/18/21 1326   09/18/21 1326  DME Bedside commode  Once       Question:  Patient needs a bedside commode to treat with the following condition  Answer:  S/P total knee replacement   09/18/21 1326            FOLLOW UP VISIT:    DISPOSITION: HOME VS. SNF  Dental Antibiotics:  In most cases prophylactic antibiotics for Dental procdeures after total joint surgery are not necessary.  Exceptions are as follows:  1. History of prior total joint infection  2. Severely immunocompromised (Organ Transplant, cancer chemotherapy, Rheumatoid biologic meds such as [place])  3. Poorly controlled diabetes (A1C &gt; 8.0, blood glucose over 200)  If you have one of these conditions, contact your surgeon for an antibiotic prescription, prior to your dental procedure.   CONDITION:  Good   Donia Ast 09/22/2021, 7:38 AM

## 2021-09-22 NOTE — Plan of Care (Signed)
  Problem: Education: Goal: Knowledge of General Education information will improve Description Including pain rating scale, medication(s)/side effects and non-pharmacologic comfort measures Outcome: Progressing   

## 2021-09-22 NOTE — Progress Notes (Signed)
SPORTS MEDICINE AND JOINT REPLACEMENT  Colleen Mulch, MD    Carlyon Shadow, PA-C [place], [place], [place]  [address]                             [phone]   PROGRESS NOTE  Subjective:  negative for Chest Pain  negative for Shortness of Breath  negative for Nausea/Vomiting   negative for Calf Pain  Positive for Bowel Movement   Tolerating Diet: yes         Patient reports pain as 4 on 0-10 scale.    Objective: Vital signs in last 24 hours:   Patient Vitals for the past 24 hrs:  BP Temp Temp src Pulse Resp SpO2  09/22/21 0538 (!) 144/57 97.7 F (36.5 C) Oral 73 16 91 %  09/21/21 2047 138/63 98.2 F (36.8 C) Oral 79 16 94 %  09/21/21 1316 (!) 153/66 97.8 F (36.6 C) -- 70 18 93 %    @flow {1959:LAST@   Intake/Output from previous day:   02/09 0701 - 02/10 0700 In: 1200 [P.O.:1200] Out: 552 [Urine:552]   Intake/Output this shift:   No intake/output data recorded.   Intake/Output      02/09 0701 02/10 0700 02/10 0701 02/11 0700   P.O. 1200    I.V. (mL/kg) 0 (0)    IV Piggyback 0    Total Intake(mL/kg) 1200 (18.9)    Urine (mL/kg/hr) 552 (0.4)    Total Output 552    Net +648         Urine Occurrence 3 x       LABORATORY DATA: Recent Labs    09/19/21 0342 09/20/21 0323 09/21/21 0313  WBC 8.5 9.3 7.7  HGB 11.2* 9.9* 9.7*  HCT 35.2* 30.7* 29.8*  PLT 122* 112* 114*   Recent Labs    09/19/21 0342  NA 139  K 3.9  CL 109  CO2 23  BUN 21  CREATININE 0.76  GLUCOSE 125*  CALCIUM 9.3   No results found for: INR, PROTIME  Examination:  General appearance: alert, cooperative, and no distress Extremities: extremities normal, atraumatic, no cyanosis or edema  Wound Exam: clean, dry, intact   Drainage:  None: wound tissue dry  Motor Exam: Quadriceps and Hamstrings Intact  Sensory Exam: Superficial Peroneal, Deep Peroneal, and Tibial normal   Assessment:    4 Days Post-Op  Procedure(s) (LRB): TOTAL KNEE ARTHROPLASTY  (Left)  ADDITIONAL DIAGNOSIS:  Principal Problem:   S/P total knee replacement Active Problems:   S/P TKR (total knee replacement) using cement, left     Plan: Physical Therapy as ordered Weight Bearing as Tolerated (WBAT)  DVT Prophylaxis:  Aspirin  DISCHARGE PLAN: Home  Plan has changed to go  to SNF, FL2 signed and RXs printed. Patient ready when bed available      Anticipated LOS equal to or greater than 2 midnights due to - Age 86 and older with one or more of the following:  - Obesity  - Expected need for hospital services (PT, OT, Nursing) required for safe  discharge  - Anticipated need for postoperative skilled nursing care or inpatient rehab    Donia Ast 09/22/2021, 7:33 AM

## 2021-09-22 NOTE — TOC Transition Note (Signed)
Transition of Care [facility]) - CM/SW Discharge Note  Patient Details  Name: Colleen Watkins MRN: [id number] Date of Birth: [birth year]  [facility] CM/SW Contact:  Colleen Don, LCSW Phone Number: 09/22/2021, 10:58 AM  Clinical Narrative: Patient's family is requesting [facility]. CSW contacted Olivia Mackie with [facility] to see if a bed offer could be made. [facility] made bed offer. The number for report is [phone] ext. [phone].  CSW updated daughter, Colleen Watkins. CSW completed insurance authorization on [employer] portal. Reference ID # is: [id number]. Patient has been approved for 09/22/2021-09/26/2021. Discharge summary, discharge orders, and SNF transfer report faxed to facility in hub.  Medical necessity form done; PTAR scheduled. Discharge packet completed. CSW updated RN. TOC signing off.  Final next level of care: Skilled Nursing Facility Barriers to Discharge: Barriers Resolved  Patient Goals and CMS Choice Patient states their goals for this hospitalization and ongoing recovery are:: Go to rehab CMS Medicare.gov Compare Post Acute Care list provided to:: Patient Represenative (must comment) Choice offered to / list presented to : Adult Children  Discharge Placement Existing PASRR number confirmed : 09/21/21          Patient chooses bed at: [facility] [place] Patient to be transferred to facility by: [facility] Name of family member notified: Colleen Watkins (daughter) Patient and family notified of of transfer: 09/22/21  Discharge Plan and Services Post Acute Care Choice: [facility]          DME Arranged: N/A DME Agency: NA  Readmission Risk Interventions No flowsheet data found.

## 2021-09-22 NOTE — Progress Notes (Signed)
Physical Therapy Treatment Patient Details Name: Colleen Watkins MRN: [id number] DOB: 11/03/1932 Today's Date: 09/22/2021   History of Present Illness Colleen Watkins is an 88 yo female S/P L TKA on 09/18/2021. PMH: Stroke, HTN, Pericardial effusion, CKD, lumbar laminectomy, CAD. Post op lethargy    PT Comments    POD # 4 General Comments: AxO x 3 Improving now that only taking Tylenol.  Daughter present during session.  Pt plans to D/C to [facility] in [place] later today.  Assisted OOB to amb to bathroom.  General transfer comment: Min Assist off elevated bed but then Max Assist from lower toilet level.  Initial posterior LOB.  Unsteady as well esp with turns.  General Gait Details: assisted with amb to and from bathroom 11 feet x 2 very slow gait with forward flex posture.  Assisted with peri care, static standing at sink to wash hands/brush teeth.  increased time and rest breaks between.    Very slow gait. Positioned in recliner to comfort and applied ICE.    Recommendations for follow up therapy are one component of a multi-disciplinary discharge planning process, led by the attending physician.  Recommendations may be updated based on patient status, additional functional criteria and insurance authorization.  Follow Up Recommendations  Skilled nursing-short term rehab (<3 hours/day)     Assistance Recommended at Discharge Frequent or constant Supervision/Assistance  Patient can return home with the following A lot of help with bathing/dressing/bathroom;Assistance with cooking/housework;Assist for transportation;A lot of help with walking and/or transfers;Help with stairs or ramp for entrance;Direct supervision/assist for medications management   Equipment Recommendations  None recommended by PT    Recommendations for Other Services       Precautions / Restrictions Precautions Precautions: Fall;Knee Precaution Comments: no pillow under knee Restrictions Weight Bearing Restrictions: No LLE  Weight Bearing: Weight bearing as tolerated     Mobility  Bed Mobility Overal bed mobility: Needs Assistance Bed Mobility: Supine to Sit     Supine to sit: Min assist     General bed mobility comments: assist legs and trunk as well as scooting using bed pad.  Increased time.    Transfers Overall transfer level: Needs assistance Equipment used: Rolling walker (2 wheels) Transfers: Sit to/from Stand Sit to Stand: Min assist, Min guard, Mod assist Stand pivot transfers: Mod assist         General transfer comment: Min Assist off elevated bed but then Max Assist from lower toilet level.  Initial posterior LOB.  Unsteady as well esp with turns.    Ambulation/Gait Ambulation/Gait assistance: Min assist, +2 safety/equipment Gait Distance (Feet): 22 Feet Assistive device: Rolling walker (2 wheels) Gait Pattern/deviations: Step-to pattern, Step-through pattern, Trunk flexed Gait velocity: decreased     General Gait Details: assisted with amb to and from bathroom 11 feet x 2 very slow gait with forward flex posture.  Increased time.  Very slow gait.   Stairs             Wheelchair Mobility    Modified Rankin (Stroke Patients Only)       Balance                                            Cognition Arousal/Alertness: Awake/alert Behavior During Therapy: WFL for tasks assessed/performed Overall Cognitive Status: Within Functional Limits for tasks assessed  General Comments: AxO x 3 Improving now that only taking Tylenol        Exercises      General Comments        Pertinent Vitals/Pain Pain Assessment Pain Assessment: 0-10 Pain Score: 5  Pain Location: left knee with activity Pain Descriptors / Indicators: Grimacing, Discomfort, Operative site guarding Pain Intervention(s): Monitored during session, Premedicated before session, Repositioned, Ice applied    Home Living                           Prior Function            PT Goals (current goals can now be found in the care plan section) Progress towards PT goals: Progressing toward goals    Frequency    7X/week      PT Plan Current plan remains appropriate    Co-evaluation              AM-PAC PT "6 Clicks" Mobility   Outcome Measure  Help needed turning from your back to your side while in a flat bed without using bedrails?: A Lot Help needed moving from lying on your back to sitting on the side of a flat bed without using bedrails?: A Lot Help needed moving to and from a bed to a chair (including a wheelchair)?: A Lot Help needed standing up from a chair using your arms (e.g., wheelchair or bedside chair)?: A Lot Help needed to walk in hospital room?: A Lot Help needed climbing 3-5 steps with a railing? : Total 6 Click Score: 11    End of Session Equipment Utilized During Treatment: Gait belt Activity Tolerance: Patient tolerated treatment well Patient left: in chair;with call bell/phone within reach;with family/visitor present Nurse Communication: Mobility status PT Visit Diagnosis: Unsteadiness on feet (R26.81);Difficulty in walking, not elsewhere classified (R26.2);Pain Pain - Right/Left: Left Pain - part of body: Knee     Time: 1405-1430 PT Time Calculation (min) (ACUTE ONLY): 25 min  Charges:  $Gait Training: 8-22 mins $Therapeutic Activity: 8-22 mins                     Rica Koyanagi  PTA Acute  Rehabilitation Services Pager      [phone] Office      [phone]

## 2021-09-22 NOTE — Progress Notes (Signed)
Called report to Lorelee Market, LPN at [facility] in [place].

## 2021-12-27 ENCOUNTER — Encounter: Payer: Self-pay | Admitting: Cardiology

## 2021-12-27 ENCOUNTER — Ambulatory Visit: Payer: Medicare PPO | Admitting: Cardiology

## 2021-12-27 VITALS — BP 120/58 | HR 69 | Ht 61.0 in | Wt 141.0 lb

## 2021-12-27 DIAGNOSIS — I3139 Other pericardial effusion (noninflammatory): Secondary | ICD-10-CM | POA: Diagnosis not present

## 2021-12-27 DIAGNOSIS — I2510 Atherosclerotic heart disease of native coronary artery without angina pectoris: Secondary | ICD-10-CM | POA: Diagnosis not present

## 2021-12-27 DIAGNOSIS — R0609 Other forms of dyspnea: Secondary | ICD-10-CM

## 2021-12-27 DIAGNOSIS — I10 Essential (primary) hypertension: Secondary | ICD-10-CM

## 2021-12-27 NOTE — Patient Instructions (Signed)

## 2021-12-27 NOTE — Progress Notes (Signed)
?Cardiology Office Note:   ? ?Date:  12/27/2021  ? ?ID:  Colleen Watkins, DOB Mar 21, 1933, MRN [id number] ? ?PCP:  Mayer Camel, NP  ?Cardiologist:  Jenne Campus, MD   ? ?Referring MD: Bess Harvest*  ? ?Chief Complaint  ?Patient presents with  ? Follow-up  ? ? ?History of Present Illness:   ? ?Colleen Watkins is a 88 y.o. female with past medical history significant for CVA that she suffered from many years ago recovered completely, history of pericardial effusion which was never hemodynamically significant, essential hypertension, sleep apnea, coronary artery disease in form of calcification of the coronary arteries on the CT, stress test done last year showed no evidence of ischemia.  Last time I have seen her she we were talking about cardiovascular evaluation before elective left knee replacement surgery.  Stress test being done showed no evidence of ischemia, procedure done after that meeting knee replacement surgery was successful and she is doing well.  She comes today to my office for follow-up overall no problem denies of any chest pain tightness squeezing pressure burning chest in the matter of fact she is quite cheerful and happy she is here with her daughter. ? ?Past Medical History:  ?Diagnosis Date  ? Abdominal pain 06/29/2019  ? Anemia 07/01/2020  ? Anxiety   ? Arthritis   ? Cancer [facility])   ? skin  ? Chronic pain of left knee 01/21/2017  ? Complication of anesthesia   ? Coronary artery disease involving native coronary artery of native heart without angina pectoris 05/04/2015  ? No symptoms, calcification noted the CT  ? Cyst of ovary 06/29/2019  ? Dyspnea on exertion 01/28/2019  ? GERD (gastroesophageal reflux disease)   ? occ  ? History of GI bleed 05/04/2015  ? History of stroke 08/25/2020  ? Formatting of this note might be different from the original. Left side of pons  ? HTN (hypertension) 04/21/2013  ? Hypertension   ? Late effect of cerebrovascular accident (CVA) 08/31/2020   ? Malaise and fatigue 06/30/2020  ? Obstructive sleep apnea syndrome 06/30/2020  ? Pericardial effusion 05/04/2015  ? Moderate by last echocardiogram  ? Pes anserinus bursitis of right knee 12/29/2019  ? PONV (postoperative nausea and vomiting)   ? Recurrent UTI 09/28/2020  ? S/P lumbar laminectomy 02/17/2015  ? S/P TKR (total knee replacement) using cement, left 09/20/2021  ? Stroke ([place]) 08/13/2020  ? Swelling of both lower extremities 01/28/2019  ? Trochanteric bursitis of right hip 12/29/2019  ? UTI (urinary tract infection)   ? ? ?Past Surgical History:  ?Procedure Laterality Date  ? ANKLE FRACTURE SURGERY Right   ? 25 yrs ago  ? APPENDECTOMY    ? BREAST SURGERY Left 2011  ? removed nodule-benign  ? FOOT SURGERY Right   ? bunion 20 yrs ago  ? KNEE ARTHROSCOPY Left 2000  ? LAPAROTOMY  50  ? intestinal tortion  ? LUMBAR LAMINECTOMY/DECOMPRESSION MICRODISCECTOMY N/A 06/04/2013  ? Procedure: LUMBAR LAMINECTOMY/DECOMPRESSION MICRODISCECTOMY 1 LEVEL;  Surgeon: Eustace Moore, MD;  Location: [place] [facility];  Service: Neurosurgery;  Laterality: N/A;  LUMBAR LAMINECTOMY/DECOMPRESSION MICRODISCECTOMY 1 LEVEL  ? LUMBAR LAMINECTOMY/DECOMPRESSION MICRODISCECTOMY Left 02/17/2015  ? Procedure: LUMBAR LAMINECTOMY/DECOMPRESSION MICRODISCECTOMY LUMBAR THREE FOUR;  Surgeon: Eustace Moore, MD;  Location: [place] [facility];  Service: Neurosurgery;  Laterality: Left;  ? TOTAL KNEE ARTHROPLASTY Left 09/18/2021  ? Procedure: TOTAL KNEE ARTHROPLASTY;  Surgeon: Vickey Huger, MD;  Location: [facility];  Service: Orthopedics;  Laterality: Left;  ? ? ?  Current Medications: ?Current Meds  ?Medication Sig  ? acetaminophen (TYLENOL) 500 MG tablet Take 1-2 tablets (500-1,000 mg total) by mouth every 6 (six) hours as needed. (Patient taking differently: Take 500-1,000 mg by mouth every 6 (six) hours as needed for mild pain or moderate pain.)  ? amLODipine (NORVASC) 5 MG tablet Take 5 mg by mouth in the morning.  ? aspirin EC 325 MG EC tablet Take 1 tablet (325  mg total) by mouth 2 (two) times daily.  ? atorvastatin (LIPITOR) 40 MG tablet Take 40 mg by mouth at bedtime.  ? Cholecalciferol (VITAMIN D-3) 125 MCG (5000 UT) TABS Take 5,000 Units by mouth in the morning.  ? cyanocobalamin (,VITAMIN B-12,) 1000 MCG/ML injection Inject 1,000 mcg into the muscle every 30 (thirty) days.  ? escitalopram (LEXAPRO) 10 MG tablet Take 10 mg by mouth at bedtime.  ? furosemide (LASIX) 40 MG tablet Take 40 mg by mouth in the morning.  ? labetalol (NORMODYNE) 200 MG tablet Take 200 mg by mouth 2 (two) times daily.  ? naproxen sodium (ALEVE) 220 MG tablet Take 220 mg by mouth daily as needed (pain).  ? pantoprazole (PROTONIX) 40 MG tablet Take 40 mg by mouth in the morning.  ? Pediatric Multivitamins-Iron (FLINTSTONES PLUS IRON PO) Take 1 tablet by mouth in the morning.  ? traMADol (ULTRAM) 50 MG tablet Take 0.5-1 tablets (25-50 mg total) by mouth every 6 (six) hours. ONLY if pain not controlled with tylenol  ?  ? ?Allergies:   Oxycodone, Penicillins, Codeine, Hydrocortisone, and Sulfa antibiotics  ? ?Social History  ? ?Socioeconomic History  ? Marital status: Widowed  ?  Spouse name: Not on file  ? Number of children: Not on file  ? Years of education: Not on file  ? Highest education level: Not on file  ?Occupational History  ? Not on file  ?Tobacco Use  ? Smoking status: Never  ? Smokeless tobacco: Never  ?Vaping Use  ? Vaping Use: Never used  ?Substance and Sexual Activity  ? Alcohol use: No  ? Drug use: No  ? Sexual activity: Not on file  ?Other Topics Concern  ? Not on file  ?Social History Narrative  ? Not on file  ? ?Social Determinants of Health  ? ?Financial Resource Strain: Not on file  ?Food Insecurity: Not on file  ?Transportation Needs: Not on file  ?Physical Activity: Not on file  ?Stress: Not on file  ?Social Connections: Not on file  ?  ? ?Family History: ?The patient's family history includes Breast cancer in her daughter; Diabetes in her sister; Ovarian cancer in her  mother. ?ROS:   ?Please see the history of present illness.    ?All 14 point review of systems negative except as described per history of present illness ? ?EKGs/Labs/Other Studies Reviewed:   ? ? ? ?Recent Labs: ?09/14/2021: ALT 18 ?09/19/2021: BUN 21; Creatinine, Ser 0.76; Potassium 3.9; Sodium 139 ?09/21/2021: Hemoglobin 9.7; Platelets 114  ?Recent Lipid Panel ?No results found for: CHOL, TRIG, HDL, CHOLHDL, VLDL, LDLCALC, LDLDIRECT ? ?Physical Exam:   ? ?VS:  BP (!) 120/58 (BP Location: Left Arm, Patient Position: Sitting)   Pulse 69   Ht '5\' 1"'$  (1.549 m)   Wt 141 lb (64 kg)   SpO2 96%   BMI 26.64 kg/m?    ? ?Wt Readings from Last 3 Encounters:  ?12/27/21 141 lb (64 kg)  ?09/18/21 140 lb (63.5 kg)  ?09/14/21 140 lb (63.5 kg)  ?  ? ?  GEN:  Well nourished, well developed in no acute distress ?HEENT: Normal ?NECK: No JVD; No carotid bruits ?LYMPHATICS: No lymphadenopathy ?CARDIAC: RRR, no murmurs, no rubs, no gallops ?RESPIRATORY:  Clear to auscultation without rales, wheezing or rhonchi  ?ABDOMEN: Soft, non-tender, non-distended ?MUSCULOSKELETAL:  No edema; No deformity  ?SKIN: Warm and dry ?LOWER EXTREMITIES: no swelling ?NEUROLOGIC:  Alert and oriented x 3 ?PSYCHIATRIC:  Normal affect  ? ?ASSESSMENT:   ? ?1. Coronary artery disease involving native coronary artery of native heart without angina pectoris   ?2. Primary hypertension   ?3. Pericardial effusion   ?4. Dyspnea on exertion   ? ?PLAN:   ? ?In order of problems listed above: ? ?Coronary disease stable from that point review stress test review negative continue present management. ?Essential hypertension blood pressure well controlled continue present management. ?Dyslipidemia I did review data done by primary care physician in March her LDL was 44.  Continue present management ?History of pericardial effusion never hemodynamically significant.  We will keep monitoring hemodynamically. ? ? ?Medication Adjustments/Labs and Tests Ordered: ?Current medicines  are reviewed at length with the patient today.  Concerns regarding medicines are outlined above.  ?No orders of the defined types were placed in this encounter. ? ?Medication changes: No orders of the defined type

## 2022-01-22 ENCOUNTER — Encounter: Payer: Self-pay | Admitting: Cardiology

## 2022-01-22 NOTE — Telephone Encounter (Signed)
error 

## 2022-08-24 ENCOUNTER — Encounter: Payer: Self-pay | Admitting: Cardiology

## 2022-08-24 ENCOUNTER — Ambulatory Visit: Payer: Medicare PPO | Attending: Cardiology | Admitting: Cardiology

## 2022-08-24 VITALS — BP 110/60 | HR 61 | Ht 61.0 in | Wt 130.0 lb

## 2022-08-24 DIAGNOSIS — I6930 Unspecified sequelae of cerebral infarction: Secondary | ICD-10-CM

## 2022-08-24 DIAGNOSIS — I2510 Atherosclerotic heart disease of native coronary artery without angina pectoris: Secondary | ICD-10-CM | POA: Diagnosis not present

## 2022-08-24 DIAGNOSIS — I10 Essential (primary) hypertension: Secondary | ICD-10-CM | POA: Diagnosis not present

## 2022-08-24 DIAGNOSIS — N1832 Chronic kidney disease, stage 3b: Secondary | ICD-10-CM | POA: Diagnosis not present

## 2022-08-24 NOTE — Progress Notes (Signed)
error 

## 2022-08-24 NOTE — Progress Notes (Signed)
Cardiology Office Note:    Date:  08/24/2022   ID:  Colleen Watkins, DOB 08-May-1933, MRN [id number]  PCP:  Mayer Camel, NP  Cardiologist:  Jenne Campus, MD    Referring MD: Bess Harvest*   Chief Complaint  Patient presents with   Follow-up    History of Present Illness:    Colleen Watkins is a 89 y.o. female  with past medical history significant for CVA that she suffered from many years ago recovered completely, history of pericardial effusion which was never hemodynamically significant, essential hypertension, sleep apnea, coronary artery disease in form of calcification of the coronary arteries on the CT, stress test done last year showed no evidence of ischemia. Last time I have seen her she we were talking about cardiovascular evaluation before elective left knee replacement surgery. Stress test being done showed no evidence of ischemia, procedure done after that meeting knee replacement surgery was successful and she is doing well  Comes today to my office for follow-up.  Overall doing well.  She did have some swelling of lower extremities but improved to gone.  Past Medical History:  Diagnosis Date   Abdominal pain 06/29/2019   Anemia 07/01/2020   Anxiety    Arthritis    Cancer ([place])    skin   Chronic pain of left knee 00/86/7619   Complication of anesthesia    Coronary artery disease involving native coronary artery of native heart without angina pectoris 05/04/2015   No symptoms, calcification noted the CT   Cyst of ovary 06/29/2019   Dyspnea on exertion 01/28/2019   GERD (gastroesophageal reflux disease)    occ   History of GI bleed 05/04/2015   History of stroke 08/25/2020   Formatting of this note might be different from the original. Left side of pons   HTN (hypertension) 04/21/2013   Hypertension    Late effect of cerebrovascular accident (CVA) 08/31/2020   Malaise and fatigue 06/30/2020   Obstructive sleep apnea syndrome 06/30/2020    Pericardial effusion 05/04/2015   Moderate by last echocardiogram   Pes anserinus bursitis of right knee 12/29/2019   PONV (postoperative nausea and vomiting)    Recurrent UTI 09/28/2020   S/P lumbar laminectomy 02/17/2015   S/P TKR (total knee replacement) using cement, left 09/20/2021   Stroke ([place]) 08/13/2020   Swelling of both lower extremities 01/28/2019   Trochanteric bursitis of right hip 12/29/2019   UTI (urinary tract infection)     Past Surgical History:  Procedure Laterality Date   ANKLE FRACTURE SURGERY Right    25 yrs ago   APPENDECTOMY     BREAST SURGERY Left 2011   removed nodule-benign   FOOT SURGERY Right    bunion 20 yrs ago   KNEE ARTHROSCOPY Left 2000   LAPAROTOMY  50   intestinal tortion   LUMBAR LAMINECTOMY/DECOMPRESSION MICRODISCECTOMY N/A 06/04/2013   Procedure: LUMBAR LAMINECTOMY/DECOMPRESSION MICRODISCECTOMY 1 LEVEL;  Surgeon: Eustace Moore, MD;  Location: [facility];  Service: Neurosurgery;  Laterality: N/A;  LUMBAR LAMINECTOMY/DECOMPRESSION MICRODISCECTOMY 1 LEVEL   LUMBAR LAMINECTOMY/DECOMPRESSION MICRODISCECTOMY Left 02/17/2015   Procedure: LUMBAR LAMINECTOMY/DECOMPRESSION MICRODISCECTOMY LUMBAR THREE FOUR;  Surgeon: Eustace Moore, MD;  Location: [place] [facility];  Service: Neurosurgery;  Laterality: Left;   TOTAL KNEE ARTHROPLASTY Left 09/18/2021   Procedure: TOTAL KNEE ARTHROPLASTY;  Surgeon: Vickey Huger, MD;  Location: [facility];  Service: Orthopedics;  Laterality: Left;    Current Medications: Current Meds  Medication Sig   acetaminophen (TYLENOL) 500 MG  tablet Take 1-2 tablets (500-1,000 mg total) by mouth every 6 (six) hours as needed. (Patient taking differently: Take 500-1,000 mg by mouth every 6 (six) hours as needed for mild pain or moderate pain.)   amLODipine (NORVASC) 5 MG tablet Take 5 mg by mouth in the morning.   aspirin EC 325 MG EC tablet Take 1 tablet (325 mg total) by mouth 2 (two) times daily.   atorvastatin (LIPITOR) 40 MG tablet Take  40 mg by mouth at bedtime.   Cholecalciferol (VITAMIN D-3) 125 MCG (5000 UT) TABS Take 5,000 Units by mouth in the morning.   cyanocobalamin (,VITAMIN B-12,) 1000 MCG/ML injection Inject 1,000 mcg into the muscle every 30 (thirty) days.   escitalopram (LEXAPRO) 10 MG tablet Take 10 mg by mouth at bedtime.   furosemide (LASIX) 40 MG tablet Take 40 mg by mouth in the morning.   labetalol (NORMODYNE) 200 MG tablet Take 200 mg by mouth 2 (two) times daily.   losartan (COZAAR) 100 MG tablet Take 100 mg by mouth at bedtime.   pantoprazole (PROTONIX) 40 MG tablet Take 40 mg by mouth in the morning.     Allergies:   Oxycodone, Penicillins, Codeine, Hydrocortisone, and Sulfa antibiotics   Social History   Socioeconomic History   Marital status: Widowed    Spouse name: Not on file   Number of children: Not on file   Years of education: Not on file   Highest education level: Not on file  Occupational History   Not on file  Tobacco Use   Smoking status: Never   Smokeless tobacco: Never  Vaping Use   Vaping Use: Never used  Substance and Sexual Activity   Alcohol use: No   Drug use: No   Sexual activity: Not on file  Other Topics Concern   Not on file  Social History Narrative   Not on file   Social Determinants of Health   Financial Resource Strain: Not on file  Food Insecurity: Not on file  Transportation Needs: Not on file  Physical Activity: Not on file  Stress: Not on file  Social Connections: Not on file     Family History: The patient's family history includes Breast cancer in her daughter; Diabetes in her sister; Ovarian cancer in her mother. ROS:   Please see the history of present illness.    All 14 point review of systems negative except as described per history of present illness  EKGs/Labs/Other Studies Reviewed:      Recent Labs: 09/14/2021: ALT 18 09/19/2021: BUN 21; Creatinine, Ser 0.76; Potassium 3.9; Sodium 139 09/21/2021: Hemoglobin 9.7; Platelets 114   Recent Lipid Panel No results found for: "CHOL", "TRIG", "HDL", "CHOLHDL", "VLDL", "LDLCALC", "LDLDIRECT"  Physical Exam:    VS:  BP 110/60 (BP Location: Left Arm, Patient Position: Sitting, Cuff Size: Normal)   Pulse 61   Ht '5\' 1"'$  (1.549 m)   Wt 130 lb (59 kg)   SpO2 95%   BMI 24.56 kg/m     Wt Readings from Last 3 Encounters:  08/24/22 130 lb (59 kg)  12/27/21 141 lb (64 kg)  09/18/21 140 lb (63.5 kg)     GEN:  Well nourished, well developed in no acute distress HEENT: Normal NECK: No JVD; No carotid bruits LYMPHATICS: No lymphadenopathy CARDIAC: RRR, no murmurs, no rubs, no gallops RESPIRATORY:  Clear to auscultation without rales, wheezing or rhonchi  ABDOMEN: Soft, non-tender, non-distended MUSCULOSKELETAL:  No edema; No deformity  SKIN: Warm and dry LOWER  EXTREMITIES: no swelling NEUROLOGIC:  Alert and oriented x 3 PSYCHIATRIC:  Normal affect   ASSESSMENT:    1. Coronary artery disease involving native coronary artery of native heart without angina pectoris   2. Primary hypertension   3. Stage 3b chronic kidney disease ([place])   4. Late effect of cerebrovascular accident (CVA)    PLAN:    In order of problems listed above:  Coronary artery disease doing well from that point review, denies have any chest pain tightness squeezing pressure burning chest. Essential hypertension, blood pressure well-controlled continue present management. Dyslipidemia I did review K PN which show me her LDL of 92 HDL 62 however this is data from 2020, I was able to find another test done in September 2023 which show LDL of 49 good cholesterol management continue present management. Swelling of lower extremities I will check proBNP and Chem-7.   Medication Adjustments/Labs and Tests Ordered: Current medicines are reviewed at length with the patient today.  Concerns regarding medicines are outlined above.  Orders Placed This Encounter  Procedures   EKG 12-Lead   Medication  changes: No orders of the defined types were placed in this encounter.   Signed, Park Liter, MD, [facility] 08/24/2022 11:10 AM    [place]

## 2022-08-24 NOTE — Patient Instructions (Signed)
Medication Instructions:  Your physician recommends that you continue on your current medications as directed. Please refer to the Current Medication list given to you today.  *If you need a refill on your cardiac medications before your next appointment, please call your pharmacy*   Lab Work: Your physician recommends that you return for lab work in:   Labs today: Pro BNP, BMP  If you have labs (blood work) drawn today and your tests are completely normal, you will receive your results only by: MyChart Message (if you have MyChart) OR A paper copy in the mail If you have any lab test that is abnormal or we need to change your treatment, we will call you to review the results.   Testing/Procedures: None   Follow-Up: At [facility], you and your health needs are our priority.  As part of our continuing mission to provide you with exceptional heart care, we have created designated Provider Care Teams.  These Care Teams include your primary Cardiologist (physician) and Advanced Practice Providers (APPs -  Physician Assistants and Nurse Practitioners) who all work together to provide you with the care you need, when you need it.  We recommend signing up for the patient portal called "MyChart".  Sign up information is provided on this After Visit Summary.  MyChart is used to connect with patients for Virtual Visits (Telemedicine).  Patients are able to view lab/test results, encounter notes, upcoming appointments, etc.  Non-urgent messages can be sent to your provider as well.   To learn more about what you can do with MyChart, go to [email or web address].    Your next appointment:   6 month(s)  Provider:   Jenne Campus, MD Other Instructions None

## 2022-08-24 NOTE — Addendum Note (Signed)
Addended by: Edwyna Shell I on: 08/24/2022 11:16 AM   Modules accepted: Orders

## 2022-08-25 LAB — BASIC METABOLIC PANEL
BUN/Creatinine Ratio: 23 (ref 12–28)
BUN: 28 mg/dL — ABNORMAL HIGH (ref 8–27)
CO2: 25 mmol/L (ref 20–29)
Calcium: 9.5 mg/dL (ref 8.7–10.3)
Chloride: 103 mmol/L (ref 96–106)
Creatinine, Ser: 1.21 mg/dL — ABNORMAL HIGH (ref 0.57–1.00)
Glucose: 97 mg/dL (ref 70–99)
Potassium: 4.7 mmol/L (ref 3.5–5.2)
Sodium: 144 mmol/L (ref 134–144)
eGFR: 43 mL/min/{1.73_m2} — ABNORMAL LOW (ref >59)

## 2022-08-25 LAB — PRO B NATRIURETIC PEPTIDE: NT-Pro BNP: 450 pg/mL (ref 0–738)

## 2022-08-28 ENCOUNTER — Telehealth: Payer: Self-pay

## 2022-08-28 DIAGNOSIS — I10 Essential (primary) hypertension: Secondary | ICD-10-CM

## 2022-08-28 NOTE — Telephone Encounter (Signed)
Spoke with daughter Santiago Glad per [place]- Per Dr. Wendy Poet note. Daughter verbalized understanding and had no further questions. Routed to PCP.

## 2022-08-28 NOTE — Telephone Encounter (Signed)
BMP lab order entered.

## 2022-10-16 LAB — LAB REPORT - SCANNED
A1c: 5.7
EGFR: 59

## 2022-10-17 DIAGNOSIS — D696 Thrombocytopenia, unspecified: Secondary | ICD-10-CM | POA: Insufficient documentation

## 2023-02-25 ENCOUNTER — Telehealth: Payer: Self-pay | Admitting: *Deleted

## 2023-02-25 ENCOUNTER — Ambulatory Visit: Payer: Medicare PPO | Attending: General Practice

## 2023-02-25 DIAGNOSIS — Z01810 Encounter for preprocedural cardiovascular examination: Secondary | ICD-10-CM | POA: Diagnosis not present

## 2023-02-25 DIAGNOSIS — K4090 Unilateral inguinal hernia, without obstruction or gangrene, not specified as recurrent: Secondary | ICD-10-CM | POA: Insufficient documentation

## 2023-02-25 NOTE — Telephone Encounter (Signed)
   Name: Myriam Brandhorst  DOB: [birth year]  MRN: [id number]  Primary Cardiologist: Gypsy Balsam, MD   Preoperative team, please contact this patient and set up a phone call appointment for further preoperative risk assessment. Please obtain consent and complete medication review. Thank you for your help.  I confirm that guidance regarding antiplatelet and oral anticoagulation therapy has been completed and, if necessary, noted below.  Patient's aspirin is not prescribed by cardiology.  Recommendations for holding aspirin will need to come from prescribing provider.   Ronney Asters, NP 02/25/2023, 1:26 PM [facility]

## 2023-02-25 NOTE — Telephone Encounter (Signed)
I s/w the pt's daughter [name], ([place]). Pt has been scheduled for tele pre op appt 3:20 today based on procedure date 02/27/23; our office just received clearance request today. Med rec and consent are done.

## 2023-02-25 NOTE — Telephone Encounter (Signed)
   Pre-operative Risk Assessment    Patient Name: Colleen Watkins  DOB: 08-09-1933 MRN: [id number]      Request for Surgical Clearance    Procedure:   LEFT INGUINAL HERNIA REPAIR  Date of Surgery:  Clearance 02/27/23                                 Surgeon:  DR. Georgiana Shore Surgeon's Group or Practice Name:  [facility] Phone number:  [phone] Fax number:  [phone]   Type of Clearance Requested:   - Medical ; ASA THOUGH NOT LISTED AS NEEDING TO BE HELD   Type of Anesthesia:  General    Additional requests/questions:    Elpidio Anis   02/25/2023, 1:02 PM

## 2023-02-25 NOTE — Progress Notes (Signed)
Virtual Visit via Telephone Note   Because of Colleen Watkins's co-morbid illnesses, she is at least at moderate risk for complications without adequate follow up.  This format is felt to be most appropriate for this patient at this time.  The patient did not have access to video technology/had technical difficulties with video requiring transitioning to audio format only (telephone).  All issues noted in this document were discussed and addressed.  No physical exam could be performed with this format.  Please refer to the patient's chart for her consent to telehealth for [facility].  Evaluation Performed:  Preoperative cardiovascular risk assessment _____________   Date:  02/25/2023   Patient ID:  Colleen Watkins, DOB 01/29/1933, MRN [id number] Patient Location:  Home Provider location:   Office  Primary Care Provider:  Krystal Clark, NP Primary Cardiologist:  Gypsy Balsam, MD  Chief Complaint / Patient Profile   89 y.o. y/o female with a h/o CVA, hypertension, coronary artery disease who is pending left inguinal hernia repair and presents today for telephonic preoperative cardiovascular risk assessment.  History of Present Illness    Colleen Watkins is a 89 y.o. female who presents via audio/video conferencing for a telehealth visit today.  Pt was last seen in cardiology clinic on 08/24/2022 by Dr. Bing Matter.  At that time Sydny Schnitzler was doing well .  The patient is now pending procedure as outlined above. Since her last visit, she continues to be stable from a cardiac standpoint.  Today she denies chest pain, shortness of breath, lower extremity edema, fatigue, palpitations, melena, hematuria, hemoptysis, diaphoresis, weakness, presyncope, syncope, orthopnea, and PND.   Past Medical History    Past Medical History:  Diagnosis Date   Abdominal pain 06/29/2019   Anemia 07/01/2020   Anxiety    Arthritis    Cancer (HCC)    skin   Chronic pain of left knee  01/21/2017   Complication of anesthesia    Coronary artery disease involving native coronary artery of native heart without angina pectoris 05/04/2015   No symptoms, calcification noted the CT   Cyst of ovary 06/29/2019   Dyspnea on exertion 01/28/2019   GERD (gastroesophageal reflux disease)    occ   History of GI bleed 05/04/2015   History of stroke 08/25/2020   Formatting of this note might be different from the original. Left side of pons   HTN (hypertension) 04/21/2013   Hypertension    Late effect of cerebrovascular accident (CVA) 08/31/2020   Malaise and fatigue 06/30/2020   Obstructive sleep apnea syndrome 06/30/2020   Pericardial effusion 05/04/2015   Moderate by last echocardiogram   Pes anserinus bursitis of right knee 12/29/2019   PONV (postoperative nausea and vomiting)    Recurrent UTI 09/28/2020   S/P lumbar laminectomy 02/17/2015   S/P TKR (total knee replacement) using cement, left 09/20/2021   Stroke (HCC) 08/13/2020   Swelling of both lower extremities 01/28/2019   Trochanteric bursitis of right hip 12/29/2019   UTI (urinary tract infection)    Past Surgical History:  Procedure Laterality Date   ANKLE FRACTURE SURGERY Right    25 yrs ago   APPENDECTOMY     BREAST SURGERY Left 2011   removed nodule-benign   FOOT SURGERY Right    bunion 20 yrs ago   KNEE ARTHROSCOPY Left 2000   LAPAROTOMY  50   intestinal tortion   LUMBAR LAMINECTOMY/DECOMPRESSION MICRODISCECTOMY N/A 06/04/2013   Procedure: LUMBAR LAMINECTOMY/DECOMPRESSION MICRODISCECTOMY 1 LEVEL;  Surgeon: Kermit Balo  Yetta Barre, MD;  Location: [facility];  Service: Neurosurgery;  Laterality: N/A;  LUMBAR LAMINECTOMY/DECOMPRESSION MICRODISCECTOMY 1 LEVEL   LUMBAR LAMINECTOMY/DECOMPRESSION MICRODISCECTOMY Left 02/17/2015   Procedure: LUMBAR LAMINECTOMY/DECOMPRESSION MICRODISCECTOMY LUMBAR THREE FOUR;  Surgeon: Tia Alert, MD;  Location: [facility];  Service: Neurosurgery;  Laterality: Left;   TOTAL KNEE  ARTHROPLASTY Left 09/18/2021   Procedure: TOTAL KNEE ARTHROPLASTY;  Surgeon: Dannielle Huh, MD;  Location: [facility];  Service: Orthopedics;  Laterality: Left;    Allergies  Allergies  Allergen Reactions   Oxycodone Nausea Only   Penicillins Other (See Comments)    Lip/ face tingling   Codeine Rash   Hydrocortisone Nausea Only   Sulfa Antibiotics Rash    Home Medications    Prior to Admission medications   Medication Sig Start Date End Date Taking? Authorizing Provider  amLODipine (NORVASC) 5 MG tablet Take 5 mg by mouth in the morning.    [provider]  aspirin EC 325 MG EC tablet Take 1 tablet (325 mg total) by mouth 2 (two) times daily. 09/22/21   Guy Sandifer, PA  atorvastatin (LIPITOR) 40 MG tablet Take 40 mg by mouth at bedtime.    [provider]  Cholecalciferol (VITAMIN D-3) 125 MCG (5000 UT) TABS Take 5,000 Units by mouth in the morning.    [provider]  cyanocobalamin (,VITAMIN B-12,) 1000 MCG/ML injection Inject 1,000 mcg into the muscle every 30 (thirty) days. 08/22/21   [provider]  escitalopram (LEXAPRO) 10 MG tablet Take 10 mg by mouth at bedtime.    [provider]  furosemide (LASIX) 40 MG tablet Take 40 mg by mouth in the morning.    [provider]  labetalol (NORMODYNE) 200 MG tablet Take 200 mg by mouth 2 (two) times daily.    [provider]  losartan (COZAAR) 100 MG tablet Take 100 mg by mouth at bedtime.    [provider]  pantoprazole (PROTONIX) 40 MG tablet Take 40 mg by mouth in the morning.    [provider]    Physical Exam    Vital Signs:  Graciana Sessa does not have vital signs available for review today.  Given telephonic nature of communication, physical exam is limited. AAOx3. NAD. Normal affect.  Speech and respirations are unlabored.  Accessory Clinical Findings    None  Assessment & Plan    1.  Preoperative Cardiovascular Risk Assessment: Left  inguinal hernia repair, Dr. Georgiana Shore, [facility], fax #[phone]      Primary Cardiologist: Gypsy Balsam, MD  Chart reviewed as part of pre-operative protocol coverage. Given past medical history and time since last visit, based on ACC/AHA guidelines, Hilliary Jock would be at acceptable risk for the planned procedure without further cardiovascular testing.    Her RCRI is a class IV risk, 11% risk of major cardiac event.  She is able to complete greater than 4 METS of physical activity. Patient's aspirin is not prescribed by cardiology. Recommendations for holding aspirin will need to come from prescribing provider.   Patient was advised that if she develops new symptoms prior to surgery to contact our office to arrange a follow-up appointment.  She verbalized understanding.      Time:   Today, I have spent 6 minutes with the patient with telehealth technology discussing medical history, symptoms, and management plan.  Prior to her phone evaluation I spent greater than 10 minutes reviewing her past medical history and cardiac medications.  Ronney Asters, NP  02/25/2023, 2:30 PM

## 2023-02-25 NOTE — Telephone Encounter (Signed)
I s/w the pt's daughter Colleen Braun, ([place]). Pt has been scheduled for tele pre op appt 3:20 today based on procedure date 02/27/23; our office just received clearance request today. Med rec and consent are done.      Patient Consent for Virtual Visit        Colleen Watkins has provided verbal consent on 02/25/2023 for a virtual visit (video or telephone).   CONSENT FOR VIRTUAL VISIT FOR:  Colleen Watkins  By participating in this virtual visit I agree to the following:  I hereby voluntarily request, consent and authorize [facility] and its employed or contracted physicians, physician assistants, nurse practitioners or other licensed health care professionals (the Practitioner), to provide me with telemedicine health care services (the "Services") as deemed necessary by the treating Practitioner. I acknowledge and consent to receive the Services by the Practitioner via telemedicine. I understand that the telemedicine visit will involve communicating with the Practitioner through live audiovisual communication technology and the disclosure of certain medical information by electronic transmission. I acknowledge that I have been given the opportunity to request an in-person assessment or other available alternative prior to the telemedicine visit and am voluntarily participating in the telemedicine visit.  I understand that I have the right to withhold or withdraw my consent to the use of telemedicine in the course of my care at any time, without affecting my right to future care or treatment, and that the Practitioner or I may terminate the telemedicine visit at any time. I understand that I have the right to inspect all information obtained and/or recorded in the course of the telemedicine visit and may receive copies of available information for a reasonable fee.  I understand that some of the potential risks of receiving the Services via telemedicine include:  Delay or interruption in medical  evaluation due to technological equipment failure or disruption; Information transmitted may not be sufficient (e.g. poor resolution of images) to allow for appropriate medical decision making by the Practitioner; and/or  In rare instances, security protocols could fail, causing a breach of personal health information.  Furthermore, I acknowledge that it is my responsibility to provide information about my medical history, conditions and care that is complete and accurate to the best of my ability. I acknowledge that Practitioner's advice, recommendations, and/or decision may be based on factors not within their control, such as incomplete or inaccurate data provided by me or distortions of diagnostic images or specimens that may result from electronic transmissions. I understand that the practice of medicine is not an exact science and that Practitioner makes no warranties or guarantees regarding treatment outcomes. I acknowledge that a copy of this consent can be made available to me via my patient portal [facility] MyChart), or I can request a printed copy by calling the office of [facility].    I understand that my insurance will be billed for this visit.   I have read or had this consent read to me. I understand the contents of this consent, which adequately explains the benefits and risks of the Services being provided via telemedicine.  I have been provided ample opportunity to ask questions regarding this consent and the Services and have had my questions answered to my satisfaction. I give my informed consent for the services to be provided through the use of telemedicine in my medical care

## 2023-03-08 DIAGNOSIS — Z8719 Personal history of other diseases of the digestive system: Secondary | ICD-10-CM | POA: Insufficient documentation

## 2023-03-12 DIAGNOSIS — Z09 Encounter for follow-up examination after completed treatment for conditions other than malignant neoplasm: Secondary | ICD-10-CM | POA: Insufficient documentation

## 2023-04-26 ENCOUNTER — Encounter: Payer: Self-pay | Admitting: Cardiology

## 2023-04-29 ENCOUNTER — Encounter: Payer: Self-pay | Admitting: Cardiology

## 2023-04-29 ENCOUNTER — Ambulatory Visit: Payer: Medicare PPO | Attending: Cardiology | Admitting: Cardiology

## 2023-04-29 VITALS — BP 126/60 | HR 82 | Ht 62.0 in | Wt 132.4 lb

## 2023-04-29 DIAGNOSIS — I2510 Atherosclerotic heart disease of native coronary artery without angina pectoris: Secondary | ICD-10-CM

## 2023-04-29 DIAGNOSIS — R0609 Other forms of dyspnea: Secondary | ICD-10-CM

## 2023-04-29 DIAGNOSIS — I10 Essential (primary) hypertension: Secondary | ICD-10-CM

## 2023-04-29 DIAGNOSIS — I3139 Other pericardial effusion (noninflammatory): Secondary | ICD-10-CM

## 2023-04-29 NOTE — Patient Instructions (Signed)

## 2023-04-29 NOTE — Progress Notes (Unsigned)
Cardiology Office Note:    Date:  04/29/2023   ID:  Verlee Rossetti, DOB [birth year], MRN [id number]  PCP:  Krystal Clark, NP  Cardiologist:  Gypsy Balsam, MD    Referring MD: Rhea Bleacher*   Chief Complaint  Patient presents with   Follow-up    History of Present Illness:    Colleen Watkins is a 89 y.o. female with past medical history significant for CVA likely she completely recover, pericardial effusion which was moderate but never hemodynamically significant, essential hypertension, obstructive sleep apnea, coronary disease in form of calcification of the coronary but stress test was normal.  She comes today to months for follow-up.  Overall she is doing very well.  Recently she had hernia surgery done, went to surgery with no difficulties doing well  Past Medical History:  Diagnosis Date   Abdominal pain 06/29/2019   Anemia 07/01/2020   Anxiety    Arthritis    Cancer (HCC)    skin   Chronic pain of left knee 01/21/2017   Complication of anesthesia    Coronary artery disease involving native coronary artery of native heart without angina pectoris 05/04/2015   No symptoms, calcification noted the CT   Cyst of ovary 06/29/2019   Dyspnea on exertion 01/28/2019   GERD (gastroesophageal reflux disease)    occ   History of GI bleed 05/04/2015   History of stroke 08/25/2020   Formatting of this note might be different from the original. Left side of pons   HTN (hypertension) 04/21/2013   Hypertension    Late effect of cerebrovascular accident (CVA) 08/31/2020   Malaise and fatigue 06/30/2020   Obstructive sleep apnea syndrome 06/30/2020   Pericardial effusion 05/04/2015   Moderate by last echocardiogram   Pes anserinus bursitis of right knee 12/29/2019   PONV (postoperative nausea and vomiting)    Recurrent UTI 09/28/2020   S/P lumbar laminectomy 02/17/2015   S/P TKR (total knee replacement) using cement, left 09/20/2021   Stroke (HCC) 08/13/2020    Swelling of both lower extremities 01/28/2019   Trochanteric bursitis of right hip 12/29/2019   UTI (urinary tract infection)     Past Surgical History:  Procedure Laterality Date   ANKLE FRACTURE SURGERY Right    25 yrs ago   APPENDECTOMY     BREAST SURGERY Left 08/13/2009   removed nodule-benign   FOOT SURGERY Right    bunion 20 yrs ago   KNEE ARTHROSCOPY Left 08/13/1998   LAPAROTOMY  08/13/1946   intestinal tortion   LUMBAR LAMINECTOMY/DECOMPRESSION MICRODISCECTOMY N/A 06/04/2013   Procedure: LUMBAR LAMINECTOMY/DECOMPRESSION MICRODISCECTOMY 1 LEVEL;  Surgeon: Tia Alert, MD;  Location: [facility];  Service: Neurosurgery;  Laterality: N/A;  LUMBAR LAMINECTOMY/DECOMPRESSION MICRODISCECTOMY 1 LEVEL   LUMBAR LAMINECTOMY/DECOMPRESSION MICRODISCECTOMY Left 02/17/2015   Procedure: LUMBAR LAMINECTOMY/DECOMPRESSION MICRODISCECTOMY LUMBAR THREE FOUR;  Surgeon: Tia Alert, MD;  Location: [facility];  Service: Neurosurgery;  Laterality: Left;   TOTAL KNEE ARTHROPLASTY Left 09/18/2021   Procedure: TOTAL KNEE ARTHROPLASTY;  Surgeon: Dannielle Huh, MD;  Location: [facility];  Service: Orthopedics;  Laterality: Left;    Current Medications: Current Meds  Medication Sig   amLODipine (NORVASC) 5 MG tablet Take 5 mg by mouth in the morning.   aspirin EC 325 MG EC tablet Take 1 tablet (325 mg total) by mouth 2 (two) times daily. (Patient taking differently: Take 325 mg by mouth daily.)   atorvastatin (LIPITOR) 40 MG tablet Take 40 mg by mouth at bedtime.  Cholecalciferol (VITAMIN D-3) 125 MCG (5000 UT) TABS Take 5,000 Units by mouth in the morning.   cyanocobalamin (,VITAMIN B-12,) 1000 MCG/ML injection Inject 1,000 mcg into the muscle every 30 (thirty) days.   escitalopram (LEXAPRO) 10 MG tablet Take 10 mg by mouth at bedtime.   furosemide (LASIX) 40 MG tablet Take 40 mg by mouth in the morning.   labetalol (NORMODYNE) 200 MG tablet Take 200 mg by mouth 2 (two) times daily.   losartan  (COZAAR) 100 MG tablet Take 100 mg by mouth at bedtime.   pantoprazole (PROTONIX) 40 MG tablet Take 40 mg by mouth in the morning.     Allergies:   Oxycodone, Penicillins, Codeine, Hydrocortisone, and Sulfa antibiotics   Social History   Socioeconomic History   Marital status: Widowed    Spouse name: Not on file   Number of children: Not on file   Years of education: Not on file   Highest education level: Not on file  Occupational History   Not on file  Tobacco Use   Smoking status: Never   Smokeless tobacco: Never  Vaping Use   Vaping status: Never Used  Substance and Sexual Activity   Alcohol use: No   Drug use: No   Sexual activity: Not on file  Other Topics Concern   Not on file  Social History Narrative   Not on file   Social Determinants of Health   Financial Resource Strain: Not on file  Food Insecurity: Low Risk  (03/04/2023)   Received from [facility]   Hunger Vital Sign    Worried About Running Out of Food in the Last Year: Never true    Ran Out of Food in the Last Year: Never true  Transportation Needs: No Transportation Needs (03/04/2023)   Received from [facility]    In the past 12 months, has lack of reliable transportation kept you from medical appointments, meetings, work or from getting things needed for daily living? : No  Physical Activity: Not on file  Stress: Not on file  Social Connections: Colleen (12/26/2021)   Received from [facility]   Social Network    Social Network: Not on file     Family History: The patient's family history includes Breast cancer in her daughter; Diabetes in her sister; Ovarian cancer in her mother. ROS:   Please see the history of present illness.    All 14 point review of systems negative except as described per history of present illness  EKGs/Labs/Other Studies Reviewed:         Recent Labs: 08/24/2022: BUN 28; Creatinine, Ser 1.21; NT-Pro BNP 450; Potassium 4.7;  Sodium 144  Recent Lipid Panel No results found for: "CHOL", "TRIG", "HDL", "CHOLHDL", "VLDL", "LDLCALC", "LDLDIRECT"  Physical Exam:    VS:  BP 126/60 (BP Location: Left Arm, Patient Position: Sitting)   Pulse 82   Ht 5\' 2"  (1.575 m)   Wt 132 lb 6.4 oz (60.1 kg)   SpO2 94%   BMI 24.22 kg/m     Wt Readings from Last 3 Encounters:  04/29/23 132 lb 6.4 oz (60.1 kg)  08/24/22 130 lb (59 kg)  12/27/21 141 lb (64 kg)     GEN:  Well nourished, well developed in no acute distress HEENT: Normal NECK: No JVD; No carotid bruits LYMPHATICS: No lymphadenopathy CARDIAC: RRR, no murmurs, no rubs, no gallops RESPIRATORY:  Clear to auscultation without rales, wheezing or rhonchi  ABDOMEN: Soft, non-tender, non-distended  MUSCULOSKELETAL:  No edema; No deformity  SKIN: Warm and dry LOWER EXTREMITIES: no swelling NEUROLOGIC:  Alert and oriented x 3 PSYCHIATRIC:  Normal affect   ASSESSMENT:    1. Coronary artery disease involving native coronary artery of native heart without angina pectoris   2. Pericardial effusion   3. Primary hypertension   4. Dyspnea on exertion    PLAN:    In order of problems listed above:  Coronary disease stable from that point review on guideline directed medical therapy which I will continue. Pericardial effusion ask her to have another echocardiogram done to check on that. Essential hypertension: Blood pressure well-controlled continue present management. Dyslipidemia I did review K PN which show me LDL 92 HDL 62 this is from 2020 will call primary care physician to get more updated data about her cholesterol   Medication Adjustments/Labs and Tests Ordered: Current medicines are reviewed at length with the patient today.  Concerns regarding medicines are outlined above.  No orders of the defined types were placed in this encounter.  Medication changes: No orders of the defined types were placed in this encounter.   Signed, Georgeanna Lea, MD,  [facility] 04/29/2023 2:39 PM    [facility]

## 2023-05-29 ENCOUNTER — Other Ambulatory Visit: Payer: Self-pay | Admitting: Family Medicine

## 2023-05-31 ENCOUNTER — Telehealth: Payer: Self-pay

## 2023-05-31 ENCOUNTER — Ambulatory Visit: Payer: Medicare PPO | Attending: Cardiology

## 2023-05-31 DIAGNOSIS — R0609 Other forms of dyspnea: Secondary | ICD-10-CM | POA: Diagnosis not present

## 2023-05-31 LAB — ECHOCARDIOGRAM COMPLETE
AR max vel: 2.84 cm2
AV Area VTI: 2.86 cm2
AV Area mean vel: 2.75 cm2
AV Mean grad: 6.5 mm[Hg]
AV Peak grad: 11.6 mm[Hg]
Ao pk vel: 1.7 m/s
Area-P 1/2: 2.51 cm2
MV M vel: 3.26 m/s
MV Peak grad: 42.5 mm[Hg]
S' Lateral: 2.5 cm

## 2023-05-31 NOTE — Telephone Encounter (Signed)
Spoke with daughter per Noland Fordyce regarding Dr. Madireddy's note:  Patient with outpatient echo today showing moderate to large circumferential pericardial effusion, no obvious tamponade physiology, IVC variation greater than 50%. Last echo available is from 2022.  Dr. Vanetta Shawl patient. Outpatient echo today 11 mins If she is symptomatic should head to the ER.  If stable, should come in for a visit with Dr. Bing Matter if available early next week and a repeat echocardiogram limited study Monday or Tuesday at our office."  She stated that the pt was the same and having no chest pain of shortness of breath. She stated that she was moving to an assisted living community. She was agreeable to following up with Dr. Bing Matter on Monday or Tuesday of next week. Will send not to front desk to call her daughter [name] to schedule. She is aware to call 911 or go to the ED if pt has any chest pain, shortness of breath or any changes in her condition.

## 2023-06-04 ENCOUNTER — Ambulatory Visit: Payer: Medicare PPO | Attending: Cardiology | Admitting: Cardiology

## 2023-06-04 ENCOUNTER — Encounter: Payer: Self-pay | Admitting: Cardiology

## 2023-06-04 VITALS — BP 110/60 | HR 76 | Ht 61.0 in | Wt 132.0 lb

## 2023-06-04 DIAGNOSIS — I2510 Atherosclerotic heart disease of native coronary artery without angina pectoris: Secondary | ICD-10-CM

## 2023-06-04 DIAGNOSIS — I3139 Other pericardial effusion (noninflammatory): Secondary | ICD-10-CM

## 2023-06-04 DIAGNOSIS — I6930 Unspecified sequelae of cerebral infarction: Secondary | ICD-10-CM

## 2023-06-04 DIAGNOSIS — R0609 Other forms of dyspnea: Secondary | ICD-10-CM | POA: Diagnosis not present

## 2023-06-04 DIAGNOSIS — I10 Essential (primary) hypertension: Secondary | ICD-10-CM | POA: Diagnosis not present

## 2023-06-04 NOTE — Progress Notes (Signed)
Cardiology Office Note:    Date:  06/04/2023   ID:  Colleen Watkins, DOB November 07, 1932, MRN [id number]  PCP:  Krystal Clark, NP  Cardiologist:  Gypsy Balsam, MD    Referring MD: Rhea Bleacher*   Chief Complaint  Patient presents with   Follow-up    History of Present Illness:    Colleen Watkins is a 89 y.o. female  Medical history significant for CVA likely completely recover, pericardial effusion that was assessed as moderate but hemodynamically insignificant, essential hypertension, obstructive sleep apnea coronary artery disease in form of calcification but stress test was normal.  She comes in today after hide last echocardiogram the done which showed the presence of moderate pericardial effusion may be slightly larger than before.  She is asymptomatic she does have chronic swelling of lower extremities does not much shortness of breath I do not see any pulses paradoxus on the physical exam  Past Medical History:  Diagnosis Date   Abdominal pain 06/29/2019   Anemia 07/01/2020   Anxiety    Arthritis    Cancer (HCC)    skin   Chronic pain of left knee 01/21/2017   Complication of anesthesia    Coronary artery disease involving native coronary artery of native heart without angina pectoris 05/04/2015   No symptoms, calcification noted the CT   Cyst of ovary 06/29/2019   Dyspnea on exertion 01/28/2019   GERD (gastroesophageal reflux disease)    occ   History of GI bleed 05/04/2015   History of stroke 08/25/2020   Formatting of this note might be different from the original. Left side of pons   HTN (hypertension) 04/21/2013   Hypertension    Late effect of cerebrovascular accident (CVA) 08/31/2020   Malaise and fatigue 06/30/2020   Obstructive sleep apnea syndrome 06/30/2020   Pericardial effusion 05/04/2015   Moderate by last echocardiogram   Pes anserinus bursitis of right knee 12/29/2019   PONV (postoperative nausea and vomiting)    Recurrent UTI  09/28/2020   S/P lumbar laminectomy 02/17/2015   S/P TKR (total knee replacement) using cement, left 09/20/2021   Stroke (HCC) 08/13/2020   Swelling of both lower extremities 01/28/2019   Trochanteric bursitis of right hip 12/29/2019   UTI (urinary tract infection)     Past Surgical History:  Procedure Laterality Date   ANKLE FRACTURE SURGERY Right    25 yrs ago   APPENDECTOMY     BREAST SURGERY Left 08/13/2009   removed nodule-benign   FOOT SURGERY Right    bunion 20 yrs ago   KNEE ARTHROSCOPY Left 08/13/1998   LAPAROTOMY  08/13/1946   intestinal tortion   LUMBAR LAMINECTOMY/DECOMPRESSION MICRODISCECTOMY N/A 06/04/2013   Procedure: LUMBAR LAMINECTOMY/DECOMPRESSION MICRODISCECTOMY 1 LEVEL;  Surgeon: Tia Alert, MD;  Location: [facility];  Service: Neurosurgery;  Laterality: N/A;  LUMBAR LAMINECTOMY/DECOMPRESSION MICRODISCECTOMY 1 LEVEL   LUMBAR LAMINECTOMY/DECOMPRESSION MICRODISCECTOMY Left 02/17/2015   Procedure: LUMBAR LAMINECTOMY/DECOMPRESSION MICRODISCECTOMY LUMBAR THREE FOUR;  Surgeon: Tia Alert, MD;  Location: [facility];  Service: Neurosurgery;  Laterality: Left;   TOTAL KNEE ARTHROPLASTY Left 09/18/2021   Procedure: TOTAL KNEE ARTHROPLASTY;  Surgeon: Dannielle Huh, MD;  Location: [facility];  Service: Orthopedics;  Laterality: Left;    Current Medications: Current Meds  Medication Sig   amLODipine (NORVASC) 5 MG tablet Take 5 mg by mouth in the morning.   aspirin EC 325 MG EC tablet Take 1 tablet (325 mg total) by mouth 2 (two) times daily. (Patient taking differently:  Take 325 mg by mouth daily.)   atorvastatin (LIPITOR) 40 MG tablet Take 40 mg by mouth at bedtime.   Cholecalciferol (VITAMIN D-3) 125 MCG (5000 UT) TABS Take 5,000 Units by mouth in the morning.   cyanocobalamin (,VITAMIN B-12,) 1000 MCG/ML injection Inject 1,000 mcg into the muscle every 30 (thirty) days.   escitalopram (LEXAPRO) 10 MG tablet Take 10 mg by mouth at bedtime.   furosemide (LASIX) 40 MG  tablet Take 40 mg by mouth in the morning.   labetalol (NORMODYNE) 200 MG tablet Take 200 mg by mouth 2 (two) times daily.   losartan (COZAAR) 100 MG tablet Take 100 mg by mouth at bedtime.   pantoprazole (PROTONIX) 40 MG tablet Take 40 mg by mouth in the morning.     Allergies:   Oxycodone, Penicillins, Codeine, Hydrocortisone, and Sulfa antibiotics   Social History   Socioeconomic History   Marital status: Widowed    Spouse name: Not on file   Number of children: Not on file   Years of education: Not on file   Highest education level: Not on file  Occupational History   Not on file  Tobacco Use   Smoking status: Never   Smokeless tobacco: Never  Vaping Use   Vaping status: Never Used  Substance and Sexual Activity   Alcohol use: No   Drug use: No   Sexual activity: Not on file  Other Topics Concern   Not on file  Social History Narrative   Not on file   Social Determinants of Health   Financial Resource Strain: Not on file  Food Insecurity: Low Risk  (03/04/2023)   Received from [facility]   Hunger Vital Sign    Worried About Running Out of Food in the Last Year: Never true    Ran Out of Food in the Last Year: Never true  Transportation Needs: No Transportation Needs (03/04/2023)   Received from [facility]    In the past 12 months, has lack of reliable transportation kept you from medical appointments, meetings, work or from getting things needed for daily living? : No  Physical Activity: Not on file  Stress: Not on file  Social Connections: Unknown (12/26/2021)   Received from [facility], [facility]   Social Network    Social Network: Not on file     Family History: The patient's family history includes Breast cancer in her daughter; Diabetes in her sister; Ovarian cancer in her mother. ROS:   Please see the history of present illness.    All 14 point review of systems negative except as described per history of present  illness  EKGs/Labs/Other Studies Reviewed:         Recent Labs: 08/24/2022: BUN 28; Creatinine, Ser 1.21; NT-Pro BNP 450; Potassium 4.7; Sodium 144  Recent Lipid Panel No results found for: "CHOL", "TRIG", "HDL", "CHOLHDL", "VLDL", "LDLCALC", "LDLDIRECT"  Physical Exam:    VS:  BP 110/60 (BP Location: Right Arm, Patient Position: Sitting)   Pulse 76   Ht 5\' 1"  (1.549 m)   Wt 132 lb (59.9 kg)   SpO2 96%   BMI 24.94 kg/m     Wt Readings from Last 3 Encounters:  06/04/23 132 lb (59.9 kg)  04/29/23 132 lb 6.4 oz (60.1 kg)  08/24/22 130 lb (59 kg)     GEN:  Well nourished, well developed in no acute distress HEENT: Normal NECK: No JVD; No carotid bruits LYMPHATICS: No lymphadenopathy CARDIAC: RRR, no  murmurs, no rubs, no gallops RESPIRATORY:  Clear to auscultation without rales, wheezing or rhonchi  ABDOMEN: Soft, non-tender, non-distended MUSCULOSKELETAL: 1+ edema; No deformity  SKIN: Warm and dry LOWER EXTREMITIES: no swelling NEUROLOGIC:  Alert and oriented x 3 PSYCHIATRIC:  Normal affect   ASSESSMENT:    1. Coronary artery disease involving native coronary artery of native heart without angina pectoris   2. Primary hypertension   3. Pericardial effusion   4. Dyspnea on exertion   5. Late effect of cerebrovascular accident (CVA)    PLAN:    In order of problems listed above:  Pericardial effusion appears to be slightly larger than before.  But it looks like hemodynamically insignificant, we will get sed rate as well as C-reactive protein if those are abnormal we try some colchicine.  Until then we will just simply keep monitoring it.  No signs of tamponade.  Pulses paradoxus only 10 no JVD while laying flat Essential hypertension: Blood pressure well-controlled continue present management. Dyslipidemia she is taking Lipitor 40, LDL 92 HDL 62 we will continue present management. Dyspnea exertion denies having any   Medication Adjustments/Labs and Tests  Ordered: Current medicines are reviewed at length with the patient today.  Concerns regarding medicines are outlined above.  No orders of the defined types were placed in this encounter.  Medication changes: No orders of the defined types were placed in this encounter.   Signed, Georgeanna Lea, MD, [facility] 06/04/2023 1:22 PM    [facility]

## 2023-06-04 NOTE — Patient Instructions (Signed)
Medication Instructions:  Your physician recommends that you continue on your current medications as directed. Please refer to the Current Medication list given to you today.  *If you need a refill on your cardiac medications before your next appointment, please call your pharmacy*   Lab Work: Sed Rate, C-reactive Protein-today If you have labs (blood work) drawn today and your tests are completely normal, you will receive your results only by: MyChart Message (if you have MyChart) OR A paper copy in the mail If you have any lab test that is abnormal or we need to change your treatment, we will call you to review the results.   Testing/Procedures: None Ordered   Follow-Up: At [facility], you and your health needs are our priority.  As part of our continuing mission to provide you with exceptional heart care, we have created designated Provider Care Teams.  These Care Teams include your primary Cardiologist (physician) and Advanced Practice Providers (APPs -  Physician Assistants and Nurse Practitioners) who all work together to provide you with the care you need, when you need it.  We recommend signing up for the patient portal called "MyChart".  Sign up information is provided on this After Visit Summary.  MyChart is used to connect with patients for Virtual Visits (Telemedicine).  Patients are able to view lab/test results, encounter notes, upcoming appointments, etc.  Non-urgent messages can be sent to your provider as well.   To learn more about what you can do with MyChart, go to [email or web address].    Your next appointment:   5 month(s)  The format for your next appointment:   In Person  Provider:   Gypsy Balsam, MD    Other Instructions NA

## 2023-06-04 NOTE — Addendum Note (Signed)
Addended by: Baldo Ash D on: 06/04/2023 01:35 PM   Modules accepted: Orders

## 2023-06-05 LAB — C-REACTIVE PROTEIN: CRP: 1 mg/L (ref 0–10)

## 2023-06-05 LAB — SEDIMENTATION RATE: Sed Rate: 6 mm/h (ref 0–40)

## 2023-06-10 ENCOUNTER — Telehealth: Payer: Self-pay | Admitting: Cardiology

## 2023-06-10 NOTE — Telephone Encounter (Signed)
Spoke with Pts daughter [name]. Gave lab results. [name] stated understanding.

## 2023-06-10 NOTE — Telephone Encounter (Signed)
Daughter [name]) called to follow-up on patient's test results.

## 2023-06-11 ENCOUNTER — Telehealth: Payer: Self-pay

## 2023-06-11 NOTE — Telephone Encounter (Signed)
-----   Message from Gypsy Balsam sent at 06/07/2023  1:41 PM EDT ----- All markers of inflammation are normal.  No need to do any medication right now

## 2023-06-11 NOTE — Telephone Encounter (Signed)
Spoke with Mrs. Colleen Watkins, she's informed me someone here  has informed her of results

## 2023-06-18 DIAGNOSIS — R269 Unspecified abnormalities of gait and mobility: Secondary | ICD-10-CM | POA: Insufficient documentation

## 2023-11-01 ENCOUNTER — Ambulatory Visit: Payer: Medicare PPO | Attending: Cardiology | Admitting: Cardiology

## 2023-11-01 ENCOUNTER — Encounter: Payer: Self-pay | Admitting: Cardiology

## 2023-11-01 VITALS — BP 114/64 | HR 68 | Ht 61.0 in | Wt 132.2 lb

## 2023-11-01 DIAGNOSIS — M7989 Other specified soft tissue disorders: Secondary | ICD-10-CM | POA: Diagnosis not present

## 2023-11-01 DIAGNOSIS — I10 Essential (primary) hypertension: Secondary | ICD-10-CM | POA: Diagnosis not present

## 2023-11-01 DIAGNOSIS — I3139 Other pericardial effusion (noninflammatory): Secondary | ICD-10-CM | POA: Diagnosis not present

## 2023-11-01 DIAGNOSIS — I2510 Atherosclerotic heart disease of native coronary artery without angina pectoris: Secondary | ICD-10-CM

## 2023-11-01 DIAGNOSIS — R0609 Other forms of dyspnea: Secondary | ICD-10-CM

## 2023-11-01 NOTE — Patient Instructions (Signed)

## 2023-11-01 NOTE — Progress Notes (Signed)
 Cardiology Office Note:    Date:  11/01/2023   ID:  Colleen Watkins, DOB 1933-05-19, MRN [id number]  PCP:  Krystal Clark, NP  Cardiologist:  Gypsy Balsam, MD    Referring MD: Rhea Bleacher*   Chief Complaint  Patient presents with   Follow-up    History of Present Illness:    Colleen Watkins is a 89 y.o. female past medical history significant for CVA likely she completely recovered, pericardial effusion which is moderate hemodynamically insignificant no evidence of tamponade, essential hypertension, obstructive sleep apnea, coronary artery disease in form of calcification however stress test is normal. Comes today to months for follow-up overall doing very well.  [place] Doing Great No Chest Pain Tightness Squeezing Pressure Burning Chest No Swelling of Lower Extremities More Than Usual.  Past Medical History:  Diagnosis Date   Abdominal pain 06/29/2019   Anemia 07/01/2020   Anxiety    Arthritis    Cancer (HCC)    skin   Chronic pain of left knee 01/21/2017   Complication of anesthesia    Coronary artery disease involving native coronary artery of native heart without angina pectoris 05/04/2015   No symptoms, calcification noted the CT   Cyst of ovary 06/29/2019   Dyspnea on exertion 01/28/2019   GERD (gastroesophageal reflux disease)    occ   History of GI bleed 05/04/2015   History of stroke 08/25/2020   Formatting of this note might be different from the original. Left side of pons   HTN (hypertension) 04/21/2013   Hypertension    Late effect of cerebrovascular accident (CVA) 08/31/2020   Malaise and fatigue 06/30/2020   Obstructive sleep apnea syndrome 06/30/2020   Pericardial effusion 05/04/2015   Moderate by last echocardiogram   Pes anserinus bursitis of right knee 12/29/2019   PONV (postoperative nausea and vomiting)    Recurrent UTI 09/28/2020   S/P lumbar laminectomy 02/17/2015   S/P TKR (total knee  replacement) using cement, left 09/20/2021   Stroke (HCC) 08/13/2020   Swelling of both lower extremities 01/28/2019   Trochanteric bursitis of right hip 12/29/2019   UTI (urinary tract infection)     Past Surgical History:  Procedure Laterality Date   ANKLE FRACTURE SURGERY Right    25 yrs ago   APPENDECTOMY     BREAST SURGERY Left 08/13/2009   removed nodule-benign   FOOT SURGERY Right    bunion 20 yrs ago   KNEE ARTHROSCOPY Left 08/13/1998   LAPAROTOMY  08/13/1946   intestinal tortion   LUMBAR LAMINECTOMY/DECOMPRESSION MICRODISCECTOMY N/A 06/04/2013   Procedure: LUMBAR LAMINECTOMY/DECOMPRESSION MICRODISCECTOMY 1 LEVEL;  Surgeon: Tia Alert, MD;  Location: [facility];  Service: Neurosurgery;  Laterality: N/A;  LUMBAR LAMINECTOMY/DECOMPRESSION MICRODISCECTOMY 1 LEVEL   LUMBAR LAMINECTOMY/DECOMPRESSION MICRODISCECTOMY Left 02/17/2015   Procedure: LUMBAR LAMINECTOMY/DECOMPRESSION MICRODISCECTOMY LUMBAR THREE FOUR;  Surgeon: Tia Alert, MD;  Location: [facility];  Service: Neurosurgery;  Laterality: Left;   TOTAL KNEE ARTHROPLASTY Left 09/18/2021   Procedure: TOTAL KNEE ARTHROPLASTY;  Surgeon: Dannielle Huh, MD;  Location: [facility];  Service: Orthopedics;  Laterality: Left;    Current Medications: Current Meds  Medication Sig   amLODipine (NORVASC) 5 MG tablet Take 5 mg by mouth in the morning.   aspirin EC 325 MG EC tablet Take 1 tablet (325 mg total) by mouth 2 (two) times daily. (Patient taking differently: Take 325 mg by mouth daily.)   atorvastatin (LIPITOR) 40 MG tablet Take 40 mg by  mouth at bedtime.   Cholecalciferol (VITAMIN D-3) 125 MCG (5000 UT) TABS Take 5,000 Units by mouth in the morning.   cyanocobalamin (,VITAMIN B-12,) 1000 MCG/ML injection Inject 1,000 mcg into the muscle every 30 (thirty) days.   escitalopram (LEXAPRO) 10 MG tablet Take 10 mg by mouth at bedtime.   furosemide (LASIX) 40 MG tablet Take 40 mg by mouth in the morning.   labetalol (NORMODYNE)  200 MG tablet Take 200 mg by mouth 2 (two) times daily.   losartan (COZAAR) 100 MG tablet Take 100 mg by mouth at bedtime.   pantoprazole (PROTONIX) 40 MG tablet Take 40 mg by mouth in the morning.     Allergies:   Oxycodone, Penicillins, Cephalexin, Codeine, Hydrocortisone, and Sulfa antibiotics   Social History   Socioeconomic History   Marital status: Widowed    Spouse name: Not on file   Number of children: Not on file   Years of education: Not on file   Highest education level: Not on file  Occupational History   Not on file  Tobacco Use   Smoking status: Never   Smokeless tobacco: Never  Vaping Use   Vaping status: Never Used  Substance and Sexual Activity   Alcohol use: No   Drug use: No   Sexual activity: Not on file  Other Topics Concern   Not on file  Social History Narrative   Not on file   Social Drivers of Health   Financial Resource Strain: Not on file  Food Insecurity: Low Risk  (03/04/2023)   Received from [facility]   Hunger Vital Sign    Worried About Running Out of Food in the Last Year: Never true    Ran Out of Food in the Last Year: Never true  Transportation Needs: No Transportation Needs (03/04/2023)   Received from [facility]    In the past 12 months, has lack of reliable transportation kept you from medical appointments, meetings, work or from getting things needed for daily living? : No  Physical Activity: Not on file  Stress: Not on file  Social Connections: Unknown (12/26/2021)   Received from [facility], [facility]   Social Network    Social Network: Not on file     Family History: The patient's family history includes Breast cancer in her daughter; Diabetes in her sister; Ovarian cancer in her mother. ROS:   Please see the history of present illness.    All 14 point review of systems negative except as described per history of present illness  EKGs/Labs/Other Studies Reviewed:    EKG  Interpretation Date/Time:  Friday November 01 2023 13:10:03 EDT Ventricular Rate:  68 PR Interval:  210 QRS Duration:  128 QT Interval:  432 QTC Calculation: 459 R Axis:   -68  Text Interpretation: Sinus rhythm with 1st degree A-V block Left axis deviation Right bundle branch block Possible Lateral infarct , age undetermined Abnormal ECG When compared with ECG of 27-May-2013 14:53, Borderline criteria for Lateral infarct are now Present Confirmed by Gypsy Balsam [phone]) on 11/01/2023 1:19:47 PM    Recent Labs: No results found for requested labs within last 365 days.  Recent Lipid Panel No results found for: "CHOL", "TRIG", "HDL", "CHOLHDL", "VLDL", "LDLCALC", "LDLDIRECT"  Physical Exam:    VS:  BP 114/64 (BP Location: Right Arm, Patient Position: Sitting)   Pulse 68   Ht 5\' 1"  (1.549 m)   Wt 132 lb 3.2 oz (60 kg)   SpO2  94%   BMI 24.98 kg/m     Wt Readings from Last 3 Encounters:  11/01/23 132 lb 3.2 oz (60 kg)  06/04/23 132 lb (59.9 kg)  04/29/23 132 lb 6.4 oz (60.1 kg)     GEN:  Well nourished, well developed in no acute distress HEENT: Normal NECK: No JVD; No carotid bruits LYMPHATICS: No lymphadenopathy CARDIAC: RRR, no murmurs, no rubs, no gallops RESPIRATORY:  Clear to auscultation without rales, wheezing or rhonchi  ABDOMEN: Soft, non-tender, non-distended MUSCULOSKELETAL:  No edema; No deformity  SKIN: Warm and dry LOWER EXTREMITIES: no swelling NEUROLOGIC:  Alert and oriented x 3 PSYCHIATRIC:  Normal affect   ASSESSMENT:    1. Primary hypertension   2. Coronary artery disease involving native coronary artery of native heart without angina pectoris   3. Pericardial effusion   4. Swelling of both lower extremities    PLAN:    In order of problems listed above:  Pericardial effusion no signs and symptoms of tamponade, no JVD enlargement, will do echocardiogram to reassess the amount of fluid. Essential hypertension blood pressure well-controlled  continue present management. Dyslipidemia I did review K PN LDL 92 HDL 62 is not controlled for somebody her age. Swelling of lower extremities unchanged again will get echocardiogram to assess ejection fraction   Medication Adjustments/Labs and Tests Ordered: Current medicines are reviewed at length with the patient today.  Concerns regarding medicines are outlined above.  Orders Placed This Encounter  Procedures   EKG 12-Lead   Medication changes: No orders of the defined types were placed in this encounter.   Signed, Georgeanna Lea, MD, [facility] 11/01/2023 1:30 PM    [facility]

## 2023-11-21 ENCOUNTER — Ambulatory Visit: Attending: Cardiology

## 2023-11-21 DIAGNOSIS — R0609 Other forms of dyspnea: Secondary | ICD-10-CM | POA: Diagnosis not present

## 2023-11-22 LAB — ECHOCARDIOGRAM COMPLETE
AR max vel: 1.8 cm2
AV Area VTI: 2.07 cm2
AV Area mean vel: 1.96 cm2
AV Mean grad: 10.5 mmHg
AV Peak grad: 20.8 mmHg
AV Vena cont: 0.3 cm
Ao pk vel: 2.28 m/s
Area-P 1/2: 2.66 cm2
S' Lateral: 2.6 cm

## 2023-11-27 ENCOUNTER — Telehealth: Payer: Self-pay

## 2023-11-27 NOTE — Telephone Encounter (Signed)
 Results reviewed with pt's daughter Mariah Shines per DPR as per Dr. Tonja Fray note.  Daughter verbalized understanding and had no additional questions. Routed to PCP

## 2023-11-27 NOTE — Telephone Encounter (Signed)
 LVM to call regarding lab results
